# Patient Record
Sex: Male | Born: 1950
Health system: Southern US, Community
[De-identification: ages and names within clinical notes are randomized; demographics above are authoritative.]

## PROBLEM LIST (undated history)

## (undated) DIAGNOSIS — Z87828 Personal history of other (healed) physical injury and trauma: Secondary | ICD-10-CM

## (undated) DIAGNOSIS — I2119 ST elevation (STEMI) myocardial infarction involving other coronary artery of inferior wall: Secondary | ICD-10-CM

## (undated) DIAGNOSIS — R972 Elevated prostate specific antigen [PSA]: Secondary | ICD-10-CM

## (undated) DIAGNOSIS — M1612 Unilateral primary osteoarthritis, left hip: Secondary | ICD-10-CM

## (undated) DIAGNOSIS — I10 Essential (primary) hypertension: Secondary | ICD-10-CM

## (undated) DIAGNOSIS — E785 Hyperlipidemia, unspecified: Secondary | ICD-10-CM

## (undated) DIAGNOSIS — I2510 Atherosclerotic heart disease of native coronary artery without angina pectoris: Secondary | ICD-10-CM

## (undated) DIAGNOSIS — Z87442 Personal history of urinary calculi: Secondary | ICD-10-CM

## (undated) HISTORY — DX: Unilateral primary osteoarthritis, left hip: M16.12

## (undated) HISTORY — DX: Hyperlipidemia, unspecified: E78.5

## (undated) HISTORY — DX: Elevated prostate specific antigen (PSA): R97.20

## (undated) HISTORY — DX: Essential (primary) hypertension: I10

## (undated) HISTORY — DX: Personal history of urinary calculi: Z87.442

## (undated) HISTORY — DX: Personal history of other (healed) physical injury and trauma: Z87.828

## (undated) HISTORY — DX: Gilbert syndrome: E80.4

## (undated) HISTORY — PX: CARDIAC CATHETERIZATION: SHX172

---

## 2001-01-30 ENCOUNTER — Encounter: Payer: Self-pay | Admitting: Internal Medicine

## 2001-01-30 ENCOUNTER — Encounter: Admission: RE | Admit: 2001-01-30 | Discharge: 2001-01-30 | Payer: Self-pay | Admitting: Internal Medicine

## 2002-01-25 HISTORY — PX: TOTAL HIP ARTHROPLASTY: SHX124

## 2002-01-31 ENCOUNTER — Encounter: Payer: Self-pay | Admitting: Orthopedic Surgery

## 2002-02-08 ENCOUNTER — Inpatient Hospital Stay (HOSPITAL_COMMUNITY): Admission: RE | Admit: 2002-02-08 | Discharge: 2002-02-13 | Payer: Self-pay | Admitting: Orthopedic Surgery

## 2002-02-08 ENCOUNTER — Encounter: Payer: Self-pay | Admitting: Orthopedic Surgery

## 2002-02-09 ENCOUNTER — Encounter: Payer: Self-pay | Admitting: Orthopedic Surgery

## 2002-11-20 ENCOUNTER — Encounter: Payer: Self-pay | Admitting: Internal Medicine

## 2004-09-18 ENCOUNTER — Ambulatory Visit: Payer: Self-pay | Admitting: Internal Medicine

## 2004-09-25 ENCOUNTER — Ambulatory Visit: Payer: Self-pay | Admitting: Internal Medicine

## 2004-10-26 ENCOUNTER — Ambulatory Visit: Payer: Self-pay | Admitting: Internal Medicine

## 2004-10-30 ENCOUNTER — Ambulatory Visit: Payer: Self-pay | Admitting: Internal Medicine

## 2005-06-25 ENCOUNTER — Ambulatory Visit: Payer: Self-pay | Admitting: Internal Medicine

## 2006-01-25 HISTORY — PX: TOTAL HIP ARTHROPLASTY: SHX124

## 2006-07-26 ENCOUNTER — Encounter: Payer: Self-pay | Admitting: Internal Medicine

## 2006-08-02 ENCOUNTER — Encounter (INDEPENDENT_AMBULATORY_CARE_PROVIDER_SITE_OTHER): Payer: Self-pay | Admitting: *Deleted

## 2006-08-02 ENCOUNTER — Encounter: Payer: Self-pay | Admitting: Internal Medicine

## 2006-08-25 ENCOUNTER — Telehealth (INDEPENDENT_AMBULATORY_CARE_PROVIDER_SITE_OTHER): Payer: Self-pay | Admitting: *Deleted

## 2006-09-12 ENCOUNTER — Ambulatory Visit: Payer: Self-pay | Admitting: Internal Medicine

## 2006-09-12 LAB — CONVERTED CEMR LAB
BUN: 13 mg/dL (ref 6–23)
Cholesterol: 224 mg/dL (ref 0–200)
Eosinophils Relative: 1.9 % (ref 0.0–5.0)
HCT: 45.4 % (ref 39.0–52.0)
HDL: 33.2 mg/dL — ABNORMAL LOW (ref >39.0)
Hemoglobin: 15.7 g/dL (ref 13.0–17.0)
Lymphocytes Relative: 32.1 % (ref 12.0–46.0)
Neutro Abs: 3.4 10*3/uL (ref 1.4–7.7)
RBC: 4.84 M/uL (ref 4.22–5.81)
RDW: 12.6 % (ref 11.5–14.6)
TSH: 0.99 microintl units/mL (ref 0.35–5.50)
Total CHOL/HDL Ratio: 6.7
Triglycerides: 230 mg/dL (ref 0–149)
VLDL: 46 mg/dL — ABNORMAL HIGH (ref 0–40)
WBC: 5.8 10*3/uL (ref 4.5–10.5)

## 2006-09-16 ENCOUNTER — Encounter (INDEPENDENT_AMBULATORY_CARE_PROVIDER_SITE_OTHER): Payer: Self-pay | Admitting: *Deleted

## 2006-09-16 ENCOUNTER — Ambulatory Visit: Payer: Self-pay | Admitting: Internal Medicine

## 2006-09-16 DIAGNOSIS — R972 Elevated prostate specific antigen [PSA]: Secondary | ICD-10-CM

## 2006-09-16 DIAGNOSIS — M2550 Pain in unspecified joint: Secondary | ICD-10-CM | POA: Insufficient documentation

## 2006-09-16 LAB — CONVERTED CEMR LAB
Cholesterol, target level: 200 mg/dL
LDL Goal: 160 mg/dL

## 2006-10-13 ENCOUNTER — Encounter: Payer: Self-pay | Admitting: Internal Medicine

## 2006-10-21 ENCOUNTER — Telehealth (INDEPENDENT_AMBULATORY_CARE_PROVIDER_SITE_OTHER): Payer: Self-pay | Admitting: *Deleted

## 2006-12-12 ENCOUNTER — Telehealth: Payer: Self-pay | Admitting: Internal Medicine

## 2006-12-12 ENCOUNTER — Ambulatory Visit: Payer: Self-pay | Admitting: Internal Medicine

## 2006-12-12 ENCOUNTER — Encounter (INDEPENDENT_AMBULATORY_CARE_PROVIDER_SITE_OTHER): Payer: Self-pay | Admitting: *Deleted

## 2006-12-12 LAB — CONVERTED CEMR LAB
ALT: 33 units/L (ref 0–53)
AST: 24 units/L (ref 0–37)
Direct LDL: 101.5 mg/dL
HDL: 25.5 mg/dL — ABNORMAL LOW (ref >39.0)
Triglycerides: 272 mg/dL (ref 0–149)
VLDL: 54 mg/dL — ABNORMAL HIGH (ref 0–40)

## 2006-12-13 ENCOUNTER — Ambulatory Visit: Payer: Self-pay | Admitting: Internal Medicine

## 2006-12-13 DIAGNOSIS — E782 Mixed hyperlipidemia: Secondary | ICD-10-CM | POA: Insufficient documentation

## 2006-12-13 DIAGNOSIS — M169 Osteoarthritis of hip, unspecified: Secondary | ICD-10-CM | POA: Insufficient documentation

## 2006-12-13 DIAGNOSIS — M1610 Unilateral primary osteoarthritis, unspecified hip: Secondary | ICD-10-CM | POA: Insufficient documentation

## 2007-01-05 ENCOUNTER — Inpatient Hospital Stay (HOSPITAL_COMMUNITY): Admission: RE | Admit: 2007-01-05 | Discharge: 2007-01-08 | Payer: Self-pay | Admitting: Orthopedic Surgery

## 2007-01-24 ENCOUNTER — Ambulatory Visit: Payer: Self-pay | Admitting: Internal Medicine

## 2007-01-24 LAB — CONVERTED CEMR LAB
Ketones, ur: NEGATIVE mg/dL
Protein, ur: NEGATIVE mg/dL
RBC / HPF: NONE SEEN (ref <3)
Urobilinogen, UA: 0.2 (ref 0.0–1.0)

## 2007-01-25 ENCOUNTER — Encounter: Payer: Self-pay | Admitting: Internal Medicine

## 2007-01-26 DIAGNOSIS — Z87828 Personal history of other (healed) physical injury and trauma: Secondary | ICD-10-CM

## 2007-01-26 HISTORY — DX: Personal history of other (healed) physical injury and trauma: Z87.828

## 2007-01-26 HISTORY — PX: LITHOTRIPSY: SUR834

## 2007-02-01 ENCOUNTER — Telehealth (INDEPENDENT_AMBULATORY_CARE_PROVIDER_SITE_OTHER): Payer: Self-pay | Admitting: *Deleted

## 2007-07-31 ENCOUNTER — Telehealth (INDEPENDENT_AMBULATORY_CARE_PROVIDER_SITE_OTHER): Payer: Self-pay | Admitting: *Deleted

## 2007-10-09 ENCOUNTER — Telehealth (INDEPENDENT_AMBULATORY_CARE_PROVIDER_SITE_OTHER): Payer: Self-pay | Admitting: *Deleted

## 2007-10-17 ENCOUNTER — Ambulatory Visit: Payer: Self-pay | Admitting: Internal Medicine

## 2007-10-17 DIAGNOSIS — F29 Unspecified psychosis not due to a substance or known physiological condition: Secondary | ICD-10-CM | POA: Insufficient documentation

## 2007-10-17 DIAGNOSIS — T6701XA Heatstroke and sunstroke, initial encounter: Secondary | ICD-10-CM

## 2007-10-17 DIAGNOSIS — I10 Essential (primary) hypertension: Secondary | ICD-10-CM

## 2007-10-18 ENCOUNTER — Encounter (INDEPENDENT_AMBULATORY_CARE_PROVIDER_SITE_OTHER): Payer: Self-pay | Admitting: *Deleted

## 2007-10-18 ENCOUNTER — Ambulatory Visit: Payer: Self-pay | Admitting: Internal Medicine

## 2007-10-19 ENCOUNTER — Ambulatory Visit: Payer: Self-pay | Admitting: Cardiology

## 2007-10-22 ENCOUNTER — Encounter (INDEPENDENT_AMBULATORY_CARE_PROVIDER_SITE_OTHER): Payer: Self-pay | Admitting: *Deleted

## 2007-10-22 LAB — CONVERTED CEMR LAB
ALT: 25 units/L (ref 0–53)
Albumin: 4.3 g/dL (ref 3.5–5.2)
Basophils Relative: 0.5 % (ref 0.0–3.0)
Cholesterol: 172 mg/dL (ref 0–200)
Eosinophils Absolute: 0.1 10*3/uL (ref 0.0–0.7)
Eosinophils Relative: 1.8 % (ref 0.0–5.0)
GFR calc non Af Amer: 92 mL/min
Glucose, Bld: 104 mg/dL — ABNORMAL HIGH (ref 70–99)
HCT: 46.3 % (ref 39.0–52.0)
HDL: 29.9 mg/dL — ABNORMAL LOW (ref >39.0)
MCV: 97.5 fL (ref 78.0–100.0)
Monocytes Relative: 6.9 % (ref 3.0–12.0)
Potassium: 4.7 meq/L (ref 3.5–5.1)
RBC: 4.75 M/uL (ref 4.22–5.81)
VLDL: 35 mg/dL (ref 0–40)

## 2007-10-23 ENCOUNTER — Telehealth: Payer: Self-pay | Admitting: Internal Medicine

## 2007-10-30 ENCOUNTER — Ambulatory Visit: Payer: Self-pay | Admitting: Internal Medicine

## 2007-10-30 DIAGNOSIS — R17 Unspecified jaundice: Secondary | ICD-10-CM | POA: Insufficient documentation

## 2007-10-30 DIAGNOSIS — L299 Pruritus, unspecified: Secondary | ICD-10-CM | POA: Insufficient documentation

## 2007-10-30 LAB — CONVERTED CEMR LAB
Bilirubin Urine: NEGATIVE
Ketones, urine, test strip: NEGATIVE
Specific Gravity, Urine: 1.02
WBC Urine, dipstick: NEGATIVE
pH: 5

## 2007-10-31 ENCOUNTER — Encounter (INDEPENDENT_AMBULATORY_CARE_PROVIDER_SITE_OTHER): Payer: Self-pay | Admitting: *Deleted

## 2007-10-31 LAB — CONVERTED CEMR LAB
ALT: 199 units/L — ABNORMAL HIGH (ref 0–53)
Albumin: 4 g/dL (ref 3.5–5.2)
Alkaline Phosphatase: 154 units/L — ABNORMAL HIGH (ref 39–117)
Basophils Absolute: 0 10*3/uL (ref 0.0–0.1)
Eosinophils Relative: 2 % (ref 0.0–5.0)
Hemoglobin: 14.6 g/dL (ref 13.0–17.0)
Hep A IgM: NEGATIVE
Monocytes Absolute: 0.7 10*3/uL (ref 0.1–1.0)
Monocytes Relative: 11 % (ref 3.0–12.0)
Neutrophils Relative %: 56 % (ref 43.0–77.0)
Platelets: 325 10*3/uL (ref 150–400)
Total Bilirubin: 3.6 mg/dL — ABNORMAL HIGH (ref 0.3–1.2)
Total Protein: 6.9 g/dL (ref 6.0–8.3)

## 2007-11-01 ENCOUNTER — Telehealth (INDEPENDENT_AMBULATORY_CARE_PROVIDER_SITE_OTHER): Payer: Self-pay | Admitting: *Deleted

## 2007-11-02 ENCOUNTER — Telehealth (INDEPENDENT_AMBULATORY_CARE_PROVIDER_SITE_OTHER): Payer: Self-pay | Admitting: *Deleted

## 2007-11-03 ENCOUNTER — Encounter: Admission: RE | Admit: 2007-11-03 | Discharge: 2007-11-03 | Payer: Self-pay | Admitting: Internal Medicine

## 2007-11-06 ENCOUNTER — Ambulatory Visit: Payer: Self-pay | Admitting: Internal Medicine

## 2007-11-08 ENCOUNTER — Encounter (INDEPENDENT_AMBULATORY_CARE_PROVIDER_SITE_OTHER): Payer: Self-pay | Admitting: *Deleted

## 2007-11-08 DIAGNOSIS — K8020 Calculus of gallbladder without cholecystitis without obstruction: Secondary | ICD-10-CM | POA: Insufficient documentation

## 2007-11-08 LAB — CONVERTED CEMR LAB
ALT: 91 units/L — ABNORMAL HIGH (ref 0–53)
AST: 41 units/L — ABNORMAL HIGH (ref 0–37)
Alkaline Phosphatase: 95 units/L (ref 39–117)
Lipase: 24 units/L (ref 11.0–59.0)

## 2007-11-09 ENCOUNTER — Telehealth (INDEPENDENT_AMBULATORY_CARE_PROVIDER_SITE_OTHER): Payer: Self-pay | Admitting: *Deleted

## 2007-11-09 ENCOUNTER — Encounter (INDEPENDENT_AMBULATORY_CARE_PROVIDER_SITE_OTHER): Payer: Self-pay | Admitting: *Deleted

## 2007-11-13 ENCOUNTER — Encounter: Payer: Self-pay | Admitting: Internal Medicine

## 2007-11-27 ENCOUNTER — Encounter: Payer: Self-pay | Admitting: Internal Medicine

## 2008-01-26 HISTORY — PX: COLONOSCOPY: SHX174

## 2008-10-15 ENCOUNTER — Telehealth (INDEPENDENT_AMBULATORY_CARE_PROVIDER_SITE_OTHER): Payer: Self-pay | Admitting: *Deleted

## 2008-10-18 ENCOUNTER — Ambulatory Visit: Payer: Self-pay | Admitting: Internal Medicine

## 2008-10-21 LAB — CONVERTED CEMR LAB
AST: 27 units/L (ref 0–37)
Alkaline Phosphatase: 48 units/L (ref 39–117)
BUN: 17 mg/dL (ref 6–23)
Basophils Absolute: 0 10*3/uL (ref 0.0–0.1)
Bilirubin, Direct: 0.1 mg/dL (ref 0.0–0.3)
Calcium: 9.2 mg/dL (ref 8.4–10.5)
Cholesterol: 193 mg/dL (ref 0–200)
Creatinine, Ser: 1 mg/dL (ref 0.4–1.5)
Eosinophils Relative: 1.8 % (ref 0.0–5.0)
HCT: 48.3 % (ref 39.0–52.0)
MCV: 97.9 fL (ref 78.0–100.0)
Monocytes Relative: 8.5 % (ref 3.0–12.0)
Neutrophils Relative %: 55.5 % (ref 43.0–77.0)
RBC: 4.93 M/uL (ref 4.22–5.81)
RDW: 12.2 % (ref 11.5–14.6)
Total CHOL/HDL Ratio: 6
Total Protein: 7.6 g/dL (ref 6.0–8.3)

## 2008-10-22 ENCOUNTER — Ambulatory Visit: Payer: Self-pay | Admitting: Internal Medicine

## 2008-10-22 ENCOUNTER — Encounter (INDEPENDENT_AMBULATORY_CARE_PROVIDER_SITE_OTHER): Payer: Self-pay | Admitting: *Deleted

## 2008-10-22 DIAGNOSIS — Z87442 Personal history of urinary calculi: Secondary | ICD-10-CM | POA: Insufficient documentation

## 2008-10-22 LAB — CONVERTED CEMR LAB: Hgb A1c MFr Bld: 5.5 % (ref 4.6–6.5)

## 2009-01-01 ENCOUNTER — Ambulatory Visit: Payer: Self-pay | Admitting: Internal Medicine

## 2009-01-25 HISTORY — PX: COLONOSCOPY: SHX174

## 2009-02-24 ENCOUNTER — Ambulatory Visit: Payer: Self-pay | Admitting: Internal Medicine

## 2009-03-03 LAB — CONVERTED CEMR LAB
AST: 29 units/L (ref 0–37)
Albumin: 4.5 g/dL (ref 3.5–5.2)
Bilirubin, Direct: 0.1 mg/dL (ref 0.0–0.3)
Cholesterol: 166 mg/dL (ref 0–200)
HDL: 37.4 mg/dL — ABNORMAL LOW (ref >39.00)
Total Bilirubin: 1.2 mg/dL (ref 0.3–1.2)
Total Protein: 7 g/dL (ref 6.0–8.3)
Triglycerides: 224 mg/dL — ABNORMAL HIGH (ref 0.0–149.0)

## 2009-07-24 ENCOUNTER — Ambulatory Visit: Payer: Self-pay | Admitting: Internal Medicine

## 2009-07-29 ENCOUNTER — Encounter: Payer: Self-pay | Admitting: Internal Medicine

## 2009-08-06 ENCOUNTER — Telehealth: Payer: Self-pay | Admitting: Internal Medicine

## 2009-10-15 ENCOUNTER — Ambulatory Visit: Payer: Self-pay | Admitting: Internal Medicine

## 2009-12-02 ENCOUNTER — Telehealth (INDEPENDENT_AMBULATORY_CARE_PROVIDER_SITE_OTHER): Payer: Self-pay | Admitting: *Deleted

## 2009-12-11 ENCOUNTER — Encounter: Payer: Self-pay | Admitting: Gastroenterology

## 2009-12-23 ENCOUNTER — Encounter (INDEPENDENT_AMBULATORY_CARE_PROVIDER_SITE_OTHER): Payer: Self-pay | Admitting: *Deleted

## 2009-12-24 ENCOUNTER — Ambulatory Visit: Payer: Self-pay | Admitting: Internal Medicine

## 2010-01-09 ENCOUNTER — Ambulatory Visit: Payer: Self-pay | Admitting: Internal Medicine

## 2010-02-24 NOTE — Assessment & Plan Note (Signed)
Summary: NEEDS CRESTOR REFILL///SPH   Vital Signs:  Patient profile:   60 year old male Weight:      211.2 pounds Temp:     97.9 degrees F oral Pulse rate:   72 / minute Resp:     12 per minute BP sitting:   122 / 68  (left arm) Cuff size:   large  Vitals Entered By: Shonna Chock CMA (October 15, 2009 3:25 PM) CC: Address Crestor, Lipid Management   CC:  Address Crestor and Lipid Management.  History of Present Illness: Hyperlipidemia Follow-Up      This is a 60 year old man who presents for Hyperlipidemia follow-up.  The patient reports muscle aches related to work, but denies GI upset, abdominal pain, flushing, itching, diarrhea, and fatigue.  Other symptoms include hand  edema from repetitive hand movement.  The patient denies the following symptoms: chest pain/pressure, exercise intolerance, dypsnea, palpitations, and syncope.  Compliance with medications (by patient report) has been near 100%.  Dietary compliance has been excellent; he consumes < 45 grams of sugar / day.  The patient reports exercising daily.  Adjunctive measures currently used by the patient include ASA and fish oil supplements.  NMR reviewed & risks discussed.  Lipid Management History:      Positive NCEP/ATP III risk factors include male age 35 years old or older, HDL cholesterol less than 40, and hypertension.  Negative NCEP/ATP III risk factors include non-diabetic, no family history for ischemic heart disease, non-tobacco-user status, no ASHD (atherosclerotic heart disease), no prior stroke/TIA, no peripheral vascular disease, and no history of aortic aneurysm.     Current Medications (verified): 1)  Lisinopril 20 Mg  Tabs (Lisinopril) .Marland Kitchen.. 1 By Mouth Every Other Day 2)  Ibuprophen .Marland Kitchen.. 3 To 6 Tabs Daily 3)  Multivitamin 4)  Vit B Complex 5)  Fish Oil 6)  Lisinopril 10 Mg Tabs (Lisinopril) .Marland Kitchen.. 1 By Mouth Every Other Day 7)  Crestor 10 Mg Tabs (Rosuvastatin Calcium) .Marland Kitchen.. 1 Pill Every  Weds  Allergies: 1)  ! * Aleve  Past History:  Past Medical History: Hyperlipidemia:NMR Lipoprofile 06/2009: LDL 36(6440/347), HDL 40, TG 127. Framingham Study LDL goal = < 130. Hypertension Gilbert's Syndrome; Elevated PSA Nephrolithiasis, hx of  Review of Systems MS:  Complains of joint pain and low back pain; denies joint redness, joint swelling, mid back pain, and thoracic pain; Ice & NSAIDS effective. Neuro:  Benign intention tremor.  Physical Exam  General:  well-nourished; alert,appropriate and cooperative throughout examination Lungs:  Normal respiratory effort, chest expands symmetrically. Lungs are clear to auscultation, no crackles or wheezes. Heart:  Normal rate and regular rhythm. S1 and S2 normal without gallop, murmur, click, rub .S4 Pulses:  R and L carotid,radial,dorsalis pedis and posterior tibial pulses are full and equal bilaterally Extremities:  No clubbing, cyanosis, edema.OA hand changes Psych:  Focused & intelligent   Impression & Recommendations:  Problem # 1:  HYPERLIPIDEMIA (ICD-272.2)  His updated medication list for this problem includes:    Crestor 10 Mg Tabs (Rosuvastatin calcium) .Marland Kitchen... Daily as directed  Complete Medication List: 1)  Lisinopril 20 Mg Tabs (Lisinopril) .Marland Kitchen.. 1 by mouth every other day 2)  Ibuprophen  .Marland Kitchen.. 3 to 6 tabs daily 3)  Multivitamin  4)  Vit B Complex  5)  Fish Oil  6)  Lisinopril 10 Mg Tabs (Lisinopril) .Marland Kitchen.. 1 by mouth every other day 7)  Crestor 10 Mg Tabs (Rosuvastatin calcium) .... Daily as directed  Lipid Assessment/Plan:  Based on NCEP/ATP III, the patient's risk factor category is "2 or more risk factors and a calculated 10 year CAD risk of < 20%".  The patient's lipid goals are as follows: Total cholesterol goal is 200; LDL cholesterol goal is 90; HDL cholesterol goal is 40; Triglyceride goal is 150.  His LDL cholesterol goal has been met.  Secondary causes for hyperlipidemia have been ruled out.  He has  been counseled on adjunctive measures for lowering his cholesterol and has been provided with dietary instructions.    Patient Instructions: 1)  Monitor lipids annually. 2)  Schedule a colonoscopy  to help detect colon cancer as per [facility] as discussed. Prescriptions: CRESTOR 10 MG TABS (ROSUVASTATIN CALCIUM) daily as directed  #30 x 1   Entered and Authorized by:   Marga Melnick MD   Signed by:   Marga Melnick MD on 10/15/2009   Method used:   Print then Give to Patient   RxID:   [id number]

## 2010-02-24 NOTE — Progress Notes (Signed)
Summary: CT/Lab Results  Phone Note Call from Patient Call back at [phone]   Caller: Patient Summary of Call: Patient Called earlier today requesting results of labs and CT. Patient aware I will Return his call to address results. Initial call taken by: Shonna Chock,  October 23, 2007 4:08 PM  Follow-up for Phone Call        DR.Tereasa Yilmaz SPOKE WITH PATIENT Follow-up by: Shonna Chock,  October 23, 2007 5:19 PM  Additional Follow-up for Phone Call Additional follow up Details #1::        Neurology consult recommended; no MRI as he has pin in clavicle Additional Follow-up by: Marga Melnick MD,  October 24, 2007 7:26 AM

## 2010-02-24 NOTE — Progress Notes (Signed)
Summary: resutls  Phone Note Call from Patient Call back at Home Phone [phone] Call back at [phone]   Summary of Call: Patient calling for test results from 2 weeks ago. I guess he means those from [employer]. Please advise. Initial call taken by: Lucious Groves,  August 06, 2009 12:56 PM  Follow-up for Phone Call        excellent report; original will be mailed. No change; monitor annually. Follow-up by: Marga Melnick MD,  August 06, 2009 1:46 PM  Additional Follow-up for Phone Call Additional follow up Details #1::        left message on voicemail notifying patient. Additional Follow-up by: Lucious Groves CMA,  August 06, 2009 2:26 PM

## 2010-02-24 NOTE — Progress Notes (Signed)
Summary: referral  Phone Note Call from Patient Call back at [phone] OR [phone]   Caller: Patient Summary of Call: Pt is requesting a referral for a colonoscopy. Initial call taken by: Lavell Islam,  December 02, 2009 9:03 AM  Follow-up for Phone Call        Order placed Follow-up by: Shonna Chock CMA,  December 02, 2009 3:11 PM

## 2010-02-24 NOTE — Miscellaneous (Signed)
Summary: [facility]  Clinical Lists Changes  Medications: Added new medication of MOVIPREP 100 GM  SOLR (PEG-KCL-NACL-NASULF-NA ASC-C) As per prep instructions. - Signed Rx of MOVIPREP 100 GM  SOLR (PEG-KCL-NACL-NASULF-NA ASC-C) As per prep instructions.;  #1 x 0;  Signed;  Entered by: Ezra Sites RN;  Authorized by: Hart Carwin MD;  Method used: Electronically to [facility] [address] [place], [place], [place]  [address], Ph: [phone], Fax: [phone] Allergies: Changed allergy or adverse reaction from * ALEVE to * ALEVE    Prescriptions: MOVIPREP 100 GM  SOLR (PEG-KCL-NACL-NASULF-NA ASC-C) As per prep instructions.  #1 x 0   Entered by:   Ezra Sites RN   Authorized by:   Hart Carwin MD   Signed by:   Ezra Sites RN on 12/24/2009   Method used:   Electronically to        [facility] (retail)       [address]       [place], [place]  [address]       Ph: [phone]       Fax: [phone]   RxID:   [id number]

## 2010-02-24 NOTE — Letter (Signed)
Summary: [facility] Instructions  [facility]  [address] [place], [place] [address]   Phone: [phone]  Fax: [phone]       Mike Ray    Nov 21, 1950    MRN: [id number]        Procedure Day /Date:  Friday 01/09/2010     Arrival Time:  2:00 pm      Procedure Time:  3:00 pm     Location of Procedure:                    _x _  [facility] (4th Floor)    PREPARATION FOR COLONOSCOPY WITH MOVIPREP   Starting 5 days prior to your procedure Sunday 12/11 do not eat nuts, seeds, popcorn, corn, beans, peas,  salads, or any raw vegetables.  Do not take any fiber supplements (e.g. Metamucil, Citrucel, and Benefiber).  THE DAY BEFORE YOUR PROCEDURE         DATE: Thursday 12/16 1.  Drink clear liquids the entire day-NO SOLID FOOD  2.  Do not drink anything colored red or purple.  Avoid juices with pulp.  No orange juice.  3.  Drink at least 64 oz. (8 glasses) of fluid/clear liquids during the day to prevent dehydration and help the prep work efficiently.  CLEAR LIQUIDS INCLUDE: Water Jello Ice Popsicles Tea (sugar ok, no milk/cream) Powdered fruit flavored drinks Coffee (sugar ok, no milk/cream) Gatorade Juice: apple, white grape, white cranberry  Lemonade Clear bullion, consomm, broth Carbonated beverages (any kind) Strained chicken noodle soup Hard Candy                             4.  In the morning, mix first dose of MoviPrep solution:    Empty 1 Pouch A and 1 Pouch B into the disposable container    Add lukewarm drinking water to the top line of the container. Mix to dissolve    Refrigerate (mixed solution should be used within 24 hrs)  5.  Begin drinking the prep at 5:00 p.m. The MoviPrep container is divided by 4 marks.   Every 15 minutes drink the solution down to the next mark (approximately 8 oz) until the full liter is complete.   6.  Follow completed prep with 16 oz of clear liquid of your choice (Nothing red or purple).   Continue to drink clear liquids until bedtime.  7.  Before going to bed, mix second dose of MoviPrep solution:    Empty 1 Pouch A and 1 Pouch B into the disposable container    Add lukewarm drinking water to the top line of the container. Mix to dissolve    Refrigerate  THE DAY OF YOUR PROCEDURE      DATE: Friday 12/16  Beginning at 10:00 a.m. (5 hours before procedure):         1. Every 15 minutes, drink the solution down to the next mark (approx 8 oz) until the full liter is complete.  2. Follow completed prep with 16 oz. of clear liquid of your choice.    3. You may drink clear liquids until 1:00 pm (2 HOURS BEFORE PROCEDURE).   MEDICATION INSTRUCTIONS  Unless otherwise instructed, you should take regular prescription medications with a small sip of water   as early as possible the morning of your procedure.           OTHER INSTRUCTIONS  You will need a  responsible adult at least 60 years of age to accompany you and drive you home.   This person must remain in the waiting room during your procedure.  Wear loose fitting clothing that is easily removed.  Leave jewelry and other valuables at home.  However, you may wish to bring a book to read or  an iPod/MP3 player to listen to music as you wait for your procedure to start.  Remove all body piercing jewelry and leave at home.  Total time from sign-in until discharge is approximately 2-3 hours.  You should go home directly after your procedure and rest.  You can resume normal activities the  day after your procedure.  The day of your procedure you should not:   Drive   Make legal decisions   Operate machinery   Drink alcohol   Return to work  You will receive specific instructions about eating, activities and medications before you leave.    The above instructions have been reviewed and explained to me by   Ezra Sites RN  December 24, 2009 8:52 AM     I fully understand and can verbalize these  instructions _____________________________ Date _________

## 2010-02-24 NOTE — Letter (Signed)
Summary: New Patient letter  [facility]  [address] [place], [place] [address]   Phone: [phone]  Fax: [phone]       12/11/2009 MRN: [id number]  Mike Ray [address] [place], [place]  [address]  Dear Mr. Mike Ray,  Welcome to the Gastroenterology Division at [facility].    You are scheduled to see Dr.  Christella Hartigan on 12-24-09 at 10am on the 3rd floor at [facility], [address]  We ask that you try to arrive at our office 15 minutes prior to your appointment time to allow for check-in.  We would like you to complete the enclosed self-administered evaluation form prior to your visit and bring it with you on the day of your appointment.  We will review it with you.  Also, please bring a complete list of all your medications or, if you prefer, bring the medication bottles and we will list them.  Please bring your insurance card so that we may make a copy of it.  If your insurance requires a referral to see a specialist, please bring your referral form from your primary care physician.  Co-payments are due at the time of your visit and may be paid by cash, check or credit card.     Your office visit will consist of a consult with your physician (includes a physical exam), any laboratory testing he/she may order, scheduling of any necessary diagnostic testing (e.g. x-ray, ultrasound, CT-scan), and scheduling of a procedure (e.g. Endoscopy, Colonoscopy) if required.  Please allow enough time on your schedule to allow for any/all of these possibilities.    If you cannot keep your appointment, please call [phone] to cancel or reschedule prior to your appointment date.  This allows [place] the opportunity to schedule an appointment for another patient in need of care.  If you do not cancel or reschedule by 5 p.m. the business day prior to your appointment date, you will be charged a $50.00 late cancellation/no-show fee.    Thank you for choosing [facility] for your medical needs.  We appreciate the opportunity to care for you.  Please visit [place] at our website  to learn more about our practice.                     Sincerely,                                                             The Gastroenterology Division

## 2010-02-26 NOTE — Procedures (Signed)
Summary: Colonoscopy  Patient: [name] Note: All result statuses are Final unless otherwise noted.  Tests: (1) Colonoscopy (COL)   COL Colonoscopy           DONE     [facility]     [address]     [place], [place]  [address]           COLONOSCOPY PROCEDURE REPORT           PATIENT:  Mike Ray, Mike Ray  MR#:  [id number]     BIRTHDATE:  01/22/1951, 59 yrs. old  GENDER:  male     ENDOSCOPIST:  Hedwig Morton. Juanda Chance, MD     REF. BY:  Marga Melnick, M.D.     PROCEDURE DATE:  01/09/2010     PROCEDURE:  Colonoscopy 19147     ASA CLASS:  Class I     INDICATIONS:  Routine Risk Screening     MEDICATIONS:   Versed 10 mg, Fentanyl 100 mcg           DESCRIPTION OF PROCEDURE:   After the risks benefits and     alternatives of the procedure were thoroughly explained, informed     consent was obtained.  Digital rectal exam was performed and     revealed no rectal masses.   The LB 180AL [id number] endoscope was     introduced through the anus and advanced to the cecum, which was     identified by both the appendix and ileocecal valve, without     limitations.  The quality of the prep was good, using MoviPrep.     The instrument was then slowly withdrawn as the colon was fully     examined.     <<PROCEDUREIMAGES>>           FINDINGS:  Moderate diverticulosis was found in the sigmoid colon     (see image1, image7, and image8).   Retroflexed views in the     rectum revealed no abnormalities.    The scope was then withdrawn     from the patient and the procedure completed.           COMPLICATIONS:  None     ENDOSCOPIC IMPRESSION:     1) Moderate diverticulosis in the sigmoid colon     RECOMMENDATIONS:     1) high fiber diet     REPEAT EXAM:  In 10 year(s) for.           ______________________________     Hedwig Morton. Juanda Chance, MD           CC:           n.     eSIGNED:   Hedwig Morton. Brodie at 01/09/2010 03:39 PM           Janace Hoard, [phone]  Note: An exclamation mark (!)  indicates a result that was not dispersed into the flowsheet. Document Creation Date: 01/09/2010 3:39 PM _______________________________________________________________________  (1) Order result status: Final Collection or observation date-time: 01/09/2010 15:33 Requested date-time:  Receipt date-time:  Reported date-time:  Referring Physician:   Ordering Physician: Lina Sar [phone]) Specimen Source:  Source: Launa Grill Order Number: [id number] Lab site:   Appended Document: Colonoscopy    Clinical Lists Changes  Observations: Added new observation of COLONNXTDUE: 01/10/2020 (01/09/2010 15:52)

## 2010-06-09 NOTE — Op Note (Signed)
Mike Ray, Mike Ray             ACCOUNT NO.:  [id number]   MEDICAL RECORD NO.:  [id number]          PATIENT TYPE:  INP   LOCATION:  NA                           FACILITY:  [facility]   PHYSICIAN:  Georges Lynch. Gioffre, M.D.DATE OF BIRTH:  12-21-1950   DATE OF PROCEDURE:  01/05/2007  DATE OF DISCHARGE:                               OPERATIVE REPORT   SURGEON:  Georges Lynch. Gioffre, M.D.   ASSISTANTS:  1. Madlyn Frankel Charlann Boxer, M.D.  2. Jamelle Rushing,  P.A.   PREOPERATIVE DIAGNOSIS:  Severe degenerative arthritis, with shortening  of the left lower extremity.  His arthritis involved the left hip.   POSTOPERATIVE DIAGNOSIS:  Severe degenerative arthritis, with shortening  of the left lower extremity.  His arthritis involved the left hip.   OPERATION:  Left total hip arthroplasty utilizing the DePuy system.  We  utilized the porous coated system.  The sizes used:  The acetabular cup  was an ASR size 54.  The femoral head was a metal femoral head, size 47.  The femoral stem was a size 6.  The tapered sleeve was a size 2, and we  utilized a high-offset femoral stem.   PROCEDURE:  Under general anesthesia, routine orthopedic prep and  draping of  the left lower extremity was carried out.  He was placed on  the right side, left side up.  He had 2 g of IV Ancef preop.  A  posterolateral approach to the hip was carried out on the left.  Bleeders were identified and cauterized.  Following that, self-retaining  tractors were inserted.  I then incised the iliotibial band.  I extended  the incision up proximally through the gluteus and dissected the gluteus  by blunt dissection.  Bleeders were identified and cauterized.  Following that, I then identified the external rotators, and I partially  detached those.  I then went down and protected the sciatic nerve in the  usual fashion.  I then went down and excised the capsule.  I did a  complete capsulectomy posteriorly.  I then dislocated the femoral  head.  I measured appropriate neck length with the measuring device.  I noted  he was about 1/2 inch short preop.  Following that, I then made my  initial saw cut, at the same time protect the underlying soft tissues.  We then inserted our canal finder and then rasped the shaft up to a size  6 stem.  We kept as much anteversion in the rasps as we could at the  time of the rasping.  After the femoral shaft was prepared, we then  completed the capsulectomy, removed the soft tissue from the socket.  We  then went down reamed the acetabulum up to a size 53 for a 54 mm cup.  Following that, we inserted our ASR cup, size 54 mm diameter.  The cup  was solid.  Following that, we then completed our femoral side by  inserting our size 6 high-offset femoral stem, porous coated.  We then  went through trials and finally selected a +2 tapered sleeve, and the  ball  size was a size 47 mm metal ball.  We then inserted our permanent  components, reduced the hip after we made sure the acetabular cup was  clear.  We had excellent range of  motion and excellent function.  Leg length measurements were excellent.  We thoroughly irrigated out the area and closed the wound layers in the  usual fashion.  Skin was closed with metal staples.  A sterile Neosporin  dressing was applied.  The patient left the operating room in  satisfactory condition.           ______________________________  Georges Lynch Darrelyn Hillock, M.D.     RAG/MEDQ  D:  01/05/2007  T:  01/06/2007  Job:  [id number]

## 2010-06-12 NOTE — Op Note (Signed)
NAME:  Mike Ray, Mike Ray                       ACCOUNT NO.:  [id number]   MEDICAL RECORD NO.:  [id number]                   PATIENT TYPE:  INP   LOCATION:  0008                                 FACILITY:  [facility]   PHYSICIAN:  Georges Lynch. Darrelyn Hillock, M.D.             DATE OF BIRTH:  03/03/1950   DATE OF PROCEDURE:  02/08/2002  DATE OF DISCHARGE:                                 OPERATIVE REPORT   SURGEON:  Georges Lynch. Darrelyn Hillock, M.D.   ASSISTANT:  Ronnell Guadalajara, M.D. and Ebbie Ridge. Paitsel, P.A.   PREOPERATIVE DIAGNOSES:  Severe degenerative arthritis with shortening of  his right lower extremity. The arthritis involved the right hip. He was  about a 1/2 inch shorter on the right starting out.   POSTOPERATIVE DIAGNOSES:  Severe degenerative arthritis with shortening of  his right lower extremity. The arthritis involved the right hip. He was  about a 1/2 inch shorter on the right starting out.   OPERATION:  A right total hip arthroplasty utilizing the [employer]onics system.   COMPONENTS USED:  I utilized a size 56 mm Trident cup. I utilized a 10  degree polyethylene insert. Two screws were used to fix the metal cup. I  utilized a size 10 SecureFit plus stem and a plus zero ceramic head. The  diameter of the head was 36 mm.   DESCRIPTION OF PROCEDURE:  Under general anesthesia routine orthopedic prep  and draping of the right hip was carried out. Posterolateral approach to the  right hip was carried out, bleeders identified and cauterized. At this time,  the incision was carried down through the iliotibial band. Following this,  self retaining retractors were advanced. I then detached the external  rotators and the Zickle band in the usual fashion. Once again great care was  taken to protect the sciatic nerve. Once the rotators were detached, I then  did a capsulectomy, dislocated the head and then amputated the femoral head  at the appropriate neck length. Following this, I then went on and  carried  out my reaming and rasping of the femoral shaft up to a size 10 SecureFit.  The distal tip was reamed to the appropriate size as well. After the femur  was prepared, we made our appropriate neck cut. We then went on and reamed  the acetabulum up to a size 56 mm acetabulum. He had a large calcific spur  that we removed anteriorly and then went down and reamed out the acetabulum  to a size 56 mm diameter. We then went through the trials in the usual  fashion for cup position and leg length. Once this was done, we inserted our  permanent 56 mm Trident cup with two screws for fixation purposes. I then  went on and inserted my trials again and went through trials again for leg  length. After this, I then removed the trial insert and inserted my  permanent insert at  the appropriate angle. We utilized a 10 degree  polyethylene insert. When this was done, we then inserted our SecureFit stem  for a size 10 stem. Once this was done we went through trials. I then  elected the plus zero ceramic head, so we utilized a polyethylene lining and  a plus zero ceramic head. The hip was reduced, we had excellent function and  excellent stability. In fact it took a large bone hook in order to remove  the head from the cup whenever we were going through the trial. We then  reapproximated the soft tissue structures in the usual fashion. The  remaining part of the wound was closed in the usual fashion, skin was closed  with metal staples. A sterile Neosporin dressing was applied. He had 1 gm of  IV Ancef preop.                                                Ronald A. Darrelyn Hillock, M.D.    RAG/MEDQ  D:  02/08/2002  T:  02/08/2002  Job:  [id number]

## 2010-06-12 NOTE — H&P (Signed)
Mike Ray, Mike Ray             ACCOUNT NO.:  [id number]   MEDICAL RECORD NO.:  [id number]        PATIENT TYPE:  LINP   LOCATION:                               FACILITY:  [facility]   PHYSICIAN:  Georges Lynch. Gioffre, M.D.DATE OF BIRTH:  February 06, 1950   DATE OF ADMISSION:  01/05/2007  DATE OF DISCHARGE:                              HISTORY & PHYSICAL   CHIEF COMPLAINT:  Painful lost range of motion, left hip.   HISTORY OF PRESENT ILLNESS:  The patient is a 60 year old gentleman who  had a right total hip arthroplasty by Dr. Darrelyn Hillock 5 years previous.  Just had been having progressively worsening pain with range of motion  of his left hip.  The patient has severe arthritic changes with collapse  and deformity of his left femoral head.  The patient has elected to  proceed with a total hip arthroplasty on the left.   ALLERGIES:  ALEVE causing severe stomach cramps.   CURRENT MEDICATIONS:  1. Ibuprofen 200 mg 3 to 9 times a day.  2. Aspirin 81 mg a day.  3. Amlodipine 5 mg a day.  4. Lisinopril 20 mg a day.  5. Crestor 10 one-half tablets once a week on Wednesdays.  6. Fish oil.  7. Vitamin B complex.  8. Multivitamins.   PAST MEDICAL HISTORY:  1,  Hypertension, well-controlled.  1. History of kidney stones; one episode earlier this summer.  2. Hypercholesterolemia.  3. History of right total hip arthroplasty.  4. History of postoperative urinary retention.   REVIEW OF SYSTEMS:  Negative for any neurologic complaints, pulmonary  complaints.  He did smoke up until about 7 years previous.   Negative any cardiac issues. EKG was last done in 2006.   Denies any problems with GI, GU other than the periodic kidney stones  from dehydration.   He denies any endocrine or hematologic issues.   PAST SURGICAL HISTORY:  1. A right total hip arthroplasty in 2004.  2. A clavicle pin in 1969.   Only complication with the above-mentioned surgical procedures is  significant urinary retention  after his total hip arthroplasty.   FAMILY MEDICAL HISTORY:  Mother is deceased at age 39 from age.  Father  is deceased at the age with 76 from an MI.  Brothers have prostate  cancer.   SOCIAL HISTORY:  The patient is married, lives with his wife, works in  Aeronautical engineer.  Presently does not smoke; stopped 7 years previously.  Has  about one drink per week.  Has 3 grown children.  Lives in a 1-story  house.   PHYSICAL EXAM:  VITALS:  Height 6 feet, weight is 225 pounds, blood  pressure is 130/82, pulse of 68, respirations 12, patient is afebrile.  GENERAL:  This is a healthy-appearing, a well-developed gentleman.  Conscious, alert and appropriate.  Appears to be a good historian.  He  ambulates with a slight left-sided limp.  Easily gets on and off the  exam table.  HEENT:  Head was normocephalic.  Pupils equal, round and reactive.  Gross hearing is intact.  NECK:  Supple.  No palpable lymphadenopathy.  Excellent range of motion  without any discomfort.  CHEST:  Lung sounds were clear and equal bilaterally.  HEART:  Regular rate and rhythm.  ABDOMEN:  Positive bowel sounds, soft, nontender.  EXTREMITIES:  Upper extremities had excellent range of motion with  shoulders, elbows and wrists.  Motor strength was 5/5.  Lower extremities:  Right hip had about 15 degrees of internal-external  rotation, full extension, flexion up to 120 degrees.  Left hip had full  extension, flexion up to 120 degrees, but he had 0 degrees of internal-  external rotation.  Both knees had full extension and good flexion  without any discomfort, and calves were soft, nontender.  Ankles had  good range of motion.  PERIPHERAL VASCULAR:  Carotid pulses were 2+, no bruits.  Radial pulses  were 2+.  He had 1+ posterior tibial pulses without any edema.  NEURO:  The patient was conscious, alert and appropriate, good  historian.  No gross neurologic defects noted.  BREASTS, RECTAL AND GU EXAMS:  Deferred at this  time.   IMPRESSION AND PLAN:  1. End-stage osteoarthritis left hip.  2. History of right total hip arthroplasty.  3. Hypertension.  4. History of kidney stones.  5. History of postoperative urinary retention.   PLAN:  The patient will be admitted to [facility] on January 05, 2007 for a planned left total hip arthroplasty by Dr. Darrelyn Hillock.  The  patient will undergo all routine labs and tests prior to this surgical  procedure.  The  patient had significant problems with urinary retention on his last  postop, so we may automatically put him on Flomax to prevent this at  this time.  The patient will proceed with a total hip arthroplasty.  He  had no other questions.      Jamelle Rushing, P.A.    ______________________________  Georges Lynch Darrelyn Hillock, M.D.    RWK/MEDQ  D:  12/19/2006  T:  12/19/2006  Job:  [id number]   cc:   Windy Fast A. Darrelyn Hillock, M.D.  Fax: [phone]

## 2010-06-12 NOTE — Discharge Summary (Signed)
NAMEREHAAN, VILORIA             ACCOUNT NO.:  [id number]   MEDICAL RECORD NO.:  [id number]          PATIENT TYPE:  INP   LOCATION:  1612                         FACILITY:  [facility]   PHYSICIAN:  Georges Lynch. Gioffre, M.D.DATE OF BIRTH:  [birth year]   DATE OF ADMISSION:  01/05/2007  DATE OF DISCHARGE:  01/08/2007                               DISCHARGE SUMMARY   ADMISSION DIAGNOSES:  1. End-stage osteoarthritis left hip.  2. History of right total hip arthroplasty.  3. History of hypertension.  4. History of kidney stones.  5. History of previous postoperative urinary retention.   DISCHARGE DIAGNOSES:  1. Left total hip arthroplasty.  2. Hypertension.  3. History of kidney stones.  4. History of perioperative urinary retention.   HISTORY OF PRESENT ILLNESS:  The patient is a 60 year old gentleman who  has a history of a right total hip arthroplasty by Dr. Darrelyn Hillock 5 years  previous.  The patient has progressively worsening left hip pain and he  has difficulty with range of motion and ambulation.  X-rays reveal  severe arthritic changes with deformity of the femoral head.  The  patient has elected to proceed with a total hip arthroplasty on the  left.   ALLERGIES:  ALEVE causing severe stomach cramps.   CURRENT MEDICATIONS:  1. Ibuprofen.  2. Aspirin.  3. Amlodipine.  4. Lisinopril.  5. Crestor.  6. Fish oil.  7. Vitamin B complex.  8. Multivitamins.   SURGICAL PROCEDURE:  On January 05, 2007, the patient was taken to the  OR by Dr. Worthy Rancher, assisted by Dr. Lajoyce Corners and Oneida Alar, PA-C.  Under general anesthesia the patient underwent a left total hip  arthroplasty.  Left femoral head was sent to pathology for evaluation.  There were no complications.  There was minimal blood loss.  The patient  had the following components implanted.  A size 6 high offset femoral  TriLock stem, a size +2 tapered sleeve adapter, size 54 acetabular cup  and size 47 Uni Femoral implant.   The patient tolerated the procedure  well.  The patient was transferred to the recovery room and then the  orthopedic floor on routine total hip protocol.   CONSULTS:  The following routine consults were requested of physical  therapy, case management, pharmacy for Coumadin dosing.   HOSPITAL COURSE:  On January 05, 2007, the patient was admitted to  [facility] under the care of Dr. Ranee Gosselin.  The patient  was taken to the OR where a left total hip arthroplasty was performed  without any complications.  The patient was transferred to the recovery  room and then to the orthopedic floor in good condition with IV pain  medicines, antibiotics and DVT prophylaxis to follow a total hip  protocol.  The patient then spent 3 days postoperative care on the  orthopedic floor in which the patient was able to wean off his IV  medications well.  The patient's wound remained benign for any signs of  infection.  His leg remained neuromotor vascularly intact.  The patient  progressed very nicely with physical therapy.  There were no  complications so the patient was discharged to home in good condition  with followup instructions.   LABS:  CBC on admission found WBCs 9.3, hemoglobin 15.1, hematocrit  43.1, platelets 274, H and H on date of discharge was 10.1 and 28.3.  INR was 2.0.  Routine chemistries on admission within normal limits.  Glucose was 105.  Estimated GFR greater than 60.  The patient's  urinalysis on preadmission was cloudy, moderate hemoglobin, 100 protein,  moderate leukocyte esterase, many bacteria and oxalate crystals.  He was  placed back on an antibiotic and he had been followed and monitored by  his urologist preoperatively and postoperatively for a large bladder  stone.   DISCHARGE INSTRUCTIONS:  Diet - no restrictions.  Activity - the patient  may shower and ambulate with the use of crutches.  The patient is to  maintain a 50% weightbearing status.  Wound care  - the patient is to  change his dressing daily.   FOLLOWUP:  The patient is to follow up with Dr. Darrelyn Hillock 2 weeks from  discharge.  The patient is to call [phone].  Verbal instructions with the patient, and he was supposed to follow up  with his urologist in a couple of weeks from surgery.   MEDICATIONS:  1. Percocet 10/650 1 tablet every 6 hours for pain if needed.  2. Robaxin 500 mg once every 6 hours for pain if needed.  3. Coumadin 5 mg once a day unless changed by [place] pharmacist.  4. Norvasc 5 mg a day.  5. Lisinopril 20 mg a day.  6. Crestor 10 mg 1/2 tablet every Wednesday.  7. Tylenol Arthritis p.r.n.  8. Aspirin to be stopped until done with Coumadin.  9. Multivitamins.  May restart 1 tablet a day.  10.Vitamin B complex 1 tablet a day.  11.Flax seed oil as previously noted.   DISCHARGE CONDITION:  The patient's condition upon discharge to home is  listed improved and good.      Jamelle Rushing, P.A.    ______________________________  Georges Lynch Darrelyn Hillock, M.D.    RWK/MEDQ  D:  02/01/2007  T:  02/01/2007  Job:  [id number]   cc:   Windy Fast A. Darrelyn Hillock, M.D.  Fax: [phone]

## 2010-06-12 NOTE — H&P (Signed)
NAME:  Mike Ray, Mike Ray                       ACCOUNT NO.:  [id number]   MEDICAL RECORD NO.:  [id number]                   PATIENT TYPE:  INP   LOCATION:  NA                                   FACILITY:  [facility]   PHYSICIAN:  Georges Lynch. Darrelyn Hillock, M.D.             DATE OF BIRTH:  17-Dec-1950   DATE OF ADMISSION:  DATE OF DISCHARGE:                                HISTORY & PHYSICAL   CHIEF COMPLAINT:  Right hip pain x3-4 years.   HISTORY OF PRESENT ILLNESS:  The patient has had right hip pain over the  past three to four years, worse over the past two to three months.  The  patient did not get any relief from his symptoms with nonsteroidal anti-  inflammatory medications.  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  The  patient has elected to proceed with a right total hip arthroplasty.   CURRENT MEDICATIONS:  Altace 10 mg daily.   ALLERGIES:  ALEVE causes GI upset.   PAST MEDICAL HISTORY:  1. Hypertension.  2. Hemorrhoids.  3. Kidney stones.  4. Osteoarthritis.  The patient had a fractured collar bone in 1970.   Primary care Lidya Mccalister is Titus Dubin. Alwyn Ren, M.D. [facility].   SOCIAL HISTORY:  Divorced.  Has three kids.  Occasional ETOH.  Quit smoking  November 2002.  The patient lives in a two-story dwelling with three steps  at the entrance.  Children will be primary care giver after surgery.   FAMILY HISTORY:  Mother had a stroke.  Father died of a heart attack at age  50.  Brother has been diagnosed with prostate cancer.   REVIEW OF SYSTEMS:  GENERAL:  Denies weight gain, fever, chills, fatigue.  HEENT:  Denies headache, visual changes, tinnitus, hearing loss, sore  throat.  CARDIOVASCULAR:  Denies chest pain, shortness of breath,  hypertension.  PULMONARY:  Denies dyspnea, wheezing, cough, sputum  production, hemoptysis.  GASTROINTESTINAL:  Denies dysphagia, nausea,  vomiting, hematemesis, abdominal pain.   GENITOURINARY:  Denies dysuria,  frequency, incontinence, hematuria.  ENDOCRINE:  Denies polyuria,  polydipsia, appetite change, heat or cold intolerance.  MUSCULOSKELETAL:  Extreme pain in right hip, worse with ambulation.  NEUROLOGIC:  Denies  dizziness, vertigo, syncope, seizure, or weakness.  SKIN:  Denies itching,  rashes, masses, or moles.   PHYSICAL EXAMINATION:  VITAL SIGNS:  Temperature 97, pulse 80, respirations  18, blood pressure 140/90 right arm sitting large cuff.  GENERAL:  Well-developed, well-nourished white male 60 years old in no acute  distress.  HEENT:  PERRL.  EOMs intact.  TMs intact.  Pharynx clear.  NECK:  Supple without masses.  No carotid bruits noted.  CHEST:  Clear to auscultation bilaterally.  No wheezing, rales, or rhonchi  noted.  HEART:  Regular rate and rhythm without murmur, rub, or gallop.  ABDOMEN:  Positive bowel  sounds, soft, flat, nontender.  No organomegaly or  abnormal masses.  EXTREMITIES:  Decreased range of motion of his right hip.  He does have an  antalgic gait.  Right leg is approximately one-half shorter than the left.  SKIN:  Warm and dry.   LABORATORIES:  X-ray of his right hip reveals severe degenerative arthritis  with multiple cysts.   IMPRESSION:  Severe degenerative arthritis right hip.   PLAN:  The patient will be admitted to [facility] February 08, 2002  for a right total hip arthroplasty.     Ebbie Ridge. Paitsel, P.A.                     Ronald A. Darrelyn Hillock, M.D.    UVO/ZDGU  D:  01/30/2002  T:  01/30/2002  Job:  [id number]

## 2010-06-12 NOTE — Discharge Summary (Signed)
NAME:  Mike Ray, Mike Ray                       ACCOUNT NO.:  [id number]   MEDICAL RECORD NO.:  [id number]                   PATIENT TYPE:  INP   LOCATION:  0478                                 FACILITY:  [facility]   PHYSICIAN:  Georges Lynch. Gioffre, M.D.             DATE OF BIRTH:  [birth year]   DATE OF ADMISSION:  02/08/2002  DATE OF DISCHARGE:  02/13/2002                                 DISCHARGE SUMMARY   ADMISSION DIAGNOSES:  1. Osteoarthritis, right hip.  2. Hypertension.  3. Hemorrhoids.  4. Kidney stones.   DISCHARGE DIAGNOSES:  1. Osteoarthritis, right hip, status post right total hip replacement.  2. Hypertension.  3. Hemorrhoids.  4. Kidney stones.   PROCEDURES:  The patient was taken to the operating room 02/08/02 to undergo  a right total hip arthroplasty.  Dr. Worthy Rancher was the surgeon.  Assistants Dr. Ronnell Guadalajara, M.D. and Ebbie Ridge. Paitsel, P.A.  The procedure  was done under general anesthesia.   CONSULTANTS:  None.   BRIEF HISTORY:  The patient is a 60 year old male who has had right hip pain  over the past 3-4 years.  The pain has increased over the past 2-3 months.  The patient was unable to get any relief from his symptoms with nonsteroid  anti-inflammatory medications.  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  The x-ray of his right hip revealed severe degenerative arthritis with  multiple bone cysts.  The patient elected to proceed with the right total  hip arthroplasty.   LABORATORY DATA:  Preadmission CBC had WBC of 5.6, hemoglobin 16.1,  hematocrit 46.3, platelets 260.  Preadmission chemistry within normal  limits, except for elevated liver enzyme.  ALT 46, slightly high.  Preoperative urinalysis was normal.  The patient's blood type was O+.  Negative antibody screen.  Preoperative chest x-ray revealed an old right  clavicular fracture, but no active disease.  Preoperative  right hip x-ray  revealed severe bilateral osteoarthritis of the hip with deformity of the  femoral head consistent with avascular necrosis.  Postoperative right hip x-  ray revealed satisfactory total right hip replacement and good alignment.  I  do not see a preadmission EKG on the chart.  The patient's hemoglobin and  hematocrit was followed throughout the hospitalization.  Slight decrease in  hemoglobin to 12.8 before stabilizing at 13.2.   HOSPITAL COURSE:  The patient was admitted to [facility] to  undergo a right total hip arthroplasty.  The patient underwent the above-  stated procedure without complication.  The patient tolerated the procedure  well and was brought to the recovery room and then to the orthopedic floor  to continue postoperative care.  The patient was placed on PC analgesia for  pain control.  The patient was weaned to oral analgesics, and the PCA was  discontinued on postoperative  day #3.  The patient had a slight drop in  hemoglobin, but did not require blood transfusions.  The hemoglobin  stabilized at 12.8, and then on the discharge date was up to 13.2.  The  patient progressed well, and physical therapy was consulted for gait  training.  He was ambulating greater than 200 feet by postoperative day #5.  The patient was discharged home on postoperative day #5.   DISPOSITION:  Discharged 01/12/03.   DISCHARGE MEDICATIONS:  1. Coumadin 6 mg one tablet daily.  2. Percocet 10/650 one tablet q.4-6h. p.r.n. pain.  3. Robaxin 500 mg one q.4-6h. p.r.n. muscle spasm.  4. Altace 10 mg daily.   DIET:  As tolerated.   ACTIVITY:  Hip precautions.  [facility] for home care.  Touchdown weightbearing  to right lower extremity.   FOLLOW UP:  The patient will follow up with Dr. Darrelyn Hillock in the office two  weeks from the date of surgery.   CONDITION ON DISCHARGE:  Improved.     Ebbie Ridge. Paitsel, P.A.                     Ronald A. Darrelyn Hillock, M.D.    Tilden Dome  D:   03/07/2002  T:  03/07/2002  Job:  [id number]

## 2010-11-02 LAB — URINALYSIS, ROUTINE W REFLEX MICROSCOPIC
Ketones, ur: NEGATIVE
Nitrite: NEGATIVE
Urobilinogen, UA: 1

## 2010-11-02 LAB — COMPREHENSIVE METABOLIC PANEL
ALT: 29
Albumin: 4
BUN: 16
CO2: 25
Calcium: 9.3
Potassium: 4.1
Sodium: 138
Total Bilirubin: 0.8

## 2010-11-02 LAB — DIFFERENTIAL
Basophils Absolute: 0
Eosinophils Relative: 1
Lymphocytes Relative: 16
Lymphs Abs: 1.4
Monocytes Absolute: 0.6

## 2010-11-02 LAB — HEMOGLOBIN AND HEMATOCRIT, BLOOD
HCT: 28.3 — ABNORMAL LOW
HCT: 39.7
Hemoglobin: 14

## 2010-11-02 LAB — TYPE AND SCREEN: Antibody Screen: NEGATIVE

## 2010-11-02 LAB — PROTIME-INR
INR: 1.7 — ABNORMAL HIGH
Prothrombin Time: 13.2
Prothrombin Time: 20 — ABNORMAL HIGH
Prothrombin Time: 22.9 — ABNORMAL HIGH

## 2010-11-02 LAB — CBC
HCT: 43.1
MCHC: 35.1
MCV: 92.3
Platelets: 274
RBC: 4.67
RDW: 12.7
WBC: 9.3

## 2010-11-02 LAB — URINE MICROSCOPIC-ADD ON

## 2011-01-04 ENCOUNTER — Other Ambulatory Visit: Payer: Self-pay | Admitting: Internal Medicine

## 2011-01-04 NOTE — Telephone Encounter (Signed)
Lipid/Hep 272.4/995.20  

## 2011-01-15 ENCOUNTER — Other Ambulatory Visit: Payer: Self-pay | Admitting: Internal Medicine

## 2011-03-30 ENCOUNTER — Other Ambulatory Visit: Payer: Self-pay | Admitting: Internal Medicine

## 2011-03-31 NOTE — Telephone Encounter (Signed)
Prescription sent to pharmacy, no refills.  Past due for appointment and labs.

## 2011-05-04 ENCOUNTER — Other Ambulatory Visit: Payer: Self-pay | Admitting: Internal Medicine

## 2011-05-07 ENCOUNTER — Ambulatory Visit (INDEPENDENT_AMBULATORY_CARE_PROVIDER_SITE_OTHER): Payer: 59 | Admitting: Internal Medicine

## 2011-05-07 ENCOUNTER — Encounter: Payer: Self-pay | Admitting: Internal Medicine

## 2011-05-07 VITALS — BP 110/72 | HR 84 | Wt 214.0 lb

## 2011-05-07 DIAGNOSIS — Z0000 Encounter for general adult medical examination without abnormal findings: Secondary | ICD-10-CM

## 2011-05-07 DIAGNOSIS — R17 Unspecified jaundice: Secondary | ICD-10-CM

## 2011-05-07 DIAGNOSIS — E782 Mixed hyperlipidemia: Secondary | ICD-10-CM

## 2011-05-07 DIAGNOSIS — R972 Elevated prostate specific antigen [PSA]: Secondary | ICD-10-CM

## 2011-05-07 DIAGNOSIS — I10 Essential (primary) hypertension: Secondary | ICD-10-CM

## 2011-05-07 MED ORDER — LISINOPRIL 20 MG PO TABS: 20.0000 mg | ORAL_TABLET | Freq: Every day | ORAL | Status: DC

## 2011-05-07 NOTE — Patient Instructions (Addendum)
Preventive Health Care: Eat a low-fat diet with lots of fruits and vegetables, up to 7-9 servings per day. Consume less than 40 grams of sugar per day from foods & drinks with High Fructose Corn Sugar as # 1,2,3 or # 4 on label. Eye Doctor - have an eye exam @ least annually.                                                         Alcohol If you drink, do it moderately,less than 9 drinks per week, preferably less than 6 @ most. Please  schedule fasting Labs : BMET,Lipids, hepatic panel, CBC & dif, A1c, TSH, PSA. PLEASE BRING THESE INSTRUCTIONS TO FOLLOW UP  LAB APPOINTMENT.This will guarantee correct labs are drawn, eliminating need for repeat blood sampling ( needle sticks ! ). Diagnoses /Codes: V70.0  Please try to go on My Chart within  24 hours of having labs drawn  to allow me to release the results directly to you.

## 2011-05-07 NOTE — Progress Notes (Signed)
Subjective:    Patient ID: Mike Ray, male    DOB: 11/30/1950, 61 y.o.   MRN: [id number]  HPI  Mike Ray is here for a physical;he denies acute issues.     Review of Systems HYPERTENSION: Disease Monitoring: Blood pressure range-95/65- 128/70  Chest pain, palpitations- no       Dyspnea- no Medications: Compliance- in the summer he'll drop his lisinopril to half a pill every other day because of blood pressure drop when he works in the heat. Past medical history of heat stroke in 2009 Lightheadedness,Syncope- occasionally with waist flexion in heat    Edema- no  FASTING HYPERGLYCEMIA: Polyuria/phagia/dipsia- no       Visual problems- no  HYPERLIPIDEMIA: Disease Monitoring: See symptoms for Hypertension Medications: Compliance- Crestor 10 mg once weekly  Abd pain, bowel changes- no   Muscle aches- job related(landscaping)    He has not followed up with his urologist despite a history of elevated PSA. His brother had prostate cancer. He denies hesitancy, incontinence,dysuria, hematuria, or pyuria     Objective:   Physical Exam Gen.: Healthy and well-nourished in appearance. Alert, appropriate and cooperative throughout exam. Head: Normocephalic without obvious abnormalities; pattern alopecia  Eyes: No corneal or conjunctival inflammation noted. Pupils equal round reactive to light and accommodation. Fundal exam is benign without hemorrhages, exudate, papilledema. Extraocular motion intact. Vision grossly normal. Ears: External  ear exam reveals no significant lesions or deformities. Canals clear .TMs normal. Hearing is grossly normal bilaterally. Nose: External nasal exam reveals no deformity or inflammation. Nasal mucosa are pink and moist. No lesions or exudates noted. Septum slightly dislocated to L  Mouth: Oral mucosa and oropharynx reveal no lesions or exudates. Teeth in good repair. Neck: No deformities, masses, or tenderness noted. Range of motion & Thyroid  normal. Lungs: Normal respiratory effort; chest expands symmetrically. Lungs are clear to auscultation without rales, wheezes, or increased work of breathing. Heart: Normal rate and rhythm. Normal S1 and S2. No gallop, click, or rub. No murmur. Abdomen: Bowel sounds normal; abdomen soft and nontender. No masses, organomegaly or hernias noted. Genitalia/DRE: Genital  exam was unremarkable.Prostate is normal without enlargement, asymmetry, nodularity, or induration.  Musculoskeletal/extremities: No deformity or scoliosis noted of  the thoracic or lumbar spine. No clubbing, cyanosis, edema noted. Range of motion  normal .Tone & strength  normal.Joints :OA hand changes. Nail health  good. Vascular: Carotid, radial artery, dorsalis pedis and  posterior tibial pulses are full and equal. No bruits present. Neurologic: Alert and oriented x3. Deep tendon reflexes symmetrical and normal.          Skin: Intact without suspicious lesions or rashes. Lymph: No cervical, axillary, or inguinal lymphadenopathy present. Psych: Mood and affect are normal. Normally interactive                                                                                          Assessment & Plan:  #1 comprehensive physical exam; no acute findings #2 see Problem List with Assessments & Recommendations #3 past medical history of elevated PSA and family history prostate cancer. His prostate is normal. Guidelines concerning PSA measurements discussed  Plan: see Orders

## 2011-05-11 ENCOUNTER — Other Ambulatory Visit (INDEPENDENT_AMBULATORY_CARE_PROVIDER_SITE_OTHER): Payer: 59

## 2011-05-11 DIAGNOSIS — Z0000 Encounter for general adult medical examination without abnormal findings: Secondary | ICD-10-CM

## 2011-05-11 LAB — CBC WITH DIFFERENTIAL/PLATELET
Basophils Relative: 0.7 % (ref 0.0–3.0)
Eosinophils Absolute: 0.1 10*3/uL (ref 0.0–0.7)
Eosinophils Relative: 1.4 % (ref 0.0–5.0)
Hemoglobin: 14.7 g/dL (ref 13.0–17.0)
Lymphocytes Relative: 40.6 % (ref 12.0–46.0)
MCHC: 34.2 g/dL (ref 30.0–36.0)
MCV: 95.7 fl (ref 78.0–100.0)
Neutro Abs: 2 10*3/uL (ref 1.4–7.7)
RBC: 4.51 Mil/uL (ref 4.22–5.81)
WBC: 4 10*3/uL — ABNORMAL LOW (ref 4.5–10.5)

## 2011-05-11 LAB — BASIC METABOLIC PANEL
BUN: 19 mg/dL (ref 6–23)
Creatinine, Ser: 0.7 mg/dL (ref 0.4–1.5)
GFR: 128.18 mL/min (ref >60.00)

## 2011-05-11 LAB — LIPID PANEL
Cholesterol: 153 mg/dL (ref 0–200)
LDL Cholesterol: 87 mg/dL (ref 0–99)

## 2011-05-11 LAB — PSA: PSA: 2.87 ng/mL (ref 0.10–4.00)

## 2011-09-08 ENCOUNTER — Telehealth: Payer: Self-pay | Admitting: *Deleted

## 2011-09-08 DIAGNOSIS — T887XXA Unspecified adverse effect of drug or medicament, initial encounter: Secondary | ICD-10-CM

## 2011-09-08 DIAGNOSIS — E785 Hyperlipidemia, unspecified: Secondary | ICD-10-CM

## 2011-09-08 MED ORDER — PRAVASTATIN SODIUM 20 MG PO TABS: 20.0000 mg | ORAL_TABLET | Freq: Every day | ORAL | Status: DC

## 2011-09-08 NOTE — Telephone Encounter (Signed)
Pt return call,Left message to call office.  

## 2011-09-08 NOTE — Telephone Encounter (Signed)
Pt states that pravastatin is free with his insurance and would like to know if it would be ok to change from crestor.Please advise

## 2011-09-08 NOTE — Telephone Encounter (Signed)
Left message to call office

## 2011-09-08 NOTE — Telephone Encounter (Signed)
Spoke with patient, schedule appointment to recheck labs in 10 weeks. RX for pravastatin 20 mg to be sent to [facility] in [place] [address])

## 2011-09-08 NOTE — Telephone Encounter (Signed)
Crestor 10 mg with be equivalent to 40 mg of pravastatin. We can try the lower dose pravastatin for 10 weeks and reassess labs. Please  schedule fasting Labs after 10 weeks of Pravastatin 20 mg @ qhs  ( #90) : Lipids, hepatic panel, CK. Codes : 272.4.995.20.

## 2011-11-03 ENCOUNTER — Telehealth: Payer: Self-pay

## 2011-11-03 NOTE — Telephone Encounter (Signed)
Pt called left message on triage line stating he has labs scheduled for this month but doesn't know when.Pt advised call cell phone. I called pt back left message 11/17/11 8:00am lab appt.        MW

## 2011-11-17 ENCOUNTER — Other Ambulatory Visit (INDEPENDENT_AMBULATORY_CARE_PROVIDER_SITE_OTHER): Payer: 59

## 2011-11-17 DIAGNOSIS — E785 Hyperlipidemia, unspecified: Secondary | ICD-10-CM

## 2011-11-17 DIAGNOSIS — T887XXA Unspecified adverse effect of drug or medicament, initial encounter: Secondary | ICD-10-CM

## 2011-11-17 LAB — HEPATIC FUNCTION PANEL
ALT: 32 U/L (ref 0–53)
AST: 25 U/L (ref 0–37)
Albumin: 4.1 g/dL (ref 3.5–5.2)
Total Bilirubin: 1.2 mg/dL (ref 0.3–1.2)
Total Protein: 7.3 g/dL (ref 6.0–8.3)

## 2011-11-17 LAB — LIPID PANEL
HDL: 32 mg/dL — ABNORMAL LOW (ref >39.00)
Triglycerides: 198 mg/dL — ABNORMAL HIGH (ref 0.0–149.0)
VLDL: 39.6 mg/dL (ref 0.0–40.0)

## 2011-11-17 LAB — CK: Total CK: 128 U/L (ref 7–232)

## 2011-12-14 ENCOUNTER — Telehealth: Payer: Self-pay

## 2011-12-14 DIAGNOSIS — T887XXA Unspecified adverse effect of drug or medicament, initial encounter: Secondary | ICD-10-CM

## 2011-12-14 DIAGNOSIS — E785 Hyperlipidemia, unspecified: Secondary | ICD-10-CM

## 2011-12-14 DIAGNOSIS — I10 Essential (primary) hypertension: Secondary | ICD-10-CM

## 2011-12-14 NOTE — Telephone Encounter (Signed)
Spoke with patient, patient scheduled appointment for labs and follow-up with Dr.Hopper in April 2014

## 2011-12-14 NOTE — Telephone Encounter (Signed)
Message copied by Maurice Small on Tue Dec 14, 2011 11:03 AM ------      Message from: Pecola Lawless      Created: Mon Dec 13, 2011  5:58 PM       See letter 12/07/11 ; muscle pain on pravastatin 20 mg at bedtime            Please  schedule fasting Labs : BMET,Lipids, hepatic panel, CK, TSH after going back to Crestor 10 mEq once weekly for 4 months . He can pick up 3 sample packs of Crestor 10 which would last 21 weeks . Office visit 2 to 3 days after labs.

## 2012-01-10 ENCOUNTER — Encounter: Payer: Self-pay | Admitting: Internal Medicine

## 2012-01-10 ENCOUNTER — Ambulatory Visit (INDEPENDENT_AMBULATORY_CARE_PROVIDER_SITE_OTHER): Payer: 59 | Admitting: Internal Medicine

## 2012-01-10 VITALS — BP 120/78 | HR 100 | Wt 225.8 lb

## 2012-01-10 DIAGNOSIS — L0390 Cellulitis, unspecified: Secondary | ICD-10-CM

## 2012-01-10 DIAGNOSIS — L0291 Cutaneous abscess, unspecified: Secondary | ICD-10-CM

## 2012-01-10 DIAGNOSIS — H01009 Unspecified blepharitis unspecified eye, unspecified eyelid: Secondary | ICD-10-CM

## 2012-01-10 DIAGNOSIS — H01003 Unspecified blepharitis right eye, unspecified eyelid: Secondary | ICD-10-CM

## 2012-01-10 MED ORDER — AMOXICILLIN-POT CLAVULANATE 500-125 MG PO TABS: ORAL_TABLET | ORAL | Status: DC

## 2012-01-10 MED ORDER — ERYTHROMYCIN 5 MG/GM OP OINT: TOPICAL_OINTMENT | Freq: Four times a day (QID) | OPHTHALMIC | Status: DC

## 2012-01-10 NOTE — Progress Notes (Signed)
  Subjective:    Patient ID: Mike Ray, male    DOB: Mar 09, 1950, 61 y.o.   MRN: [id number]  HPI  10 days ago he was blowing out the bed of his truck when he felt something strike his eye. Since that time he's had persistent redness and an intermittent "bump" of the right upper lid. He denies any associated pain. He's had minimal discharge from the eye. He has had no blurred vision, double vision, or loss of vision.  He has used Visine and has been swimming with his eyes open but there's been no change in the symptoms.   Review of Systems He's had no associated fever, chills, or sweats     Objective:   Physical Exam  He appears healthy and well-nourished in no distress  There is a small raised area at the eyelash line lateral to the midline of the upper lid on the right. There is linear erythema which is nontender to palpation @ the base of the eyelashes.  No conjunctivitis is present  Fundal exam is unremarkable.  Extraocular motion is intact.  Field of vision is normal  Vision is intact to wall chart  He has no lymphadenopathy about the neck or axilla       Assessment & Plan:  #1 blepharitis and mild eyelid cellulitis without associated conjunctivitis  Plan: See orders and recommendations

## 2012-01-10 NOTE — Patient Instructions (Addendum)
Use the ophthalmologic ointment as directed. Strict hand washing before and after its application. 

## 2012-01-28 ENCOUNTER — Ambulatory Visit (INDEPENDENT_AMBULATORY_CARE_PROVIDER_SITE_OTHER): Payer: 59 | Admitting: Family Medicine

## 2012-01-28 ENCOUNTER — Encounter: Payer: Self-pay | Admitting: Family Medicine

## 2012-01-28 VITALS — BP 120/70 | HR 77 | Temp 97.6°F | Ht 70.5 in | Wt 231.2 lb

## 2012-01-28 DIAGNOSIS — H00019 Hordeolum externum unspecified eye, unspecified eyelid: Secondary | ICD-10-CM

## 2012-01-28 NOTE — Progress Notes (Signed)
  Subjective:    Patient ID: Mike Ray, male    DOB: 10-12-1950, 62 y.o.   MRN: [id number]  HPI R eye problem- pt was seen on 12/16 for pain and infxn after getting hit in eye w/ debris from leaf blower.  Used erythromycin ointment and augmentin and pain and swelling improved.  Now has small residual lump on upper lid- not enlarging, not painful.  No drainage from eye, no visual changes.  Does not have an eye doctor.   Review of Systems For ROS see HPI     Objective:   Physical Exam  Vitals reviewed. Constitutional: He appears well-developed and well-nourished. No distress.  HENT:  Head: Normocephalic and atraumatic.  Eyes: Conjunctivae normal and EOM are normal. Pupils are equal, round, and reactive to light. Right eye exhibits no discharge. Left eye exhibits no discharge.       R upper lid w/ 0.5 cm induration/visible mass at lash line.  No TTP, no overlying erythema- consistent w/ stye          Assessment & Plan:

## 2012-01-28 NOTE — Patient Instructions (Addendum)
This appears to be a stye Apply hot compresses 2-3x/day We'll call you with your eye appt Call with any questions or concerns Hang in there! Happy New Year!

## 2012-01-30 NOTE — Assessment & Plan Note (Signed)
New.  No evidence of infxn.  Pt does not have eye doctor so referral was made for complete evaluation as pt reports stye has been present for multiple months.  Encouraged hot compresses.  Reviewed supportive care and red flags that should prompt return.  Pt expressed understanding and is in agreement w/ plan.

## 2012-02-18 ENCOUNTER — Encounter: Payer: Self-pay | Admitting: Internal Medicine

## 2012-02-18 ENCOUNTER — Ambulatory Visit (INDEPENDENT_AMBULATORY_CARE_PROVIDER_SITE_OTHER): Payer: 59 | Admitting: Internal Medicine

## 2012-02-18 VITALS — BP 120/82 | HR 105 | Wt 231.0 lb

## 2012-02-18 DIAGNOSIS — H01009 Unspecified blepharitis unspecified eye, unspecified eyelid: Secondary | ICD-10-CM

## 2012-02-18 DIAGNOSIS — L0390 Cellulitis, unspecified: Secondary | ICD-10-CM

## 2012-02-18 DIAGNOSIS — L0291 Cutaneous abscess, unspecified: Secondary | ICD-10-CM

## 2012-02-18 DIAGNOSIS — H01003 Unspecified blepharitis right eye, unspecified eyelid: Secondary | ICD-10-CM

## 2012-02-18 MED ORDER — ERYTHROMYCIN 5 MG/GM OP OINT: TOPICAL_OINTMENT | Freq: Four times a day (QID) | OPHTHALMIC | Status: DC

## 2012-02-18 MED ORDER — CEPHALEXIN 500 MG PO CAPS: 500.0000 mg | ORAL_CAPSULE | Freq: Two times a day (BID) | ORAL | Status: DC

## 2012-02-18 NOTE — Progress Notes (Signed)
  Subjective:    Patient ID: Mike Ray, male    DOB: [birth year], 62 y.o.   MRN: [id number]  HPI The lesion of the upper lid on the right has waxed and waned in size and tenderness. Warm compresses do result in significant decrease in size. He's been using the topical erythromycin ointment irregularly.  Ophthalmology appointment had been made; but by the time he was called the lesion had improved dramatically prompting him to cancel the appointment.      Review of Systems  The lesion has never drained despite intermittent decrease in size with warm compresses. He also denies fever, chills, sweats, purulent discharge, blurred vision, diplopia, or loss of vision.   He  specifically denies frontal headache, facial pain, dental pain, sore throat, or nasal purulence.    Objective:   Physical Exam General appearance:good health ;well nourished; no acute distress or increased work of breathing is present.  No  lymphadenopathy about the head, neck, or axilla noted.   Eyes: A 3 x 4 mm sty is present slightly  lateral to the midline of the right upper lid. This is erythematous without a pustule. Extraocular motion is intact. Vision is intact.   Otoscopic examination reveals clear canals, tympanic membranes are intact bilaterally without bulging, retraction, inflammation or discharge.  Nose:  External nasal examination shows no deformity or inflammation. Nasal mucosa are pink and moist without lesions or exudates. No septal dislocation or deviation.No obstruction to airflow.   Oral exam: Dental hygiene is good; lips and gums are healthy appearing.There is no oropharyngeal erythema or exudate noted.   Neck:  No deformities,  masses, or tenderness noted.     Skin: Warm & dry w/o other lesions or rash          Assessment & Plan:   #1 recurrent sty right upper lid. This may represent an ingrown hair or foreign body. He does he hasn't multiple environmental exposures in his line of  work.  Plan: He should continue the warm compresses and the topical antibiotic at least 3 times a day on a regular basis. Oral antibodies will be initiated. Ophthalmology referral will be made. Even if  the lesion is not present at the time he is seen; the purposes is to get him into the system so he can be seen emergently if needed.

## 2012-02-18 NOTE — Patient Instructions (Addendum)
Use warm moist compresses to 3-4 times a day to the affected area.  Review and correct the record as indicated. Please share record with all medical staff seen.

## 2012-02-21 ENCOUNTER — Other Ambulatory Visit: Payer: Self-pay | Admitting: *Deleted

## 2012-02-21 DIAGNOSIS — L0390 Cellulitis, unspecified: Secondary | ICD-10-CM

## 2012-02-21 DIAGNOSIS — H01003 Unspecified blepharitis right eye, unspecified eyelid: Secondary | ICD-10-CM

## 2012-02-21 MED ORDER — ERYTHROMYCIN 5 MG/GM OP OINT: TOPICAL_OINTMENT | Freq: Four times a day (QID) | OPHTHALMIC | Status: DC

## 2012-02-21 NOTE — Telephone Encounter (Signed)
Refill for erythromycin ointment sent to [facility].

## 2012-02-22 ENCOUNTER — Telehealth: Payer: Self-pay | Admitting: Internal Medicine

## 2012-02-22 NOTE — Telephone Encounter (Signed)
Spoke with patient, patient states [facility] to [facility] yesterday and rx for eye ointment was not there. Patient would like rx sent in today, I checked the system and rx was sent yesterday. Patient informed I will contact the pharmacy to verify rx received.  I called the pharmacist and rx was received via e-scribe.   I called patient back with a status update

## 2012-02-22 NOTE — Telephone Encounter (Signed)
Pt left msg on triage line stating [facility] does not have the eye ointment and he would like to know how he can get this filled at another pharmacy. CB# [phone]

## 2012-04-25 ENCOUNTER — Encounter: Payer: Self-pay | Admitting: Lab

## 2012-04-26 ENCOUNTER — Other Ambulatory Visit (INDEPENDENT_AMBULATORY_CARE_PROVIDER_SITE_OTHER): Payer: 59

## 2012-04-26 DIAGNOSIS — T887XXA Unspecified adverse effect of drug or medicament, initial encounter: Secondary | ICD-10-CM

## 2012-04-26 DIAGNOSIS — E785 Hyperlipidemia, unspecified: Secondary | ICD-10-CM

## 2012-04-26 DIAGNOSIS — I10 Essential (primary) hypertension: Secondary | ICD-10-CM

## 2012-04-26 LAB — BASIC METABOLIC PANEL
CO2: 27 mEq/L (ref 19–32)
Chloride: 105 mEq/L (ref 96–112)
Glucose, Bld: 99 mg/dL (ref 70–99)
Potassium: 3.8 mEq/L (ref 3.5–5.1)
Sodium: 140 mEq/L (ref 135–145)

## 2012-04-26 LAB — LIPID PANEL
HDL: 32.3 mg/dL — ABNORMAL LOW (ref >39.00)
Total CHOL/HDL Ratio: 5
VLDL: 27.8 mg/dL (ref 0.0–40.0)

## 2012-04-26 LAB — CK: Total CK: 153 U/L (ref 7–232)

## 2012-04-26 LAB — HEPATIC FUNCTION PANEL
Albumin: 4.5 g/dL (ref 3.5–5.2)
Total Bilirubin: 1.2 mg/dL (ref 0.3–1.2)

## 2012-04-26 LAB — TSH: TSH: 0.8 u[IU]/mL (ref 0.35–5.50)

## 2012-05-01 ENCOUNTER — Encounter: Payer: Self-pay | Admitting: Internal Medicine

## 2012-05-01 ENCOUNTER — Ambulatory Visit (INDEPENDENT_AMBULATORY_CARE_PROVIDER_SITE_OTHER): Payer: 59 | Admitting: Internal Medicine

## 2012-05-01 VITALS — BP 118/82 | HR 80 | Wt 225.0 lb

## 2012-05-01 DIAGNOSIS — Z2911 Encounter for prophylactic immunotherapy for respiratory syncytial virus (RSV): Secondary | ICD-10-CM

## 2012-05-01 DIAGNOSIS — I10 Essential (primary) hypertension: Secondary | ICD-10-CM

## 2012-05-01 DIAGNOSIS — Z87442 Personal history of urinary calculi: Secondary | ICD-10-CM

## 2012-05-01 DIAGNOSIS — R972 Elevated prostate specific antigen [PSA]: Secondary | ICD-10-CM

## 2012-05-01 DIAGNOSIS — E782 Mixed hyperlipidemia: Secondary | ICD-10-CM

## 2012-05-01 DIAGNOSIS — Z23 Encounter for immunization: Secondary | ICD-10-CM

## 2012-05-01 MED ORDER — ROSUVASTATIN CALCIUM 10 MG PO TABS: ORAL_TABLET | ORAL | Status: DC

## 2012-05-01 MED ORDER — LISINOPRIL 20 MG PO TABS: 20.0000 mg | ORAL_TABLET | Freq: Every day | ORAL | Status: DC

## 2012-05-01 NOTE — Assessment & Plan Note (Signed)
Prostate is normal but PSA monitor recommended in view of history of prostate cancer in the family and prior elevated PSA

## 2012-05-01 NOTE — Progress Notes (Signed)
  Subjective:    Patient ID: Mike Ray, male    DOB: [birth year], 62 y.o.   MRN: [id number]  HPI  He is here to followup on his dyslipidemia and hypertension. Labs were reviewed. All were excellent except for a mildly reduced HDL of 32.3. There's been a dramatic improvement in his triglycerides. In the past these have been as high as 272; at present they're goal of less than 150 the value of 139.  HYPERTENSION: Disease Monitoring: Blood pressure range 130s/mid-high 80s  Compliant with present antihypertensive regimen   HYPERLIPIDEMIA: No specific but restricting HFCS.Job very physical Medication Compliance:  Statin taken ONCE weekly Positive FH premature CAD   Past medical history/family history/social history were all reviewed and updated.     Review of Systems  No significant headaches No chest pain,  claudication, PNDyspnea . Some stress related racing   No exertional dyspnea No lightheadedness or syncope   No edema No polyuria/phagia/dipsia/ hematuria/pyuria/dysuria No limb numbness & tingling or weakness No non healing skin lesions      No blurred , double or loss of vision No abd pain, bowel changes No significant myalgias            Objective:   Physical Exam Gen.: Healthy and well-nourished in appearance. Alert, appropriate and cooperative throughout exam. Head: Normocephalic without obvious abnormalities;  Beard & moustache  Eyes: No corneal or conjunctival inflammation noted.  Neck: No deformities, masses, or tenderness noted.  Thyroid normal. Lungs: Normal respiratory effort; chest expands symmetrically. Lungs are clear to auscultation without rales, wheezes, or increased work of breathing. Heart: Normal rate and rhythm. Normal S1 and S2. No gallop, click, or rub. No murmur. Abdomen: Bowel sounds normal; abdomen soft and nontender. No masses, organomegaly or hernias noted.                                  Musculoskeletal/extremities:  No clubbing,  cyanosis,or  edema noted. Tone & strength  normal.Joints  reveal  DJD DIP changes. Nail health good. Able to lie down & sit up w/o help. Vascular: Carotid, radial artery, dorsalis pedis and  posterior tibial pulses are full and equal. No bruits present. GU exam normal.Prostate is normal without enlargement, asymmetry, nodularity, or induration. Neurologic: Alert and oriented x3.       Skin: Intact without suspicious lesions or rashes. Lymph: No cervical, axillary, or inguinal lymphadenopathy present. Psych: Mood and affect are normal. Normally interactive                                                                                           Assessment & Plan:

## 2012-05-01 NOTE — Assessment & Plan Note (Signed)
Blood pressure is excellent; no change in medication recommended. In the summer he does get dehydrated as he works outside. He had been taking the lisinopril every other day. I recommend he decrease the dose to half a pill daily. There is a past  history heat stroke. Supplementing with Gatorade light is recommended during those periods of increased heat exposure

## 2012-05-01 NOTE — Assessment & Plan Note (Signed)
Fish oil is not recommended as a source of omega-3 fatty acids because of possible impact on prostate. Increasing the Crestor to 10 mg twice a week is recommended because of his strong family history coronary disease. LDL goal is less than 70 if possible

## 2012-05-01 NOTE — Patient Instructions (Addendum)
Minimal Blood Pressure Goal= AVERAGE < 140/90;  Ideal is an AVERAGE < 135/85. This AVERAGE should be calculated from @ least 5-7 BP readings taken @ different times of day on different days of week. You should not respond to isolated BP readings , but rather the AVERAGE for that week .Please bring your  blood pressure cuff to office visits to verify that it is reliable.It  can also be checked against the blood pressure device at the pharmacy. Finger or wrist cuffs are not dependable; an arm cuff is.  If you activate the  My Chart system; lab & Xray results will be released directly  to you as soon as I review & address these through the computer. If you choose not to sign up for My Chart within 36 hours of labs being drawn; results will be reviewed & interpretation added before being copied & mailed, causing a delay in getting the results to you.If you do not receive that report within 7-10 days ,please call. Additionally you can use this system to gain direct  access to your records  if  out of town or @ an office of a  physician who is not in  the My Chart network.  This improves continuity of care & places you in control of your medical record.  

## 2012-05-29 ENCOUNTER — Ambulatory Visit (INDEPENDENT_AMBULATORY_CARE_PROVIDER_SITE_OTHER): Payer: 59 | Admitting: *Deleted

## 2012-05-29 DIAGNOSIS — Z23 Encounter for immunization: Secondary | ICD-10-CM

## 2012-11-30 ENCOUNTER — Other Ambulatory Visit: Payer: Self-pay

## 2013-05-09 ENCOUNTER — Telehealth: Payer: Self-pay | Admitting: Internal Medicine

## 2013-05-09 ENCOUNTER — Other Ambulatory Visit: Payer: Self-pay | Admitting: Internal Medicine

## 2013-05-09 DIAGNOSIS — I10 Essential (primary) hypertension: Secondary | ICD-10-CM

## 2013-05-09 DIAGNOSIS — E782 Mixed hyperlipidemia: Secondary | ICD-10-CM

## 2013-05-09 MED ORDER — ROSUVASTATIN CALCIUM 10 MG PO TABS: ORAL_TABLET | ORAL | Status: DC

## 2013-05-09 NOTE — Telephone Encounter (Signed)
lmom to call

## 2013-05-09 NOTE — Telephone Encounter (Signed)
Pt is aware pf rx [facility] to [facility]. Pt stated that he will call back to schedule an appt later.

## 2013-05-09 NOTE — Telephone Encounter (Signed)
Patient states that he was given Crestor 10 mg samples.  He now needs a RX.  Does he need to come in for an appointment or can he simply get a refill on the Crestor?  [phone]

## 2013-05-09 NOTE — Telephone Encounter (Signed)
Please schedule followup appointment in 4-6 weeks.Labs entered to be done 2-3 days pre appt.Rx sent to [facility]. Please bring all actual pill bottles and supplements.

## 2013-07-17 ENCOUNTER — Ambulatory Visit (INDEPENDENT_AMBULATORY_CARE_PROVIDER_SITE_OTHER): Payer: 59 | Admitting: Internal Medicine

## 2013-07-17 ENCOUNTER — Encounter: Payer: Self-pay | Admitting: Internal Medicine

## 2013-07-17 VITALS — BP 120/86 | HR 74 | Temp 98.1°F | Wt 213.0 lb

## 2013-07-17 DIAGNOSIS — I10 Essential (primary) hypertension: Secondary | ICD-10-CM

## 2013-07-17 DIAGNOSIS — E785 Hyperlipidemia, unspecified: Secondary | ICD-10-CM

## 2013-07-17 DIAGNOSIS — L259 Unspecified contact dermatitis, unspecified cause: Secondary | ICD-10-CM

## 2013-07-17 MED ORDER — LISINOPRIL 20 MG PO TABS: ORAL_TABLET | ORAL | Status: DC

## 2013-07-17 NOTE — Progress Notes (Signed)
Pre visit review using our clinic review tool, if applicable. No additional management support is needed unless otherwise documented below in the visit note. 

## 2013-07-17 NOTE — Patient Instructions (Signed)

## 2013-07-17 NOTE — Progress Notes (Signed)
   Subjective:    Patient ID: Mike Ray, male    DOB: 07/20/1950, 63 y.o.   MRN: [id number]  HPI A heart healthy diet is followed; exercise encompasses landscaping and swimming 5 and 1 time(s) per week for variable times without symptoms.  Family history is positive for premature coronary disease. (Father and Brother's see below). Advanced cholesterol testing reveals 100 LDL on 04/26/12 goal is less than 130 ; ideally < 100 . There is medication compliance with the statin.  Low dose ASA taken  Blood pressure range / average : 120/80's - 140's/90's; avg 132/86 Compliant with anti hypertemsive medication: Good No lightheadedness or other adverse medication effect described.  A heart healthy /low salt diet is followed most days. Family history is Father- 55 MI dec. Bro- MI 59 Bro-Stents for HTN / No CVA  Bug bites on lower right lateral leg. There is a 1 mm pustule on erythematous base (one proximally and one distally). It is described as pruritic. He has tried triple antibiotic ointment and benadryl spray with relief. He has some excoriation at the site.    Review of Systems Muscle cramps in legs/hips from heat and working outside. Seen by orthopedics.  Significant abdominal symptoms, memory deficit, or myalgias not present.   Significant headaches, epistaxis, chest pain, palpitations, exertional dyspnea, claudication, paroxysmal nocturnal dyspnea, or edema absent.  Specifically denied are chest pain, palpitations, dyspnea, or claudication.     Objective:   Physical Exam      Assessment & Plan:

## 2013-07-17 NOTE — Progress Notes (Signed)
   Subjective:    Patient ID: Mike Ray, male    DOB: [birth year], 63 y.o.   MRN: [id number]  HPI  He is here for followup of his hypertension and dyslipidemia. He has not had labs since April 2014.  He is on a heart healthy/low salt diet and exercises 5-6 times a week. His job is physical as a Development worker, international aid. He also swims once a week. He has no cardio pulmonary symptoms with the high level of exercise  He does have a family history of myocardial infarction in his father @ 1 and his brother at 45. His brothers had stents. There is no stroke in the family.  He is compliant with his blood pressure medicines. BP ranges from 120 / 80s-140/90s. The average is 132/86.  He has no lightheadedness or other adverse effects with the medication. He is on ACE inhibitor 20 mg alternating with 10 mg daily. This change was made because of profoundly low blood pressures during the summer with heat exhaustion-type symptoms.  He is also compliant with a low dose statin. Specifically he takes 10 mg Crestor once a week. He thought he was bitten by a bug on the right lower extremity. There was no definite vector identified. A vesicular rash was noted over an erythematous base. He is also exposed to poison ivy. He has used up a topical antibiotic  ointment and Benadryl spray with relief.   Review of Systems  He is having muscle cramps in his lower extremities in the hip area from the heat. He has been evaluated by an Orthopediist  He denies any significant abdominal symptoms of constipation, diarrhea, melena, rectal bleeding  No memory deficits ; no definite myalgias.  He also denies significant headaches, epistaxis, chest pain, palpitations, exertional dyspnea, claudication, paroxysmal nocturnal dyspnea, or edema.          Objective:   Physical Exam Appears healthy and well-nourished & in no acute distress. Pattern alopecia.  No carotid bruits are present.No neck pain distention present at 10 -  15 degrees. Thyroid normal to palpation  Heart rhythm and rate are normal with no gallop or murmur  Chest is clear with no increased work of breathing  There is no evidence of aortic aneurysm or renal artery bruits  Abdomen soft with no organomegaly or masses. No HJR  No clubbing, cyanosis or edema present.  Pedal pulses are intact   No ischemic skin changes are present . There is an area of what appears to be contact dermatitis of the right lateral ankle area. There's a linear excoriation with an erythematous base. There is no evidence of cellulitis or abscess. Fingernails healthy   Alert and oriented. Strength, tone, DTRs reflexes normal          Assessment & Plan:  #1 hypertension, adequate control. He'll be asked to monitor his blood pressure on lisinopril 20 mg one half pill daily to see if this will give adequate control without adverse effect  #2 dyslipidemia; labs need to be updated. He was given samples of Crestor pending those being completed  #3 contact dermatitis suggested over the right lower extremity. He should discontinue the topical antibiotic.

## 2013-07-18 ENCOUNTER — Telehealth: Payer: Self-pay | Admitting: Internal Medicine

## 2013-07-18 NOTE — Telephone Encounter (Signed)
Relevant patient education mailed to patient.  

## 2013-08-02 ENCOUNTER — Other Ambulatory Visit (INDEPENDENT_AMBULATORY_CARE_PROVIDER_SITE_OTHER): Payer: 59

## 2013-08-02 ENCOUNTER — Other Ambulatory Visit: Payer: Self-pay | Admitting: Internal Medicine

## 2013-08-02 DIAGNOSIS — Z1389 Encounter for screening for other disorder: Secondary | ICD-10-CM

## 2013-08-02 DIAGNOSIS — I10 Essential (primary) hypertension: Secondary | ICD-10-CM

## 2013-08-02 DIAGNOSIS — Z139 Encounter for screening, unspecified: Secondary | ICD-10-CM

## 2013-08-02 DIAGNOSIS — E782 Mixed hyperlipidemia: Secondary | ICD-10-CM

## 2013-08-02 LAB — LIPID PANEL
CHOL/HDL RATIO: 5
CHOLESTEROL: 177 mg/dL (ref 0–200)
HDL: 36.2 mg/dL — ABNORMAL LOW (ref >39.00)
LDL Cholesterol: 102 mg/dL — ABNORMAL HIGH (ref 0–99)
NonHDL: 140.8
TRIGLYCERIDES: 192 mg/dL — AB (ref 0.0–149.0)
VLDL: 38.4 mg/dL (ref 0.0–40.0)

## 2013-08-02 LAB — BASIC METABOLIC PANEL
BUN: 33 mg/dL — ABNORMAL HIGH (ref 6–23)
CALCIUM: 9.9 mg/dL (ref 8.4–10.5)
CHLORIDE: 103 meq/L (ref 96–112)
CO2: 28 meq/L (ref 19–32)
CREATININE: 1 mg/dL (ref 0.4–1.5)
GFR: 76.61 mL/min (ref >60.00)
GLUCOSE: 115 mg/dL — AB (ref 70–99)
Potassium: 5 mEq/L (ref 3.5–5.1)
Sodium: 139 mEq/L (ref 135–145)

## 2013-08-02 LAB — HEPATIC FUNCTION PANEL
ALT: 31 U/L (ref 0–53)
AST: 25 U/L (ref 0–37)
Albumin: 4.8 g/dL (ref 3.5–5.2)
Alkaline Phosphatase: 54 U/L (ref 39–117)
BILIRUBIN DIRECT: 0.2 mg/dL (ref 0.0–0.3)
TOTAL PROTEIN: 7.4 g/dL (ref 6.0–8.3)
Total Bilirubin: 1.5 mg/dL — ABNORMAL HIGH (ref 0.2–1.2)

## 2013-08-02 LAB — URINALYSIS
Bilirubin Urine: NEGATIVE
Hgb urine dipstick: NEGATIVE
Ketones, ur: NEGATIVE
Leukocytes, UA: NEGATIVE
Nitrite: NEGATIVE
SPECIFIC GRAVITY, URINE: 1.02 (ref 1.000–1.030)
UROBILINOGEN UA: 0.2 (ref 0.0–1.0)
Urine Glucose: NEGATIVE
pH: 5.5 (ref 5.0–8.0)

## 2013-08-02 LAB — TSH: TSH: 1.33 u[IU]/mL (ref 0.35–4.50)

## 2013-08-03 ENCOUNTER — Telehealth: Payer: Self-pay | Admitting: Internal Medicine

## 2013-08-03 NOTE — Telephone Encounter (Signed)
Relevant patient education assigned to patient using Emmi. ° °

## 2013-08-04 LAB — URINE CULTURE
COLONY COUNT: NO GROWTH
ORGANISM ID, BACTERIA: NO GROWTH

## 2013-08-29 ENCOUNTER — Telehealth: Payer: Self-pay | Admitting: Internal Medicine

## 2013-08-29 NOTE — Telephone Encounter (Signed)
Updated chart.

## 2013-08-29 NOTE — Telephone Encounter (Signed)
Pt send a massage through mychart request for our office to please update "My Chart" as I am NOT due for a Colonoscopy. That procedure was completed Dec 16th, 2011.

## 2014-06-28 ENCOUNTER — Other Ambulatory Visit: Payer: Self-pay | Admitting: Internal Medicine

## 2014-07-02 ENCOUNTER — Encounter: Payer: Self-pay | Admitting: Internal Medicine

## 2014-07-02 ENCOUNTER — Ambulatory Visit (INDEPENDENT_AMBULATORY_CARE_PROVIDER_SITE_OTHER): Payer: 59 | Admitting: Internal Medicine

## 2014-07-02 VITALS — BP 124/78 | HR 82 | Temp 97.9°F | Resp 18 | Ht 71.0 in | Wt 222.0 lb

## 2014-07-02 DIAGNOSIS — Z0000 Encounter for general adult medical examination without abnormal findings: Secondary | ICD-10-CM | POA: Diagnosis not present

## 2014-07-02 NOTE — Progress Notes (Signed)
Pre visit review using our clinic review tool, if applicable. No additional management support is needed unless otherwise documented below in the visit note. 

## 2014-07-02 NOTE — Progress Notes (Signed)
Subjective:    Patient ID: Mike Ray, male    DOB: [birth year], 64 y.o.   MRN: [id number]  HPI He is here for a physical;acute issues include recent right hip pain following trauma. He fell on the right hip ;he has had bilateral total hip replacement. He said there was no fracture ;his Orthopedist prescribed diclofenac 75 mg twice a day.  Rarely, 2-3 times in the past year, he has had instantaneous left chest pain which is not necessarily exertional. It resolves in seconds without treatment.This actually dates back to high school.  He is restricting red meat some;he eats fried foods. He does eat salt. He is swimming 0.25 mile per week and is physically active on his  job without symptoms.  He has 15 beers a week.  He smoked 1969-2002 up to 1 pack per day.  Family history reveals myocardial infarction his father @ 73; 5 stents @ 56 in one brother; and heart attack at 78 in another brother. One brother had prostate cancer.  Review of Systems  He does have nocturia once a night but drinks half a gallon water in the evenings. Palpitations, tachycardia, exertional dyspnea, paroxysmal nocturnal dyspnea, claudication or edema are absent. No unexplained weight loss, abdominal pain, significant dyspepsia, dysphagia, melena, rectal bleeding, or persistently small caliber stools. Dysuria, pyuria, hematuria, frequency, or polyuria are denied. Change in hair, skin, nails denied. No bowel changes of constipation or diarrhea. No intolerance to heat or cold.      Objective:   Physical Exam Gen.: Adequately nourished in appearance. Alert, appropriate and cooperative throughout exam. BMI:30.98 Appears younger than stated age  Head: Normocephalic without obvious abnormalities;  pattern alopecia . Goatee Eyes: No corneal or conjunctival inflammation noted. Pupils equal round reactive to light and accommodation. Extraocular motion intact.  Ears: External  ear exam reveals no significant lesions or  deformities. Canals clear .TMs normal. Hearing is slightly decreased on L. Nose: External nasal exam reveals no deformity or inflammation. Nasal mucosa erythematous on R. No lesions or exudates noted.   Mouth: Oral mucosa and oropharynx reveal no lesions or exudates. Teeth in good repair. Neck: No deformities, masses, or tenderness noted. Range of motion & Thyroid normal Lungs: Normal respiratory effort; chest expands symmetrically. Lungs are clear to auscultation without rales, wheezes, or increased work of breathing. Heart: Normal rate and rhythm. Normal S1 and S2. No gallop, click, or rub. No murmur. Abdomen: Bowel sounds normal; abdomen soft and nontender. No masses, organomegaly or hernias noted. Genitalia:Genitalia normal except for left varices. Prostate is normal without enlargement, asymmetry, nodularity, or induration                                 Musculoskeletal/extremities: No deformity or scoliosis noted of  the thoracic or lumbar spine. No clubbing, cyanosis, edema, or significant extremity  deformity noted.  Range of motion normal . Tone & strength normal. Hand joints reveal mild  DJD DIP changes.  Fingernail  health good. Minor crepitus of knees  Able to lie down & sit up w/o help.  Negative SLR bilaterally Vascular: Carotid, radial artery, dorsalis pedis and  posterior tibial pulses are full and equal. No bruits present. Neurologic: Alert and oriented x3. Deep tendon reflexes symmetrical and normal.  Gait normal      Skin: Intact without suspicious lesions or rashes. Lymph: No cervical, axillary, or inguinal lymphadenopathy present. Psych: Mood and affect are normal. Normally  interactive                                                                                        Assessment & Plan:  #1 comprehensive physical exam; no acute findings  Plan: see Orders  & Recommendations

## 2014-07-02 NOTE — Patient Instructions (Signed)
  Your next office appointment will be determined based upon review of your pending labs. Those written interpretation of the lab results and instructions will be transmitted to you by My Chart  Critical results will be called.   Followup as needed for any active or acute issue. Please report any significant change in your symptoms. 

## 2014-07-10 ENCOUNTER — Other Ambulatory Visit (INDEPENDENT_AMBULATORY_CARE_PROVIDER_SITE_OTHER): Payer: 59

## 2014-07-10 DIAGNOSIS — R7989 Other specified abnormal findings of blood chemistry: Secondary | ICD-10-CM | POA: Diagnosis not present

## 2014-07-10 DIAGNOSIS — Z0000 Encounter for general adult medical examination without abnormal findings: Secondary | ICD-10-CM | POA: Diagnosis not present

## 2014-07-10 LAB — BASIC METABOLIC PANEL
BUN: 28 mg/dL — AB (ref 6–23)
CHLORIDE: 104 meq/L (ref 96–112)
CO2: 28 mEq/L (ref 19–32)
Calcium: 9.5 mg/dL (ref 8.4–10.5)
Creatinine, Ser: 0.98 mg/dL (ref 0.40–1.50)
GFR: 81.8 mL/min (ref >60.00)
Glucose, Bld: 104 mg/dL — ABNORMAL HIGH (ref 70–99)
POTASSIUM: 4.4 meq/L (ref 3.5–5.1)
Sodium: 139 mEq/L (ref 135–145)

## 2014-07-10 LAB — LIPID PANEL
CHOLESTEROL: 183 mg/dL (ref 0–200)
HDL: 34.4 mg/dL — ABNORMAL LOW (ref >39.00)
NONHDL: 148.6
Total CHOL/HDL Ratio: 5
Triglycerides: 201 mg/dL — ABNORMAL HIGH (ref 0.0–149.0)
VLDL: 40.2 mg/dL — AB (ref 0.0–40.0)

## 2014-07-10 LAB — CBC WITH DIFFERENTIAL/PLATELET
BASOS ABS: 0 10*3/uL (ref 0.0–0.1)
Basophils Relative: 0.5 % (ref 0.0–3.0)
Eosinophils Absolute: 0.2 10*3/uL (ref 0.0–0.7)
Eosinophils Relative: 2.3 % (ref 0.0–5.0)
HEMATOCRIT: 47.8 % (ref 39.0–52.0)
Hemoglobin: 16.3 g/dL (ref 13.0–17.0)
LYMPHS ABS: 2.1 10*3/uL (ref 0.7–4.0)
Lymphocytes Relative: 31 % (ref 12.0–46.0)
MCHC: 34.1 g/dL (ref 30.0–36.0)
MCV: 95.6 fl (ref 78.0–100.0)
MONO ABS: 0.5 10*3/uL (ref 0.1–1.0)
MONOS PCT: 7.7 % (ref 3.0–12.0)
NEUTROS ABS: 4 10*3/uL (ref 1.4–7.7)
Neutrophils Relative %: 58.5 % (ref 43.0–77.0)
Platelets: 246 10*3/uL (ref 150.0–400.0)
RBC: 5 Mil/uL (ref 4.22–5.81)
RDW: 13.4 % (ref 11.5–15.5)
WBC: 6.8 10*3/uL (ref 4.0–10.5)

## 2014-07-10 LAB — HEPATIC FUNCTION PANEL
ALBUMIN: 4.9 g/dL (ref 3.5–5.2)
ALK PHOS: 55 U/L (ref 39–117)
ALT: 31 U/L (ref 0–53)
AST: 25 U/L (ref 0–37)
Bilirubin, Direct: 0.2 mg/dL (ref 0.0–0.3)
TOTAL PROTEIN: 7.1 g/dL (ref 6.0–8.3)
Total Bilirubin: 1.3 mg/dL — ABNORMAL HIGH (ref 0.2–1.2)

## 2014-07-10 LAB — TSH: TSH: 1.04 u[IU]/mL (ref 0.35–4.50)

## 2014-07-10 LAB — PSA: PSA: 2.36 ng/mL (ref 0.10–4.00)

## 2014-07-10 LAB — LDL CHOLESTEROL, DIRECT: Direct LDL: 117 mg/dL

## 2014-07-10 LAB — HEMOGLOBIN A1C: Hgb A1c MFr Bld: 5.5 % (ref 4.6–6.5)

## 2014-07-19 ENCOUNTER — Encounter: Payer: Self-pay | Admitting: Internal Medicine

## 2014-10-05 ENCOUNTER — Other Ambulatory Visit: Payer: Self-pay | Admitting: Internal Medicine

## 2014-10-07 ENCOUNTER — Other Ambulatory Visit: Payer: Self-pay | Admitting: Emergency Medicine

## 2014-10-07 MED ORDER — LISINOPRIL 20 MG PO TABS: 20.0000 mg | ORAL_TABLET | Freq: Every day | ORAL | Status: DC

## 2015-07-07 ENCOUNTER — Telehealth: Payer: Self-pay

## 2015-07-07 ENCOUNTER — Other Ambulatory Visit: Payer: Self-pay | Admitting: Internal Medicine

## 2015-07-07 MED ORDER — LISINOPRIL 20 MG PO TABS: 20.0000 mg | ORAL_TABLET | Freq: Every day | ORAL | Status: DC

## 2015-07-07 NOTE — Telephone Encounter (Signed)
Patient is new to medicare---has made appt to get established with dr burns on 8/21 at 10:30---he would also like annual wellness (free) visit that medicare offers for new medicare folks----can he do same day as his appt with dr burns?---have both appts back to back if possible---please call him back and schedule---thanks

## 2015-07-07 NOTE — Telephone Encounter (Signed)
Error

## 2015-07-10 ENCOUNTER — Telehealth: Payer: Self-pay

## 2015-07-10 NOTE — Telephone Encounter (Signed)
fup with AWV per note From Jonelle Sidle regarding his request for AWV; If this is his first AWV, which it appears to be his first year on  Medicare, then he will have his "Welcome to Medicare" visit with the doctor. Left vm for call back to explain and confirm if this is his first year.  Sh

## 2015-07-12 DIAGNOSIS — J069 Acute upper respiratory infection, unspecified: Secondary | ICD-10-CM | POA: Diagnosis not present

## 2015-07-15 NOTE — Telephone Encounter (Signed)
Call from Mr. Limbach. Stated his Medicare started in April. Explained that his first year on Medicare will be completed by the MD as the "Welcome to medicare visit". Has apt in August to see Dr. Quay Burow. Stated that Dr. Quay Burow may complete if time or may have him back but the doctor does do the 1st visit.

## 2015-07-30 ENCOUNTER — Encounter (HOSPITAL_COMMUNITY): Payer: Self-pay | Admitting: Cardiovascular Disease

## 2015-07-30 ENCOUNTER — Encounter (HOSPITAL_COMMUNITY)
Admission: EM | Disposition: A | Discharge status: Home / Self Care | Payer: Self-pay | Source: Home / Self Care | Attending: Cardiovascular Disease

## 2015-07-30 ENCOUNTER — Inpatient Hospital Stay (HOSPITAL_COMMUNITY)
Admission: EM | Admit: 2015-07-30 | Discharge: 2015-08-01 | DRG: 247 | Disposition: A | Discharge status: Home / Self Care | Payer: Medicare Other | Attending: Cardiovascular Disease | Admitting: Cardiovascular Disease

## 2015-07-30 ENCOUNTER — Inpatient Hospital Stay (HOSPITAL_COMMUNITY): Payer: Medicare Other

## 2015-07-30 ENCOUNTER — Inpatient Hospital Stay (HOSPITAL_COMMUNITY)
Admission: AD | Admit: 2015-07-30 | Discharge: 2015-07-30 | DRG: 247 | Disposition: A | Discharge status: Home / Self Care | Payer: Medicare Other | Attending: Cardiovascular Disease | Admitting: Cardiovascular Disease

## 2015-07-30 DIAGNOSIS — Z888 Allergy status to other drugs, medicaments and biological substances status: Secondary | ICD-10-CM

## 2015-07-30 DIAGNOSIS — I2510 Atherosclerotic heart disease of native coronary artery without angina pectoris: Secondary | ICD-10-CM | POA: Diagnosis present

## 2015-07-30 DIAGNOSIS — I25110 Atherosclerotic heart disease of native coronary artery with unstable angina pectoris: Secondary | ICD-10-CM | POA: Diagnosis not present

## 2015-07-30 DIAGNOSIS — I2119 ST elevation (STEMI) myocardial infarction involving other coronary artery of inferior wall: Principal | ICD-10-CM | POA: Diagnosis present

## 2015-07-30 DIAGNOSIS — Z79899 Other long term (current) drug therapy: Secondary | ICD-10-CM

## 2015-07-30 DIAGNOSIS — Z8249 Family history of ischemic heart disease and other diseases of the circulatory system: Secondary | ICD-10-CM

## 2015-07-30 DIAGNOSIS — Z96643 Presence of artificial hip joint, bilateral: Secondary | ICD-10-CM | POA: Diagnosis not present

## 2015-07-30 DIAGNOSIS — Z7982 Long term (current) use of aspirin: Secondary | ICD-10-CM | POA: Diagnosis not present

## 2015-07-30 DIAGNOSIS — Z87891 Personal history of nicotine dependence: Secondary | ICD-10-CM

## 2015-07-30 DIAGNOSIS — Z955 Presence of coronary angioplasty implant and graft: Secondary | ICD-10-CM

## 2015-07-30 DIAGNOSIS — R079 Chest pain, unspecified: Secondary | ICD-10-CM

## 2015-07-30 DIAGNOSIS — R0789 Other chest pain: Secondary | ICD-10-CM | POA: Diagnosis not present

## 2015-07-30 DIAGNOSIS — I10 Essential (primary) hypertension: Secondary | ICD-10-CM | POA: Diagnosis present

## 2015-07-30 DIAGNOSIS — I2111 ST elevation (STEMI) myocardial infarction involving right coronary artery: Secondary | ICD-10-CM | POA: Diagnosis not present

## 2015-07-30 DIAGNOSIS — E785 Hyperlipidemia, unspecified: Secondary | ICD-10-CM | POA: Diagnosis present

## 2015-07-30 DIAGNOSIS — I213 ST elevation (STEMI) myocardial infarction of unspecified site: Secondary | ICD-10-CM | POA: Diagnosis not present

## 2015-07-30 HISTORY — DX: Atherosclerotic heart disease of native coronary artery without angina pectoris: I25.10

## 2015-07-30 HISTORY — DX: ST elevation (STEMI) myocardial infarction involving other coronary artery of inferior wall: I21.19

## 2015-07-30 HISTORY — PX: CARDIAC CATHETERIZATION: SHX172

## 2015-07-30 LAB — LIPID PANEL
Cholesterol: 160 mg/dL (ref 0–200)
HDL: 33 mg/dL — AB (ref >40)
LDL CALC: 81 mg/dL (ref 0–99)
TRIGLYCERIDES: 232 mg/dL — AB (ref <150)
Total CHOL/HDL Ratio: 4.8 RATIO
VLDL: 46 mg/dL — ABNORMAL HIGH (ref 0–40)

## 2015-07-30 LAB — CBC
HCT: 44.3 % (ref 39.0–52.0)
HCT: 43.2 % (ref 39.0–52.0)
Hemoglobin: 15.1 g/dL (ref 13.0–17.0)
Hemoglobin: 14.7 g/dL (ref 13.0–17.0)
MCH: 32.4 pg (ref 26.0–34.0)
MCH: 32.2 pg (ref 26.0–34.0)
MCHC: 34.1 g/dL (ref 30.0–36.0)
MCHC: 34 g/dL (ref 30.0–36.0)
MCV: 95.1 fL (ref 78.0–100.0)
MCV: 94.7 fL (ref 78.0–100.0)
PLATELETS: 272 10*3/uL (ref 150–400)
PLATELETS: 248 10*3/uL (ref 150–400)
RBC: 4.66 MIL/uL (ref 4.22–5.81)
RBC: 4.56 MIL/uL (ref 4.22–5.81)
RDW: 12.8 % (ref 11.5–15.5)
RDW: 13 % (ref 11.5–15.5)
WBC: 10.6 10*3/uL — ABNORMAL HIGH (ref 4.0–10.5)
WBC: 9.4 10*3/uL (ref 4.0–10.5)

## 2015-07-30 LAB — DIFFERENTIAL
BASOS PCT: 0 %
Basophils Absolute: 0 10*3/uL (ref 0.0–0.1)
EOS ABS: 0 10*3/uL (ref 0.0–0.7)
EOS PCT: 0 %
LYMPHS PCT: 13 %
Lymphs Abs: 1.3 10*3/uL (ref 0.7–4.0)
MONO ABS: 0.3 10*3/uL (ref 0.1–1.0)
Monocytes Relative: 3 %
NEUTROS PCT: 84 %
Neutro Abs: 8.9 10*3/uL — ABNORMAL HIGH (ref 1.7–7.7)

## 2015-07-30 LAB — TROPONIN I
TROPONIN I: 0.1 ng/mL — AB (ref <0.03)
Troponin I: 48.64 ng/mL (ref <0.03)

## 2015-07-30 LAB — COMPREHENSIVE METABOLIC PANEL
ALK PHOS: 50 U/L (ref 38–126)
ALT: 27 U/L (ref 17–63)
ANION GAP: 11 (ref 5–15)
AST: 29 U/L (ref 15–41)
Albumin: 4.2 g/dL (ref 3.5–5.0)
BILIRUBIN TOTAL: 0.9 mg/dL (ref 0.3–1.2)
BUN: 14 mg/dL (ref 6–20)
CALCIUM: 8.8 mg/dL — AB (ref 8.9–10.3)
CO2: 21 mmol/L — ABNORMAL LOW (ref 22–32)
Chloride: 109 mmol/L (ref 101–111)
Creatinine, Ser: 1.02 mg/dL (ref 0.61–1.24)
GLUCOSE: 132 mg/dL — AB (ref 65–99)
POTASSIUM: 3.8 mmol/L (ref 3.5–5.1)
Sodium: 141 mmol/L (ref 135–145)
TOTAL PROTEIN: 7 g/dL (ref 6.5–8.1)

## 2015-07-30 LAB — CREATININE, SERUM
CREATININE: 0.97 mg/dL (ref 0.61–1.24)
GFR calc Af Amer: >60 mL/min (ref >60)

## 2015-07-30 LAB — APTT: aPTT: 177 seconds (ref 24–37)

## 2015-07-30 LAB — PROTIME-INR
INR: 1.2 (ref 0.00–1.49)
PROTHROMBIN TIME: 15.4 s — AB (ref 11.6–15.2)

## 2015-07-30 LAB — TSH: TSH: 0.594 u[IU]/mL (ref 0.350–4.500)

## 2015-07-30 LAB — BRAIN NATRIURETIC PEPTIDE: B Natriuretic Peptide: 38.9 pg/mL (ref 0.0–100.0)

## 2015-07-30 LAB — MRSA PCR SCREENING: MRSA BY PCR: NEGATIVE

## 2015-07-30 LAB — PLATELET COUNT: Platelets: 235 10*3/uL (ref 150–400)

## 2015-07-30 SURGERY — LEFT HEART CATH AND CORONARY ANGIOGRAPHY
Anesthesia: LOCAL

## 2015-07-30 MED ORDER — SODIUM CHLORIDE 0.9% FLUSH
3.0000 mL | Freq: Two times a day (BID) | INTRAVENOUS | Status: DC
  Administered 2015-07-31 – 2015-08-01 (×3): 3 mL via INTRAVENOUS

## 2015-07-30 MED ORDER — LISINOPRIL 10 MG PO TABS
10.0000 mg | ORAL_TABLET | Freq: Every day | ORAL | Status: DC
  Administered 2015-07-31 – 2015-08-01 (×2): 10 mg via ORAL
  Filled 2015-07-30 (×2): qty 1

## 2015-07-30 MED ORDER — VERAPAMIL HCL 2.5 MG/ML IV SOLN
INTRAVENOUS | Status: DC | PRN
  Administered 2015-07-30: 10 mL via INTRA_ARTERIAL

## 2015-07-30 MED ORDER — SODIUM CHLORIDE 0.9 % IV SOLN: 250.0000 mL | INTRAVENOUS | Status: DC | PRN

## 2015-07-30 MED ORDER — TIROFIBAN HCL IN NACL 5-0.9 MG/100ML-% IV SOLN
INTRAVENOUS | Status: AC
  Filled 2015-07-30: qty 100

## 2015-07-30 MED ORDER — ASPIRIN 81 MG PO CHEW
81.0000 mg | CHEWABLE_TABLET | Freq: Every day | ORAL | Status: DC
  Administered 2015-07-31 – 2015-08-01 (×2): 81 mg via ORAL
  Filled 2015-07-30 (×2): qty 1

## 2015-07-30 MED ORDER — MIDAZOLAM HCL 2 MG/2ML IJ SOLN
INTRAMUSCULAR | Status: AC
  Filled 2015-07-30: qty 2

## 2015-07-30 MED ORDER — NITROGLYCERIN 1 MG/10 ML FOR IR/CATH LAB
INTRA_ARTERIAL | Status: AC
  Filled 2015-07-30: qty 10

## 2015-07-30 MED ORDER — DIAZEPAM 5 MG PO TABS: 5.0000 mg | ORAL_TABLET | Freq: Three times a day (TID) | ORAL | Status: DC | PRN

## 2015-07-30 MED ORDER — IOPAMIDOL (ISOVUE-370) INJECTION 76%
INTRAVENOUS | Status: DC | PRN
  Administered 2015-07-30: 125 mL via INTRA_ARTERIAL

## 2015-07-30 MED ORDER — MIDAZOLAM HCL 2 MG/2ML IJ SOLN
INTRAMUSCULAR | Status: DC | PRN
  Administered 2015-07-30: 2 mg via INTRAVENOUS

## 2015-07-30 MED ORDER — TICAGRELOR 90 MG PO TABS
90.0000 mg | ORAL_TABLET | Freq: Two times a day (BID) | ORAL | Status: DC
  Administered 2015-07-30 – 2015-08-01 (×4): 90 mg via ORAL
  Filled 2015-07-30 (×4): qty 1

## 2015-07-30 MED ORDER — ATROPINE SULFATE 1 MG/10ML IJ SOSY
PREFILLED_SYRINGE | INTRAMUSCULAR | Status: AC
  Filled 2015-07-30: qty 10

## 2015-07-30 MED ORDER — ACETAMINOPHEN 325 MG PO TABS: 650.0000 mg | ORAL_TABLET | ORAL | Status: DC | PRN

## 2015-07-30 MED ORDER — ONDANSETRON HCL 4 MG/2ML IJ SOLN: 4.0000 mg | Freq: Four times a day (QID) | INTRAMUSCULAR | Status: DC | PRN

## 2015-07-30 MED ORDER — VERAPAMIL HCL 2.5 MG/ML IV SOLN
INTRAVENOUS | Status: AC
  Filled 2015-07-30: qty 2

## 2015-07-30 MED ORDER — TIROFIBAN HCL IN NACL 5-0.9 MG/100ML-% IV SOLN
INTRAVENOUS | Status: DC | PRN
  Administered 2015-07-30: 0.15 ug/kg/min via INTRAVENOUS

## 2015-07-30 MED ORDER — TIROFIBAN (AGGRASTAT) BOLUS VIA INFUSION
INTRAVENOUS | Status: DC | PRN
  Administered 2015-07-30: 2517.5 ug via INTRAVENOUS

## 2015-07-30 MED ORDER — TICAGRELOR 90 MG PO TABS
ORAL_TABLET | ORAL | Status: DC | PRN
  Administered 2015-07-30: 180 mg via ORAL

## 2015-07-30 MED ORDER — ATORVASTATIN CALCIUM 80 MG PO TABS
80.0000 mg | ORAL_TABLET | Freq: Every day | ORAL | Status: DC
  Administered 2015-07-30 – 2015-07-31 (×2): 80 mg via ORAL
  Filled 2015-07-30 (×2): qty 1

## 2015-07-30 MED ORDER — SODIUM CHLORIDE 0.9 % WEIGHT BASED INFUSION
3.0000 mL/kg/h | INTRAVENOUS | Status: DC
  Administered 2015-07-30: 3 mL/kg/h via INTRAVENOUS

## 2015-07-30 MED ORDER — TICAGRELOR 90 MG PO TABS
ORAL_TABLET | ORAL | Status: AC
  Filled 2015-07-30: qty 2

## 2015-07-30 MED ORDER — ATROPINE SULFATE 1 MG/10ML IJ SOSY
PREFILLED_SYRINGE | INTRAMUSCULAR | Status: DC | PRN
  Administered 2015-07-30: 0.5 mg via INTRAVENOUS

## 2015-07-30 MED ORDER — METOPROLOL TARTRATE 12.5 MG HALF TABLET
12.5000 mg | ORAL_TABLET | Freq: Two times a day (BID) | ORAL | Status: DC
  Administered 2015-07-30 – 2015-08-01 (×4): 12.5 mg via ORAL
  Filled 2015-07-30 (×4): qty 1

## 2015-07-30 MED ORDER — HEPARIN SODIUM (PORCINE) 1000 UNIT/ML IJ SOLN
INTRAMUSCULAR | Status: AC
  Filled 2015-07-30: qty 1

## 2015-07-30 MED ORDER — FENTANYL CITRATE (PF) 100 MCG/2ML IJ SOLN
INTRAMUSCULAR | Status: DC | PRN
  Administered 2015-07-30: 50 ug via INTRAVENOUS

## 2015-07-30 MED ORDER — SODIUM CHLORIDE 0.9 % IV SOLN
INTRAVENOUS | Status: DC | PRN
  Administered 2015-07-30: 500 mL via INTRAVENOUS

## 2015-07-30 MED ORDER — HEPARIN SODIUM (PORCINE) 5000 UNIT/ML IJ SOLN
5000.0000 [IU] | Freq: Three times a day (TID) | INTRAMUSCULAR | Status: DC
  Administered 2015-07-31: 5000 [IU] via SUBCUTANEOUS
  Filled 2015-07-30: qty 1

## 2015-07-30 MED ORDER — SODIUM CHLORIDE 0.9% FLUSH: 3.0000 mL | INTRAVENOUS | Status: DC | PRN

## 2015-07-30 MED ORDER — HEPARIN (PORCINE) IN NACL 2-0.9 UNIT/ML-% IJ SOLN
INTRAMUSCULAR | Status: AC
  Filled 2015-07-30: qty 500

## 2015-07-30 MED ORDER — NITROGLYCERIN 0.4 MG SL SUBL: 0.4000 mg | SUBLINGUAL_TABLET | SUBLINGUAL | Status: DC | PRN

## 2015-07-30 MED ORDER — FENTANYL CITRATE (PF) 100 MCG/2ML IJ SOLN
INTRAMUSCULAR | Status: AC
  Filled 2015-07-30: qty 2

## 2015-07-30 MED ORDER — LIDOCAINE HCL (PF) 1 % IJ SOLN
INTRAMUSCULAR | Status: AC
  Filled 2015-07-30: qty 30

## 2015-07-30 MED ORDER — TIROFIBAN HCL IN NACL 5-0.9 MG/100ML-% IV SOLN
0.1500 ug/kg/min | INTRAVENOUS | Status: DC
  Administered 2015-07-30 – 2015-07-31 (×2): 0.15 ug/kg/min via INTRAVENOUS
  Filled 2015-07-30 (×2): qty 100

## 2015-07-30 MED ORDER — HEPARIN SODIUM (PORCINE) 1000 UNIT/ML IJ SOLN
INTRAMUSCULAR | Status: DC | PRN
  Administered 2015-07-30: 2000 [IU] via INTRAVENOUS
  Administered 2015-07-30 (×2): 4000 [IU] via INTRAVENOUS

## 2015-07-30 MED ORDER — HEPARIN (PORCINE) IN NACL 2-0.9 UNIT/ML-% IJ SOLN
INTRAMUSCULAR | Status: DC | PRN
  Administered 2015-07-30: 1000 mL

## 2015-07-30 MED ORDER — OXYCODONE-ACETAMINOPHEN 5-325 MG PO TABS: 1.0000 | ORAL_TABLET | ORAL | Status: DC | PRN

## 2015-07-30 MED ORDER — IOPAMIDOL (ISOVUE-370) INJECTION 76%
INTRAVENOUS | Status: AC
  Filled 2015-07-30: qty 125

## 2015-07-30 SURGICAL SUPPLY — 19 items
BALLN EMERGE MR 2.5X12 (BALLOONS) ×2
BALLN ~~LOC~~ EUPHORA RX 3.5X12 (BALLOONS) ×2
BALLOON EMERGE MR 2.5X12 (BALLOONS) IMPLANT
BALLOON ~~LOC~~ EUPHORA RX 3.5X12 (BALLOONS) IMPLANT
CATH INFINITI 5 FR JL3.5 (CATHETERS) ×1 IMPLANT
CATH INFINITI 5FR ANG PIGTAIL (CATHETERS) ×1 IMPLANT
CATH VISTA GUIDE 6FR JR4 (CATHETERS) ×1 IMPLANT
DEVICE RAD COMP TR BAND LRG (VASCULAR PRODUCTS) ×1 IMPLANT
ELECT DEFIB PAD ADLT CADENCE (PAD) ×1 IMPLANT
GLIDESHEATH SLEND SS 6F .021 (SHEATH) ×1 IMPLANT
KIT ENCORE 26 ADVANTAGE (KITS) ×1 IMPLANT
KIT HEART LEFT (KITS) ×2 IMPLANT
PACK CARDIAC CATHETERIZATION (CUSTOM PROCEDURE TRAY) ×2 IMPLANT
STENT PROMUS PREM MR 3.5X16 (Permanent Stent) ×1 IMPLANT
SYR MEDRAD MARK V 150ML (SYRINGE) ×2 IMPLANT
TRANSDUCER W/STOPCOCK (MISCELLANEOUS) ×2 IMPLANT
TUBING CIL FLEX 10 FLL-RA (TUBING) ×2 IMPLANT
WIRE COUGAR XT STRL 190CM (WIRE) ×1 IMPLANT
WIRE SAFE-T 1.5MM-J .035X260CM (WIRE) ×1 IMPLANT

## 2015-07-30 NOTE — Progress Notes (Signed)
CRITICAL VALUE ALERT  Critical value received:  Troponin 48.6   Date of notification:  07/30/15  Time of notification:  2000  Critical value read back:yes  Nurse who received alert:  Idelia Salm RN   MD notified: Cardiology Fellow notified at 2000, this is an excepted find pt is s/p PCI to RCA.

## 2015-07-30 NOTE — ED Notes (Signed)
Pt straight to Cath lab.  Dr. Burt Knack met EMS on dock.

## 2015-07-30 NOTE — H&P (Signed)
History and Physical  Patient ID: Mike Ray MRN: [id number], SOB: [birth year] 65 y.o. Date of Encounter: 07/30/2015, 4:42 PM  Primary Physician: Unice Cobble, MD Primary Cardiologist: new Burt Knack)  Chief Complaint: Chest pain  HPI: 65 y.o. male w/ PMHx significant for HTN who presented to [facility] on 07/30/2015 with complaints of chest pain.  The patient has no past cardiac history. He has had intermittent chest discomfort over the last 48 hours. The patient works as a Development worker, international aid, and he did hard physical work for 3 hours this morning. Near the end of his work, he developed a pressure-like sensation in his jaw, teeth, and chest. He became diaphoretic. He took a cold shower and symptoms persisted. He spoke with his wife and they decided to call EMS. Upon EMS arrival, there was inferolateral ST elevation and a code STEMI was called. The patient is brought directly to the cardiac catheterization lab.  The patient is interviewed on arrival to the hospital. At worst he describes his pain at 4/10 and substernal, nonradiating. At present his pain is 2/10. There was associated nausea and diaphoresis but no vomiting, shortness of breath, or presyncope. He received morphine 6 mg, aspirin 324 mg, and sublingual nitroglycerin in route. He has never had similar symptoms.  Cardiovascular risk factors include former tobacco use (quit 2002), hypertension, hyperlipidemia, and strong family history of CAD.   Past Medical History  Diagnosis Date  . Hyperlipidemia     NMR 06/2009: LDL [id number]), HDL 40, TG 127. Framingham Study LDL goal =< 130   . Gilbert's syndrome   . Elevated PSA     Dr Barnie Del  . History of nephrolithiasis   . History of heat stroke 2009  . Osteoarthritis of left hip   . Hypertension   . Acute MI, inferolateral wall, initial episode of care [facility]) 07/30/2015     Surgical History:  Past Surgical History  Procedure Laterality Date  . Lithotripsy  2009   Dr.Duckett, [place]   . Total hip arthroplasty  2008    left  . Total hip arthroplasty  2004    right  . Colonoscopy  2010    negative     Home Meds: Prior to Admission medications   Medication Sig Start Date End Date Taking? Authorizing Provider  aspirin 325 MG tablet Take 325 mg by mouth daily.    Historical Provider, MD  B Complex-C (B-COMPLEX WITH VITAMIN C) tablet Take 1 tablet by mouth daily.    Historical Provider, MD  diclofenac (VOLTAREN) 75 MG EC tablet Take 75 mg by mouth 2 (two) times daily.    Historical Provider, MD  fish oil-omega-3 fatty acids 1000 MG capsule Take 2 g by mouth every other day.     Historical Provider, MD  lisinopril (PRINIVIL,ZESTRIL) 20 MG tablet Take 1 tablet (20 mg total) by mouth daily. 07/07/15   Binnie Rail, MD  Multiple Vitamin (MULTIVITAMIN) tablet Take 1 tablet by mouth daily.    Historical Provider, MD  rosuvastatin (CRESTOR) 10 MG tablet 1 every 7 days 05/09/13   Hendricks Limes, MD    Allergies:  Allergies  Allergen Reactions  . Naproxen Sodium     REACTION: abdominal cramping    Social History   Social History  . Marital Status: Divorced    Spouse Name: N/A  . Number of Children: N/A  . Years of Education: N/A   Occupational History  . Not on file.   Social History  Main Topics  . Smoking status: Former Smoker    Quit date: 01/26/2000  . Smokeless tobacco: Not on file     Comment: Age 12  . Alcohol Use: 9.6 oz/week    16 Cans of beer per week  . Drug Use: No  . Sexual Activity: Not on file   Other Topics Concern  . Not on file   Social History Narrative     Family History  Problem Relation Age of Onset  . Heart attack Father 30    smoker  . Diabetes Mother   . Hypertension Mother   . Prostate cancer Brother 72    CAD; stents @ 45  . Hypertension Brother   . Heart attack Brother 19  . Coronary artery disease Brother     stents late 80s  . Stroke Neg Hx     Review of Systems: General: negative for  chills, fever, night sweats or weight changes.  ENT: negative for rhinorrhea or epistaxis Cardiovascular: See history of present illness Dermatological: negative for rash Respiratory: negative for cough or wheezing GI: negative vomiting, diarrhea, bright red blood per rectum, melena, or hematemesis. Positive for nausea GU: no hematuria, urgency, or frequency Neurologic: negative for visual changes, syncope, headache, or dizziness Heme: no easy bruising or bleeding Endo: negative for excessive thirst, thyroid disorder, or flushing Musculoskeletal: negative for joint pain or swelling, negative for myalgias All other systems reviewed and are otherwise negative except as noted above.  Physical Exam: Blood pressure 121/85, pulse 97, resp. rate 13, SpO2 94 %. General: Well developed, well nourished, alert and oriented, in no acute distress. HEENT: Normocephalic, atraumatic, sclera anicteric Neck: Supple. Carotids 2+ without bruits. JVP normal Lungs: Clear bilaterally to auscultation without wheezes, rales, or rhonchi. Breathing is unlabored. Heart: RRR with normal S1 and S2. No murmurs, rubs, or gallops appreciated. Abdomen: Soft, non-tender, non-distended with normoactive bowel sounds. No hepatomegaly. No rebound/guarding. No obvious abdominal masses. Back: No CVA tenderness Msk:  Strength and tone appear normal for age. Extremities: No clubbing, cyanosis, or edema.  Distal pedal pulses are 2+ and equal bilaterally. Neuro: CNII-XII intact, moves all extremities spontaneously. Psych:  Responds to questions appropriately with a normal affect. Skin: warm and dry without rash   Labs:   Lab Results  Component Value Date   WBC 10.6* 07/30/2015   HGB 15.1 07/30/2015   HCT 44.3 07/30/2015   MCV 95.1 07/30/2015   PLT 272 07/30/2015    Recent Labs Lab 07/30/15 1546  NA 141  K 3.8  CL 109  CO2 21*  BUN 14  CREATININE 1.02  CALCIUM 8.8*  PROT 7.0  BILITOT 0.9  ALKPHOS 50  ALT 27    AST 29  GLUCOSE 132*    Recent Labs  07/30/15 1546  TROPONINI 0.10*   Lab Results  Component Value Date   CHOL 160 07/30/2015   HDL 33* 07/30/2015   LDLCALC 81 07/30/2015   TRIG 232* 07/30/2015   No results found for: DDIMER  Radiology/Studies:  No results found.   EKG: Normal sinus rhythm with acute inferoposterior/lateral STEMI pattern  CARDIAC STUDIES: Pending  ASSESSMENT AND PLAN:  1. Acute inferolateral/posterior STEMI: Patient with ongoing ischemic symptoms on arrival. Emergency cardiac catheterization and PCI will be performed. Risks and indications are reviewed with the patient. Emergency implied consent is obtained. He will be administered heparin, beta blocker, high intensity statin drug, and antiplatelet therapy pending findings of cardiac catheterization.  2. Essential hypertension: Continue ACE inhibitor. Likely  add low-dose beta blocker pending hemodynamic stability.  3. Hyperlipidemia: We'll check lipids and LFTs. Plan to start a high intensity statin drug.  Deatra James MD 07/30/2015, 4:42 PM

## 2015-07-31 ENCOUNTER — Inpatient Hospital Stay (HOSPITAL_COMMUNITY): Payer: Medicare Other

## 2015-07-31 ENCOUNTER — Encounter (HOSPITAL_COMMUNITY): Payer: Self-pay | Admitting: Cardiovascular Disease

## 2015-07-31 DIAGNOSIS — I10 Essential (primary) hypertension: Secondary | ICD-10-CM

## 2015-07-31 DIAGNOSIS — I213 ST elevation (STEMI) myocardial infarction of unspecified site: Secondary | ICD-10-CM

## 2015-07-31 LAB — LIPID PANEL
CHOL/HDL RATIO: 4.2 ratio
CHOLESTEROL: 118 mg/dL (ref 0–200)
HDL: 28 mg/dL — AB (ref >40)
LDL Cholesterol: 44 mg/dL (ref 0–99)
TRIGLYCERIDES: 230 mg/dL — AB (ref <150)
VLDL: 46 mg/dL — AB (ref 0–40)

## 2015-07-31 LAB — ECHOCARDIOGRAM COMPLETE
E decel time: 275 msec
EERAT: 7.78
FS: 24 % — AB (ref 28–44)
HEIGHTINCHES: 71 in
IVS/LV PW RATIO, ED: 1.16
LA ID, A-P, ES: 42 mm
LA vol index: 31.4 mL/m2
LADIAMINDEX: 1.86 cm/m2
LAVOL: 71.1 mL
LAVOLA4C: 69.3 mL
LDCA: 4.15 cm2
LEFT ATRIUM END SYS DIAM: 42 mm
LV E/e' medial: 7.78
LV E/e'average: 7.78
LV PW d: 8.39 mm — AB (ref 0.6–1.1)
LV TDI E'MEDIAL: 7.51
LVELAT: 8.92 cm/s
LVOTD: 23 mm
MV Dec: 275
MV pk A vel: 66 m/s
MV pk E vel: 69.4 m/s
RV LATERAL S' VELOCITY: 12.7 cm/s
RV TAPSE: 20.9 mm
TDI e' lateral: 8.92
WEIGHTICAEL: 3527.36 [oz_av]

## 2015-07-31 LAB — CBC
HCT: 42.6 % (ref 39.0–52.0)
Hemoglobin: 14.2 g/dL (ref 13.0–17.0)
MCH: 32.1 pg (ref 26.0–34.0)
MCHC: 33.3 g/dL (ref 30.0–36.0)
MCV: 96.2 fL (ref 78.0–100.0)
PLATELETS: 222 10*3/uL (ref 150–400)
RBC: 4.43 MIL/uL (ref 4.22–5.81)
RDW: 13.2 % (ref 11.5–15.5)
WBC: 9.4 10*3/uL (ref 4.0–10.5)

## 2015-07-31 LAB — BASIC METABOLIC PANEL
Anion gap: 7 (ref 5–15)
BUN: 13 mg/dL (ref 6–20)
CALCIUM: 8.7 mg/dL — AB (ref 8.9–10.3)
CHLORIDE: 109 mmol/L (ref 101–111)
CO2: 21 mmol/L — AB (ref 22–32)
CREATININE: 0.78 mg/dL (ref 0.61–1.24)
GFR calc Af Amer: >60 mL/min (ref >60)
GFR calc non Af Amer: >60 mL/min (ref >60)
GLUCOSE: 124 mg/dL — AB (ref 65–99)
Potassium: 4.1 mmol/L (ref 3.5–5.1)
Sodium: 137 mmol/L (ref 135–145)

## 2015-07-31 LAB — TROPONIN I: Troponin I: >65 ng/mL (ref <0.03)

## 2015-07-31 LAB — HEMOGLOBIN A1C
Hgb A1c MFr Bld: 5.7 % — ABNORMAL HIGH (ref 4.8–5.6)
Hgb A1c MFr Bld: 5.4 % (ref 4.8–5.6)
MEAN PLASMA GLUCOSE: 117 mg/dL
Mean Plasma Glucose: 108 mg/dL

## 2015-07-31 MED ORDER — ALUM & MAG HYDROXIDE-SIMETH 200-200-20 MG/5ML PO SUSP
30.0000 mL | ORAL | Status: DC | PRN
  Administered 2015-07-31: 30 mL via ORAL
  Filled 2015-07-31 (×2): qty 30

## 2015-07-31 NOTE — Progress Notes (Addendum)
CARDIAC REHAB PHASE I   PRE:  Rate/Rhythm: 66 SR    BP: in BR    SaO2:   MODE:  Ambulation: 350 ft   POST:  Rate/Rhythm: 86 SR    BP: sitting 140/90     SaO2:   Pt up in room. To bathroom then ambulated without c/o. BP elevated. Ed completed with good reception. Pt runs landscaping business and has questions regarding returning to estimates, etc. He is thinking about CRPII and I will send referral to [facility]. He likes to swim or walk in the pool for his arthritis. Understands the importance of Brilinta/asa. Also discussed other meds, diet and ex. Will f/u tomorrow for questions. Pt now lying down and having PVCs, short runs NSVT.  [place], ACSM 07/31/2015 2:46 PM

## 2015-07-31 NOTE — Progress Notes (Signed)
  Echocardiogram 2D Echocardiogram has been performed.  Mike Ray 07/31/2015, 10:37 AM

## 2015-07-31 NOTE — Progress Notes (Signed)
Cardiology fellow (Dr. Susy Manor) made aware of new onset chest tightness. Mike Ray denies actual chest pain, shortness of breath, nausea. STAT EKG obtained. He states that this chest discomfort is not as bad as what brought him to the hospital, but that it is similar. Dr. Susy Manor to bedside to see patient. Will continue to closely monitor. Richarda Blade RN

## 2015-07-31 NOTE — Progress Notes (Signed)
Left pt 30day free and copay card for brilinta 90mg  bid.

## 2015-07-31 NOTE — Progress Notes (Signed)
SUBJECTIVE: Chest pressure several hours ago with nausea but feeling much better. No chest pain currently. No dyspnea.   Tele: sinus  BP 120/98 mmHg  Pulse 63  Temp(Src) 97.9 F (36.6 C) (Oral)  Resp 18  Ht '5\' 11"'$  (1.803 m)  Wt 220 lb 7.4 oz (100 kg)  BMI 30.76 kg/m2  SpO2 98%  Intake/Output Summary (Last 24 hours) at 07/31/15 0732 Last data filed at 07/31/15 0700  Gross per 24 hour  Intake 1734.1 ml  Output    375 ml  Net 1359.1 ml    PHYSICAL EXAM General: Well developed, well nourished, in no acute distress. Alert and oriented x 3.  Psych:  Good affect, responds appropriately Neck: No JVD. No masses noted.  Lungs: Clear bilaterally with no wheezes or rhonci noted.  Heart: RRR with no murmurs noted. Abdomen: Bowel sounds are present. Soft, non-tender.  Extremities: No lower extremity edema. Right wrist cath site ok without hematoma.   LABS: Basic Metabolic Panel:  Recent Labs  07/30/15 1546 07/30/15 1830 07/31/15 0535  NA 141  --  137  K 3.8  --  4.1  CL 109  --  109  CO2 21*  --  21*  GLUCOSE 132*  --  124*  BUN 14  --  13  CREATININE 1.02 0.97 0.78  CALCIUM 8.8*  --  8.7*   CBC:  Recent Labs  07/30/15 1546 07/30/15 1830 07/30/15 2215 07/31/15 0535  WBC 10.6* 9.4  --  9.4  NEUTROABS 8.9*  --   --   --   HGB 15.1 14.7  --  14.2  HCT 44.3 43.2  --  42.6  MCV 95.1 94.7  --  96.2  PLT 272 248 235 222   Cardiac Enzymes:  Recent Labs  07/30/15 1830 07/31/15 0002 07/31/15 0535  TROPONINI 48.64* 80.78* >65.00*   Fasting Lipid Panel:  Recent Labs  07/31/15 0002  CHOL 118  HDL 28*  LDLCALC 44  TRIG 230*  CHOLHDL 4.2    Current Meds: . aspirin  81 mg Oral Daily  . atorvastatin  80 mg Oral q1800  . heparin  5,000 Units Subcutaneous Q8H  . lisinopril  10 mg Oral Daily  . metoprolol tartrate  12.5 mg Oral BID  . sodium chloride flush  3 mL Intravenous Q12H  . ticagrelor  90 mg Oral BID   Cardiac cath 07/30/15: Left Main  The  left mainstem is calcified with no obstructive disease. The left main divides into the LAD and left circumflex.      Left Anterior Descending   . Mid LAD lesion, 30% stenosed. The LAD reaches the left ventricular apex. The vessel supplies a large first diagonal branch. The mid LAD has 30% stenosis. The vessel has no high-grade obstruction.   . First Diagonal Branch   . 1st Diag lesion, 35% stenosed. Diffuse.     Ramus Intermedius  The ramus intermedius branch is small in caliber with no high-grade obstruction.     Left Circumflex   . Prox Cx to Mid Cx lesion, 25% stenosed. The left circumflex is moderately calcified. The vessel has scattered nonobstructive plaques in the proximal and mid circumflex with no more than 25% stenosis present.     Right Coronary Artery   . Dist RCA lesion, 100% stenosed. With heavy thrombus. The distal RCA is occluded. The occlusion appears acute based on contrast staining pattern and tapered beak at the occlusion. After reperfusion, the distal PDA  is occluded and this is unchanged following stenting. The distal occlusion of the PDA is in such a distal segment that PCI was not attempted.   Marland Kitchen PCI: There is no pre-interventional antegrade distal flow (TIMI 0). Pre-stent angioplasty was performed. A drug-eluting stent was placed. Post-stent angioplasty was performed. Maximum pressure: 14 atm. The post-interventional distal flow is normal (TIMI 3). The intervention was successful. No complications occurred at this lesion. A JR4 guide catheter is used. Heparin and Aggrastat are used for anticoagulation. A therapeutic ACT is achieved (230). A cougar guidewire is advanced across the total occlusion of the distal RCA without difficulty. The lesion is predilated with a 2.5 x 12 mm balloon. The patient developed bradycardia and hypotension after reperfusion. 0.5 mg of atropine is administered. 0.9% saline boluses administered. The lesion is stented with a 3.5 x 16 mm Promus DES,  carefully positioned just proximal to the distal RCA trifurcation with complete lesion coverage. The stent is deployed at 12 atm. The stent is postdilated with a 3.5 x 12 mm noncompliant balloon to 14 atm.  . There is no residual stenosis post intervention.         ASSESSMENT AND PLAN:  1. CAD/Acute inferolateral STEMI: Pt admitted 07/30/15 with acute inferolateral STEMI secondary to occluded distal RCA. Troponin over 65. DES placed x 1 in the distal RCA. Minimal disease in the LAD and Circumflex. He is stable this am. Will continue DAPT with ASA and Brilinta for at least one year Will continue high intensity statin, beta blocker and Ace-inh. Echo today to assess LVEF.    2. HTN: BP controlled on current therapy. No changes today.   Dispo: Transfer to telemetry unit after lunch if stable.       Lauree Chandler  7/6/20177:32 AM

## 2015-08-01 ENCOUNTER — Telehealth: Payer: Self-pay | Admitting: Nurse Practitioner

## 2015-08-01 ENCOUNTER — Telehealth: Payer: Self-pay

## 2015-08-01 DIAGNOSIS — I25110 Atherosclerotic heart disease of native coronary artery with unstable angina pectoris: Secondary | ICD-10-CM

## 2015-08-01 LAB — BASIC METABOLIC PANEL
Anion gap: 5 (ref 5–15)
BUN: 10 mg/dL (ref 6–20)
CO2: 24 mmol/L (ref 22–32)
Calcium: 8.6 mg/dL — ABNORMAL LOW (ref 8.9–10.3)
Chloride: 109 mmol/L (ref 101–111)
Creatinine, Ser: 0.82 mg/dL (ref 0.61–1.24)
GFR calc Af Amer: >60 mL/min (ref >60)
GFR calc non Af Amer: >60 mL/min (ref >60)
Glucose, Bld: 102 mg/dL — ABNORMAL HIGH (ref 65–99)
Potassium: 4 mmol/L (ref 3.5–5.1)
Sodium: 138 mmol/L (ref 135–145)

## 2015-08-01 LAB — CBC
HCT: 40.4 % (ref 39.0–52.0)
HEMOGLOBIN: 13.5 g/dL (ref 13.0–17.0)
MCH: 32 pg (ref 26.0–34.0)
MCHC: 33.4 g/dL (ref 30.0–36.0)
MCV: 95.7 fL (ref 78.0–100.0)
Platelets: 198 10*3/uL (ref 150–400)
RBC: 4.22 MIL/uL (ref 4.22–5.81)
RDW: 13.2 % (ref 11.5–15.5)
WBC: 7.4 10*3/uL (ref 4.0–10.5)

## 2015-08-01 LAB — POCT I-STAT, CHEM 8
BUN: 17 mg/dL (ref 6–20)
CALCIUM ION: 1.12 mmol/L (ref 1.12–1.23)
CHLORIDE: 108 mmol/L (ref 101–111)
Creatinine, Ser: 0.9 mg/dL (ref 0.61–1.24)
GLUCOSE: 125 mg/dL — AB (ref 65–99)
HCT: 45 % (ref 39.0–52.0)
Hemoglobin: 15.3 g/dL (ref 13.0–17.0)
POTASSIUM: 3.7 mmol/L (ref 3.5–5.1)
Sodium: 144 mmol/L (ref 135–145)
TCO2: 22 mmol/L (ref 0–100)

## 2015-08-01 LAB — POCT ACTIVATED CLOTTING TIME: Activated Clotting Time: 230 seconds

## 2015-08-01 MED ORDER — TICAGRELOR 90 MG PO TABS: 90.0000 mg | ORAL_TABLET | Freq: Two times a day (BID) | ORAL | Status: DC

## 2015-08-01 MED ORDER — METOPROLOL TARTRATE 25 MG PO TABS: 12.5000 mg | ORAL_TABLET | Freq: Two times a day (BID) | ORAL | Status: DC

## 2015-08-01 MED ORDER — NITROGLYCERIN 0.4 MG SL SUBL: 0.4000 mg | SUBLINGUAL_TABLET | SUBLINGUAL | Status: DC | PRN

## 2015-08-01 MED ORDER — BENAZEPRIL HCL 10 MG PO TABS: 10.0000 mg | ORAL_TABLET | Freq: Every day | ORAL | Status: DC

## 2015-08-01 MED ORDER — ATORVASTATIN CALCIUM 80 MG PO TABS: 80.0000 mg | ORAL_TABLET | Freq: Every day | ORAL | Status: DC

## 2015-08-01 NOTE — Telephone Encounter (Signed)
Pt is on TCM List. DC instructions - follow up with cardiology for cardiac rehab.

## 2015-08-01 NOTE — Telephone Encounter (Signed)
New message     TCM appt on 08-11-15 with Kaleen Mask.

## 2015-08-01 NOTE — Progress Notes (Signed)
Discharge paperwork gone over with patient and wife in detail. All questions answered to patient satisfaction. Telemetry discontinued. IV removed intact. Patient discharged to home with wife by way of wheelchair.

## 2015-08-01 NOTE — Progress Notes (Signed)
[id number] Followed up with pt and wife. No questions re education received yesterday. Excited to be going home. Graylon Good RN BSN 08/01/2015 10:04 AM

## 2015-08-01 NOTE — Discharge Summary (Signed)
Discharge Summary    Patient ID: Mike Ray,  MRN: [id number], DOB/AGE: 65-Dec-1952 65 y.o.  Admit date: 07/30/2015 Discharge date: 08/01/2015  Primary Care Provider: Unice Cobble Primary Cardiologist: Dr. Burt Knack  Discharge Diagnoses    Active Problems:   Acute MI, inferolateral wall, initial episode of care [facility])   Allergies Allergies  Allergen Reactions  . Naproxen Sodium     REACTION: abdominal cramping    Diagnostic Studies/Procedures  Coronary Stent Intervention Left Heart Cath and Coronary Angiography 07/30/15   Prox Cx to Mid Cx lesion, 25% stenosed.  Mid LAD lesion, 30% stenosed.  1st Diag lesion, 35% stenosed.  There is mild left ventricular systolic dysfunction.  Dist RCA lesion, 100% stenosed. Post intervention, there is a 0% residual stenosis.  1. Acute inferolateral STEMI secondary to total occlusion of the distal RCA, treated successfully with primary PCI using a drug-eluting stent  2. Mild nonobstructive stenosis of the LAD and left circumflex  3. Mild segmental contraction abnormality left ventricle with well-preserved overall LVEF  Recommendations: Post MI medical therapy. Aspirin and brilinta for at least 12 months. If hospital course is uncomplicated, patient could be transferred out of the ICU tomorrow and discharged home in 48 hours.   Transthoracic Echocardiography 07/31/15 Study Conclusions  - Left ventricle: The cavity size was normal. Systolic function was  normal. The estimated ejection fraction was in the range of 55%  to 60%. Wall motion was normal; there were no regional wall  motion abnormalities. Left ventricular diastolic function  parameters were normal. - Aortic valve: There was trivial regurgitation. - Left atrium: The atrium was mildly dilated. - Atrial septum: No defect or patent foramen ovale was identified _____________   History of Present Illness   Mr. Mike Ray is a 65 year old male with a history of  HTN. No prior history of CAD, he does have a strong family history of CAD. He presented to the ED on 07/30/15 with chest pain for the last 48 hours. 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 He had associated diaphoresis. He took a cold shower but his symptoms persisted, and called EMS. Upon arrival of EMS there was inferolateral ST elevation and a code STEMI was called.  Hospital Course  He was taken urgently to the cath lab. He received drug-eluting stent to his is to RCA. There is some residual mild nonobstructive disease in his LAD. Cath report above.   His troponin was elevated at 48.64 and peaked at greater than 65. He did have some chest pain following PCI, his pain was not as bad as brought to the hospital. This has resolved.  Echocardiogram shows preserved EF of 55-60%. Left ventricular diastolic function was normal.   He will be on dual antiplatelet therapy with aspirin and present for at least one year. He was started on metoprolol. He takes an ACE inhibitor already at home, this was continued at discharge. Note that at home he was on lisinopril at home, however at discharge his insurance would no longer cover lisinopril so he was changed to benazepril. We will continue benazepril at 10 mg. His Voltaren was discontinued at discharge as he was started on dual antiplatelet therapy. He was also started on high intensity statin.  He has been educated by cardiac rehabilitation. He had multiple questions at the time of discharge about work restrictions  and specifically about how much alcohol he could consume weekly. I advised him to limit alcohol to 2-3 drinks per week. Regarding work restrictions he states that he owns the company so can take it easy for "a while" I advised him to not work for the next 2 weeks and can revisit work activity at follow-up appointment.  He was  seen today by Dr.McAlhany and deemed suitable for discharge. He will follow-up with our office in 10 days. _____________  Discharge Vitals Blood pressure 109/74, pulse 76, temperature 98 F (36.7 C), temperature source Oral, resp. rate 18, height 5\' 11"  (1.803 m), weight 219 lb 4.8 oz (99.474 kg), SpO2 98 %.  Filed Weights   07/30/15 1700 08/01/15 0513  Weight: 220 lb 7.4 oz (100 kg) 219 lb 4.8 oz (99.474 kg)    Labs & Radiologic Studies     CBC  Recent Labs  07/30/15 1546  07/31/15 0535 08/01/15 0405  WBC 10.6*  < > 9.4 7.4  NEUTROABS 8.9*  --   --   --   HGB 15.1  < > 14.2 13.5  HCT 44.3  < > 42.6 40.4  MCV 95.1  < > 96.2 95.7  PLT 272  < > 222 198  < > = values in this interval not displayed. Basic Metabolic Panel  Recent Labs  07/31/15 0535 08/01/15 0405  NA 137 138  K 4.1 4.0  CL 109 109  CO2 21* 24  GLUCOSE 124* 102*  BUN 13 10  CREATININE 0.78 0.82  CALCIUM 8.7* 8.6*   Liver Function Tests  Recent Labs  07/30/15 1546  AST 29  ALT 27  ALKPHOS 50  BILITOT 0.9  PROT 7.0  ALBUMIN 4.2   Cardiac Enzymes  Recent Labs  07/30/15 1830 07/31/15 0002 07/31/15 0535  TROPONINI 48.64* >65.00* >65.00*   Hemoglobin A1C  Recent Labs  07/30/15 1830  HGBA1C 5.4   Fasting Lipid Panel  Recent Labs  07/31/15 0002  CHOL 118  HDL 28*  LDLCALC 44  TRIG 230*  CHOLHDL 4.2   Thyroid Function Tests  Recent Labs  07/30/15 1830  TSH 0.594    Portable Chest X-ray 1 View  07/30/2015  CLINICAL DATA:  Chest pain at rest, history coronary artery disease post stent placement EXAM: PORTABLE CHEST 1 VIEW COMPARISON:  Portable exam 1828 hours compared to 01/02/2007 FINDINGS: Normal heart size, mediastinal contours, and pulmonary vascularity. Lungs clear. No pleural effusion or pneumothorax. Calcified granuloma LEFT upper lobe versus bone island at anterior LEFT first rib. No acute osseous findings. Prior K-wire fixation of RIGHT clavicle. RIGHT glenohumeral  degenerative changes and chronic rotator cuff tear. IMPRESSION: No acute abnormalities. Electronically Signed   By: Lavonia Dana M.D.   On: 07/30/2015 18:50    Disposition   Pt is being discharged home today in good condition.  Follow-up Plans & Appointments    Follow-up Information    Follow up with [name], NP On 08/11/2015.   Specialties:  Nurse Practitioner, Interventional Cardiology, Cardiology, Radiology   Why:  at 8:30 am for follow up   Contact information:   [place] [place] [address] [phone]      Discharge Instructions    Amb Referral to Cardiac Rehabilitation    Complete by:  As directed   To [place] [facility]  Diagnosis:   Coronary Stents STEMI PTCA       Diet - low sodium heart healthy    Complete by:  As directed      Discharge instructions    Complete by:  As directed   NO HEAVY LIFTING OR SEXUAL ACTIVITY for 7 DAYS. NO DRIVING for 3-5 DAYS. NO SOAKING BATHS, HOT TUBS, POOLS, ETC., for 7 DAYS   Radial Site Care Refer to this sheet in the next few weeks. These instructions provide you with information on caring for yourself after your procedure. Your caregiver may also give you more specific instructions. Your treatment has been planned according to current medical practices, but problems sometimes occur. Call your caregiver if you have any problems or questions after your procedure. HOME CARE INSTRUCTIONS You may shower the day after the procedure.Remove the bandage (dressing) and gently wash the site with plain soap and water.Gently pat the site dry.  Do not apply powder or lotion to the site.  Do not submerge the affected site in water for 3 to 5 days.  Inspect the site at least twice daily.  Do not flex or bend the affected arm for 24 hours.  No lifting over 5 pounds (2.3 kg) for 5 days after your procedure.  Do not drive home if you are discharged the same day of the procedure. Have someone else drive you.  You may  drive 24 hours after the procedure unless otherwise instructed by your caregiver.  What to expect: Any bruising will usually fade within 1 to 2 weeks.  Blood that collects in the tissue (hematoma) may be painful to the touch. It should usually decrease in size and tenderness within 1 to 2 weeks.  SEEK IMMEDIATE MEDICAL CARE IF: You have unusual pain at the radial site.  You have redness, warmth, swelling, or pain at the radial site.  You have drainage (other than a small amount of blood on the dressing).  You have chills.  You have a fever or persistent symptoms for more than 72 hours.  You have a fever and your symptoms suddenly get worse.  Your arm becomes pale, cool, tingly, or numb.  You have heavy bleeding from the site. Hold pressure on the site.     Ok to go on beach trip, stay hydrated!     Increase activity slowly    Complete by:  As directed            Discharge Medications   Current Discharge Medication List    START taking these medications   Details  atorvastatin (LIPITOR) 80 MG tablet Take 1 tablet (80 mg total) by mouth daily at 6 PM. Qty: 30 tablet, Refills: 12    benazepril (LOTENSIN) 10 MG tablet Take 1 tablet (10 mg total) by mouth daily. Qty: 30 tablet, Refills: 12    metoprolol tartrate (LOPRESSOR) 25 MG tablet Take 0.5 tablets (12.5 mg total) by mouth 2 (two) times daily. Qty: 30 tablet, Refills: 12    nitroGLYCERIN (NITROSTAT) 0.4 MG SL tablet Place 1 tablet (0.4 mg total) under the tongue every 5 (five) minutes x 3 doses as needed for chest pain. Qty: 25 tablet, Refills: 1    !! ticagrelor (BRILINTA) 90 MG TABS tablet Take 1 tablet (90 mg total) by mouth 2 (two) times daily. Qty: 60 tablet, Refills: 12    !! ticagrelor (BRILINTA) 90 MG TABS tablet Take 1 tablet (90 mg total) by mouth 2 (two) times daily. Qty: 60 tablet, Refills: 0     !! - Potential duplicate medications found. Please discuss with provider.    CONTINUE these medications which  have NOT CHANGED  Details  B Complex-C (B-COMPLEX WITH VITAMIN C) tablet Take 1 tablet by mouth daily.    fish oil-omega-3 fatty acids 1000 MG capsule Take 2 g by mouth every other day.     fluticasone (FLONASE) 50 MCG/ACT nasal spray Place 1-2 sprays into both nostrils daily as needed. Allergies Refills: 0    ibuprofen (ADVIL,MOTRIN) 200 MG tablet Take 600 mg by mouth every 6 (six) hours as needed for mild pain.    Multiple Vitamin (MULTIVITAMIN) tablet Take 1 tablet by mouth daily.      STOP taking these medications     aspirin 325 MG tablet      diclofenac (VOLTAREN) 75 MG EC tablet      lisinopril (PRINIVIL,ZESTRIL) 20 MG tablet      rosuvastatin (CRESTOR) 10 MG tablet          Aspirin prescribed at discharge? Yes High Intensity Statin Prescribed? Yes Beta Blocker Prescribed?Yes "} For EF 45% or less, Was ACEI/ARB Prescribed? EF is 55-60% ADP Receptor Inhibitor Prescribed?Yes For EF <40%, Aldosterone Inhibitor Prescribed? Normal EF Was EF assessed during THIS hospitalization? Yes Was Cardiac Rehab II ordered? (Included Medically managed Patients): Yes   Outstanding Labs/Studies  BMP  Duration of Discharge Encounter   Greater than 30 minutes including physician time.  Signed, Arbutus Leas NP 08/01/2015, 10:48 AM

## 2015-08-01 NOTE — Care Management Note (Signed)
Case Management Note  Patient Details  Name: Mike Ray MRN: [id number] Date of Birth: 1950-01-30  Subjective/Objective:     Acute MI, DES x 1 to distal RCA. started on Brilinta               Action/Plan: Discharge Planning:   PCP - new PCP, does not recall name  NCM spoke to pt and wife at bedside. Pt states he does have health insurance Medicare A/B. [employer] RX plan. Provided cards. NCM faxed to admission office. [facility] has Brilinta in stock. Pt has 30 day free trial card. Pt thinks he does have supplemental insurance with [employer] and wife will fax info to admission office on Monday.    Expected Discharge Date:  08/01/15               Expected Discharge Plan:  Home/Self Care  In-House Referral:  NA  Discharge planning Services  CM Consult, Medication Assistance  Post Acute Care Choice:  NA Choice offered to:  NA  DME Arranged:  N/A DME Agency:  NA  HH Arranged:  NA HH Agency:  NA  Status of Service:  Completed, signed off  If discussed at [place] of Stay Meetings, dates discussed:    Additional Comments:  Erenest Rasher, RN 08/01/2015, 10:09 AM

## 2015-08-01 NOTE — Progress Notes (Signed)
Patient Name: Mike Ray Date of Encounter: 08/01/2015  Active Problems:   Acute MI, inferolateral wall, initial episode of care [facility])   Primary Cardiologist: New - Dr. Burt Knack Patient Profile: Mike Ray is a 65 year old male with a past medical history of HTN. He presented to the ED on 07/30/15 with complaints of chest pain for 48 hours. He had inferolateral ST elevation and code STEMI was called. He was brought urgently to the cath lab, DES x 1 to distal RCA.   SUBJECTIVE: Feels great, denies chest pain and SOB.    OBJECTIVE Filed Vitals:   07/31/15 1355 07/31/15 1630 07/31/15 1914 08/01/15 0513  BP: 140/90 122/88 121/80 112/69  Pulse:  62 77 79  Temp:  97.6 F (36.4 C) 97.9 F (36.6 C) 98 F (36.7 C)  TempSrc:  Oral Oral Oral  Resp: 16 18 18 18   Height:      Weight:    219 lb 4.8 oz (99.474 kg)  SpO2:  100% 98% 97%    Intake/Output Summary (Last 24 hours) at 08/01/15 0813 Last data filed at 08/01/15 0514  Gross per 24 hour  Intake    963 ml  Output    300 ml  Net    663 ml   Filed Weights   07/30/15 1700 08/01/15 0513  Weight: 220 lb 7.4 oz (100 kg) 219 lb 4.8 oz (99.474 kg)    PHYSICAL EXAM General: Well developed, well nourished, male in no acute distress. Head: Normocephalic, atraumatic.  Neck: Supple without bruits, No JVD. Lungs:  Resp regular and unlabored, CTA. Heart: RRR, S1, S2, no S3, S4, or murmur; no rub. Abdomen: Soft, non-tender, non-distended, BS + x 4.  Extremities: No clubbing, cyanosis,No  edema.  Neuro: Alert and oriented X 3. Moves all extremities spontaneously. Psych: Normal affect.  LABS: CBC: Recent Labs  07/30/15 1546  07/31/15 0535 08/01/15 0405  WBC 10.6*  < > 9.4 7.4  NEUTROABS 8.9*  --   --   --   HGB 15.1  < > 14.2 13.5  HCT 44.3  < > 42.6 40.4  MCV 95.1  < > 96.2 95.7  PLT 272  < > 222 198  < > = values in this interval not displayed. INR: Recent Labs  07/30/15 1546  INR 0000000   Basic Metabolic  Panel: Recent Labs  07/31/15 0535 08/01/15 0405  NA 137 138  K 4.1 4.0  CL 109 109  CO2 21* 24  GLUCOSE 124* 102*  BUN 13 10  CREATININE 0.78 0.82  CALCIUM 8.7* 8.6*   Liver Function Tests: Recent Labs  07/30/15 1546  AST 29  ALT 27  ALKPHOS 50  BILITOT 0.9  PROT 7.0  ALBUMIN 4.2   Cardiac Enzymes: Recent Labs  07/30/15 1830 07/31/15 0002 07/31/15 0535  TROPONINI 48.64* >65.00* >65.00*   BNP:  B NATRIURETIC PEPTIDE  Date/Time Value Ref Range Status  07/30/2015 06:30 PM 38.9 0.0 - 100.0 pg/mL Final   Hemoglobin A1C: Recent Labs  07/30/15 1830  HGBA1C 5.4   Fasting Lipid Panel: Recent Labs  07/31/15 0002  CHOL 118  HDL 28*  LDLCALC 44  TRIG 230*  CHOLHDL 4.2   Thyroid Function Tests: Recent Labs  07/30/15 1830  TSH 0.594     Current facility-administered medications:  .  0.9 %  sodium chloride infusion, 250 mL, Intravenous, PRN, Sherren Mocha, MD, Stopped at 07/31/15 0800 .  acetaminophen (TYLENOL) tablet 650 mg, 650  mg, Oral, Q4H PRN, Sherren Mocha, MD .  alum & mag hydroxide-simeth (MAALOX/MYLANTA) 200-200-20 MG/5ML suspension 30 mL, 30 mL, Oral, Q4H PRN, Sherren Mocha, MD, 30 mL at 07/31/15 0429 .  aspirin chewable tablet 81 mg, 81 mg, Oral, Daily, Sherren Mocha, MD, 81 mg at 07/31/15 0905 .  atorvastatin (LIPITOR) tablet 80 mg, 80 mg, Oral, q1800, Sherren Mocha, MD, 80 mg at 07/31/15 1717 .  diazepam (VALIUM) tablet 5 mg, 5 mg, Oral, Q8H PRN, Sherren Mocha, MD .  lisinopril (PRINIVIL,ZESTRIL) tablet 10 mg, 10 mg, Oral, Daily, Sherren Mocha, MD, 10 mg at 07/31/15 0905 .  metoprolol tartrate (LOPRESSOR) tablet 12.5 mg, 12.5 mg, Oral, BID, Sherren Mocha, MD, 12.5 mg at 07/31/15 2148 .  nitroGLYCERIN (NITROSTAT) SL tablet 0.4 mg, 0.4 mg, Sublingual, Q5 Min x 3 PRN, Sherren Mocha, MD .  ondansetron [facility]) injection 4 mg, 4 mg, Intravenous, Q6H PRN, Sherren Mocha, MD .  oxyCODONE-acetaminophen (PERCOCET/ROXICET) 5-325 MG per tablet 1-2  tablet, 1-2 tablet, Oral, Q4H PRN, Sherren Mocha, MD .  sodium chloride flush (NS) 0.9 % injection 3 mL, 3 mL, Intravenous, Q12H, Sherren Mocha, MD, 3 mL at 07/31/15 2200 .  sodium chloride flush (NS) 0.9 % injection 3 mL, 3 mL, Intravenous, PRN, Sherren Mocha, MD .  ticagrelor [facility]) tablet 90 mg, 90 mg, Oral, BID, Sherren Mocha, MD, 90 mg at 07/31/15 2148  Facility-Administered Medications Ordered in Other Encounters:  .  0.9 %  sodium chloride infusion, , , Continuous PRN, Sherren Mocha, MD, Last Rate: 500 mL/hr at 07/30/15 1619, 500 mL/hr at 07/30/15 1619 .  atropine 1 MG/10ML injection, , , PRN, Sherren Mocha, MD, 0.5 mg at 07/30/15 1553 .  fentaNYL (SUBLIMAZE) injection, , , PRN, Sherren Mocha, MD, 50 mcg at 07/30/15 1536 .  heparin infusion 2 units/mL in 0.9 % sodium chloride, , , Continuous PRN, Sherren Mocha, MD, 1,000 mL at 07/30/15 1557 .  heparin injection, , , PRN, Sherren Mocha, MD, 2,000 Units at 07/30/15 1601 .  iopamidol (ISOVUE-370) 76 % injection, , , PRN, Sherren Mocha, MD, 125 mL at 07/30/15 1622 .  midazolam (VERSED) injection, , , PRN, Sherren Mocha, MD, 2 mg at 07/30/15 1535 .  Radial Cocktail/Verapamil only, , , PRN, Sherren Mocha, MD, 10 mL at 07/30/15 1543 .  ticagrelor (BRILINTA) tablet, , , PRN, Sherren Mocha, MD, 180 mg at 07/30/15 1625 .  tirofiban (AGGRASTAT) bolus via infusion, , , PRN, Sherren Mocha, MD, 2,517.5 mcg at 07/30/15 1546 .  tirofiban (AGGRASTAT) infusion 50 mcg/mL 100 mL, , , Continuous PRN, Sherren Mocha, MD, Last Rate: 18.1 mL/hr at 07/30/15 1551, 0.15 mcg/kg/min at 07/30/15 1551    TELE: NSR    ECG: NSR with T wave inversion in inferior leads.   Radiology/Studies: Portable Chest X-ray 1 View  07/30/2015  CLINICAL DATA:  Chest pain at rest, history coronary artery disease post stent placement EXAM: PORTABLE CHEST 1 VIEW COMPARISON:  Portable exam 1828 hours compared to 01/02/2007 FINDINGS: Normal heart size, mediastinal  contours, and pulmonary vascularity. Lungs clear. No pleural effusion or pneumothorax. Calcified granuloma LEFT upper lobe versus bone island at anterior LEFT first rib. No acute osseous findings. Prior K-wire fixation of RIGHT clavicle. RIGHT glenohumeral degenerative changes and chronic rotator cuff tear. IMPRESSION: No acute abnormalities. Electronically Signed   By: Lavonia Dana M.D.   On: 07/30/2015 18:50     Current Medications:  . aspirin  81 mg Oral Daily  . atorvastatin  80 mg Oral q1800  .  lisinopril  10 mg Oral Daily  . metoprolol tartrate  12.5 mg Oral BID  . sodium chloride flush  3 mL Intravenous Q12H  . ticagrelor  90 mg Oral BID      ASSESSMENT AND PLAN: Active Problems:   Acute MI, inferolateral wall, initial episode of care ([place])  1. CAD/Acute inferolateral STEMI: Pt admitted 07/30/15 with acute inferolateral STEMI secondary to occluded distal RCA. Troponin over 65. DES placed x 1 in the distal RCA. Minimal disease in the LAD and Circumflex. He is stable this am. Will continue DAPT with ASA and Brilinta for at least one year. Will continue high intensity statin, beta blocker and Ace-inh.  EF is 0000000, normal diastolic function. Will get TOC appointment in 7-10 days.   2. HTN: Well controlled on beta blocker and ACE, continue same.   3. HLD: Continue high intensity statin.     Signed, Arbutus Leas , NP 8:13 AM 08/01/2015 Pager [phone]   I have personally seen and examined this patient with Jettie Booze, NP. I agree with the assessment and plan as outlined above. He was admitted with an interolateral STEMI. DES x 1 distal RCA. LVEF preserved. He is on good medical therapy. Walking the hallways this am. BP controlled. Exam with RRR, clear lungs, no LE edema. Discharge home today. Follow up Dr. Burt Knack or office APP in 1-2 weeks.   Lauree Chandler 08/01/2015 9:00 AM

## 2015-08-04 NOTE — Telephone Encounter (Signed)
PER PT ,  DOING  WELL SINCE  DISCHARGE,  NO QUESTIONS IS  AWARE  OF F/U APPT  WITH   LORI GERHARDT .Mike Ray

## 2015-08-11 ENCOUNTER — Encounter: Payer: Self-pay | Admitting: Nurse Practitioner

## 2015-08-11 ENCOUNTER — Ambulatory Visit (INDEPENDENT_AMBULATORY_CARE_PROVIDER_SITE_OTHER): Payer: Medicare Other | Admitting: Nurse Practitioner

## 2015-08-11 VITALS — BP 160/90 | HR 60 | Ht 71.0 in | Wt 222.0 lb

## 2015-08-11 DIAGNOSIS — I10 Essential (primary) hypertension: Secondary | ICD-10-CM

## 2015-08-11 DIAGNOSIS — Z955 Presence of coronary angioplasty implant and graft: Secondary | ICD-10-CM

## 2015-08-11 DIAGNOSIS — I2111 ST elevation (STEMI) myocardial infarction involving right coronary artery: Secondary | ICD-10-CM

## 2015-08-11 DIAGNOSIS — E785 Hyperlipidemia, unspecified: Secondary | ICD-10-CM

## 2015-08-11 DIAGNOSIS — I259 Chronic ischemic heart disease, unspecified: Secondary | ICD-10-CM

## 2015-08-11 LAB — CBC
HCT: 45 % (ref 38.5–50.0)
Hemoglobin: 15.6 g/dL (ref 13.2–17.1)
MCH: 32.4 pg (ref 27.0–33.0)
MCHC: 34.7 g/dL (ref 32.0–36.0)
MCV: 93.4 fL (ref 80.0–100.0)
MPV: 10 fL (ref 7.5–12.5)
Platelets: 279 10*3/uL (ref 140–400)
RBC: 4.82 MIL/uL (ref 4.20–5.80)
RDW: 13.1 % (ref 11.0–15.0)
WBC: 5.9 10*3/uL (ref 3.8–10.8)

## 2015-08-11 LAB — BASIC METABOLIC PANEL
BUN: 15 mg/dL (ref 7–25)
CO2: 23 mmol/L (ref 20–31)
Calcium: 9.3 mg/dL (ref 8.6–10.3)
Chloride: 106 mmol/L (ref 98–110)
Creat: 0.97 mg/dL (ref 0.70–1.25)
Glucose, Bld: 109 mg/dL — ABNORMAL HIGH (ref 65–99)
Potassium: 4.1 mmol/L (ref 3.5–5.3)
Sodium: 139 mmol/L (ref 135–146)

## 2015-08-11 MED ORDER — LOSARTAN POTASSIUM 50 MG PO TABS: 50.0000 mg | ORAL_TABLET | Freq: Every day | ORAL | Status: DC

## 2015-08-11 NOTE — Patient Instructions (Addendum)
We will be checking the following labs today - BMET and CBC   Medication Instructions:    Continue with your current medicines.   STOP both the Lisinopril and the Lotensin  START Losartan 50 mg a day - this is at the drug store - this will not make you cough  Samples of Brilinta if possible    Testing/Procedures To Be Arranged:  N/A  Follow-Up:   See me or Dr. Burt Knack in 4 to 6 weeks with fasting labs    Other Special Instructions:   Look at your drug plan and see what is covered - it will either be Plavix or Effient - let us know  I will send a note to cardiac rehab.  I want you to keep a check on your BP for [place] - goal is less than 135/85 or less  Limit your time out in the heat    If you need a refill on your cardiac medications before your next appointment, please call your pharmacy.   Call the [place] office at [phone] if you have any questions, problems or concerns.

## 2015-08-11 NOTE — Progress Notes (Signed)
CARDIOLOGY OFFICE NOTE  Date:  08/11/2015    Mike Ray Date of Birth: April 21, 1950 Medical Record [id number]  PCP:  Binnie Rail, MD  Cardiologist:   Jerel Shepherd  Chief Complaint  Patient presents with  . Coronary Artery Disease    TOC post MI - seen for Dr. Burt Knack    History of Present Illness: Mike Ray is a 65 y.o. male who presents today for a TOC/post hospital visit. Seen for Dr. Burt Knack.  He has a history of HTN. No prior history of CAD, but has a strong family history of CAD. He presented to the ED on 07/30/15 with chest pain. 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 He had associated diaphoresis. He took a cold shower but his symptoms persisted, and called EMS. Upon arrival of EMS there was inferolateral ST elevation and a code STEMI was called.  He was taken urgently to the cath lab. He received drug-eluting stent to his RCA. There is some residual mild nonobstructive disease in his LAD. Cath report below.    His troponin was elevated at 48.64 and peaked at greater than 65. Echocardiogram shows preserved EF of 55-60%. Left ventricular diastolic function was normal.   He will be on dual antiplatelet therapy with aspirin and present for at least one year. He was started on metoprolol. He takes an ACE inhibitor already at home, this was continued at discharge. Note that at home he was on lisinopril at home, however at discharge his insurance would no longer cover lisinopril so he was changed to benazepril. We will continue benazepril at 10 mg. His Voltaren was discontinued at discharge as he was started on dual antiplatelet therapy. He was also started on high intensity statin.     Comes back today. Here alone. No more chest pain. Not short of breath. He is taking BOTH Lisinopril and Lotensin - says he was told to do this. He has  had a dry cough on Lisinopril in the past. Little dizzy with position changes. No more chest pain. Not short of breath. Likes to swim due to hip issues. BP is much better at home. He would like to return to work next week - he understands about limiting his time in the heat.  Brilinta was very expensive for him - he has Part D coverage.   Past Medical History  Diagnosis Date  . Hyperlipidemia     NMR 06/2009: LDL [id number]), HDL 40, TG 127. Framingham Study LDL goal =< 130   . Gilbert's syndrome   . Elevated PSA     Dr Barnie Del  . History of nephrolithiasis   . History of heat stroke 2009  . Osteoarthritis of left hip   . Hypertension   . Acute MI, inferolateral wall, initial episode of care ([place]) 07/30/2015  . Coronary artery disease     Past Surgical History  Procedure Laterality Date  . Lithotripsy  2009    Dr.Duckett, [place]   . Total hip arthroplasty  2008    left  . Total hip arthroplasty  2004    right  . Colonoscopy  2010    negative  . Cardiac catheterization    . Cardiac catheterization N/A 07/30/2015    Procedure: Left Heart Cath and Coronary Angiography;  Surgeon: Sherren Mocha, MD;  Location: [facility];  Service: Cardiovascular;  Laterality: N/A;  . Cardiac catheterization N/A 07/30/2015    Procedure: Coronary Stent Intervention;  Surgeon: Sherren Mocha, MD;  Location: [facility];  Service: Cardiovascular;  Laterality: N/A;  Distal RCA- Promus 3.50x16     Medications: Current Outpatient Prescriptions  Medication Sig Dispense Refill  . aspirin 81 MG tablet Take 81 mg by mouth daily.    Marland Kitchen atorvastatin (LIPITOR) 80 MG tablet Take 1 tablet (80 mg total) by mouth daily at 6 PM. 30 tablet 12  . B Complex-C (B-COMPLEX WITH VITAMIN C) tablet Take 1 tablet by mouth daily.    . fish oil-omega-3 fatty acids 1000 MG capsule Take 1 g by mouth every other day.     . fluticasone (FLONASE) 50 MCG/ACT nasal spray Place 1-2 sprays into both nostrils daily as  needed. Allergies  0  . metoprolol tartrate (LOPRESSOR) 25 MG tablet Take 0.5 tablets (12.5 mg total) by mouth 2 (two) times daily. 30 tablet 12  . Multiple Vitamin (MULTIVITAMIN) tablet Take 1 tablet by mouth daily.    . nitroGLYCERIN (NITROSTAT) 0.4 MG SL tablet Place 1 tablet (0.4 mg total) under the tongue every 5 (five) minutes x 3 doses as needed for chest pain. 25 tablet 1  . ticagrelor (BRILINTA) 90 MG TABS tablet Take 1 tablet (90 mg total) by mouth 2 (two) times daily. 60 tablet 12  . losartan (COZAAR) 50 MG tablet Take 1 tablet (50 mg total) by mouth daily. 30 tablet 6   No current facility-administered medications for this visit.    Allergies: Allergies  Allergen Reactions  . Naproxen Sodium     REACTION: abdominal cramping    Social History: The patient  reports that he quit smoking about 15 years ago. He does not have any smokeless tobacco history on file. He reports that he drinks about 9.6 oz of alcohol per week. He reports that he does not use illicit drugs.   Family History: The patient's family history includes Coronary artery disease in his brother; Diabetes in his mother; Heart attack (age of onset: 4) in his father; Heart attack (age of onset: 66) in his brother; Hypertension in his brother and mother; Prostate cancer (age of onset: 1) in his brother. There is no history of Stroke.   Review of Systems: Please see the history of present illness.   Otherwise, the review of systems is positive for none.   All other systems are reviewed and negative.   Physical Exam: VS:  BP 160/90 mmHg  Pulse 60  Ht 5\' 11"  (1.803 m)  Wt 222 lb (100.699 kg)  BMI 30.98 kg/m2 .  BMI Body mass index is 30.98 kg/(m^2).  Wt Readings from Last 3 Encounters:  08/11/15 222 lb (100.699 kg)  08/01/15 219 lb 4.8 oz (99.474 kg)  07/02/14 222 lb (100.699 kg)   Repeat BP by me is 130/80.  General: Pleasant. Well developed, well nourished and in no acute distress.  HEENT: Normal. Neck:  Supple, no JVD, carotid bruits, or masses noted.  Cardiac: Regular rate and rhythm. No murmurs, rubs, or gallops. No edema.  Respiratory:  Lungs are clear to auscultation bilaterally with normal work of breathing.  GI: Soft and nontender.  MS: No deformity or atrophy. Gait and ROM intact. Skin: Warm and dry. Color is normal.  Neuro:  Strength and sensation are intact and no gross focal deficits noted.  Psych: Alert, appropriate and with normal affect. Right wrist looks fine.   LABORATORY DATA:  EKG:  EKG is not ordered today.  Lab Results  Component Value Date   WBC 7.4 08/01/2015   HGB 13.5 08/01/2015   HCT 40.4 08/01/2015   PLT 198 08/01/2015   GLUCOSE 102* 08/01/2015   CHOL 118 07/31/2015   TRIG 230* 07/31/2015   HDL 28* 07/31/2015   LDLDIRECT 117.0 07/10/2014   LDLCALC 44 07/31/2015   ALT 27 07/30/2015   AST 29 07/30/2015   NA 138 08/01/2015   K 4.0 08/01/2015   CL 109 08/01/2015   CREATININE 0.82 08/01/2015   BUN 10 08/01/2015   CO2 24 08/01/2015   TSH 0.594 07/30/2015   PSA 2.36 07/10/2014   INR 1.20 07/30/2015   HGBA1C 5.4 07/30/2015    BNP (last 3 results)  Recent Labs  07/30/15 1830  BNP 38.9    ProBNP (last 3 results) No results for input(s): PROBNP in the last 8760 hours.   Other Studies Reviewed Today:  Coronary Stent Intervention Left Heart Cath and Coronary Angiography 07/30/15   Prox Cx to Mid Cx lesion, 25% stenosed.  Mid LAD lesion, 30% stenosed.  1st Diag lesion, 35% stenosed.  There is mild left ventricular systolic dysfunction.  Dist RCA lesion, 100% stenosed. Post intervention, there is a 0% residual stenosis.  1. Acute inferolateral STEMI secondary to total occlusion of the distal RCA, treated successfully with primary PCI using a drug-eluting stent  2. Mild nonobstructive stenosis of the LAD and left circumflex  3. Mild segmental contraction abnormality left ventricle with well-preserved overall LVEF  Recommendations:  Post MI medical therapy. Aspirin and brilinta for at least 12 months. If hospital course is uncomplicated, patient could be transferred out of the ICU tomorrow and discharged home in 48 hours.   Transthoracic Echocardiography 07/31/15 Study Conclusions  - Left ventricle: The cavity size was normal. Systolic function was  normal. The estimated ejection fraction was in the range of 55%  to 60%. Wall motion was normal; there were no regional wall  motion abnormalities. Left ventricular diastolic function  parameters were normal. - Aortic valve: There was trivial regurgitation. - Left atrium: The atrium was mildly dilated. - Atrial septum: No defect or patent foramen ovale was identified  Assessment/Plan: 1. Acute inferolateral STEMI secondary to total occlusion of the distal RCA, treated successfully with primary PCI using a drug-eluting stent. He is doing well clinically. Will send to cardiac rehab. Ok to resume swimming. Discussed CV risk factor modification in depth today. Cost of Brilinta may be an issue - he is going to look at his drug plan and see what is preferred. Will try to give samples.   2. Mild nonobstructive stenosis of the LAD and left circumflex - to manage medically and with CV risk factor modification.  3. HLD - on high dose statin therapy - needs labs on next visit.   4. History of alcohol use  5. HTN - recheck by me is fine. He is ACE ALLERGIC. Not sure why he is taking two ACE's. Stopped both and changed to Losartan 50 mg a day - he will continue to monitor his BP at home.   Current medicines are reviewed with the patient today.  The patient does not have concerns regarding medicines other than what has been noted above.  The following changes have been made:  See above.  Labs/ tests ordered today include:    Orders Placed This Encounter  Procedures  . Basic metabolic panel  . CBC     Disposition:  FU with  Patient is agreeable to this plan and will  call if any problems develop in the interim.   Signed: Burtis Junes, RN, ANP-C 08/11/2015 9:00 AM  [facility] [address] [place], [place]  [address] Phone: [phone] Fax: [phone]

## 2015-08-12 ENCOUNTER — Telehealth: Payer: Self-pay | Admitting: Internal Medicine

## 2015-08-12 ENCOUNTER — Telehealth: Payer: Self-pay | Admitting: *Deleted

## 2015-08-12 NOTE — Telephone Encounter (Signed)
I think Cardio needs to do the referral - why can't they redo it/change location?

## 2015-08-12 NOTE — Telephone Encounter (Signed)
Per pt he would like to do cardiac rehab in [place].  Spoke w/ Dr. Quay Burow office and they will set up referral.

## 2015-08-12 NOTE — Telephone Encounter (Signed)
Pt seen a cardiologist yesterday and he is needing cardiac rehab. He is requesting to have this done in [facility]. The heart center can't redo the referral. Are you able to put a new referral for this. He was thinking about the hospital but someone closer to where he lives would be ideal. You see him next week for a transfer appt from [place]

## 2015-08-13 ENCOUNTER — Telehealth: Payer: Self-pay | Admitting: Nurse Practitioner

## 2015-08-13 NOTE — Telephone Encounter (Signed)
Lori from Dr. Quay Burow office is calling about patient getting set up w/cardiac rehab . Please call   Thanks

## 2015-08-13 NOTE — Telephone Encounter (Signed)
Dr. Quay Burow felt Cardiology should handle this. I will contact [facility] and get them to send [place] their form.

## 2015-08-13 NOTE — Telephone Encounter (Signed)
Spoke with representative from [place] with [facility] and she states she just needs referral and copy of EKG faxed over. They are able to see records in [facility] so no need to send those. Filled out referral form and placed in Dr. Antionette Char box for him to sign.

## 2015-08-13 NOTE — Telephone Encounter (Signed)
Talked with Billie at [facility] and she states they are taking care of this

## 2015-08-13 NOTE — Telephone Encounter (Signed)
Spoke with Cecille Rubin at Dr. Quay Burow and informed her that Rodena Piety, RN had spoken with pt yesterday and clarified that he wanted to go to [facility] for cardiac rehab. Advised that our office will handle referral to cardiac rehab in [place].

## 2015-08-13 NOTE — Telephone Encounter (Signed)
I'm a little confused by the message.  Was Dr. Quay Burow going to refer him to cardiac rehab in [place]?  If now, we need to get a referral form and let his primary cardiologist sign.

## 2015-08-15 NOTE — Telephone Encounter (Signed)
Order and EKG faxed to [phone].

## 2015-08-18 ENCOUNTER — Encounter: Payer: Self-pay | Admitting: Internal Medicine

## 2015-08-18 ENCOUNTER — Ambulatory Visit (INDEPENDENT_AMBULATORY_CARE_PROVIDER_SITE_OTHER): Payer: Medicare Other | Admitting: Internal Medicine

## 2015-08-18 VITALS — BP 130/86 | HR 76 | Temp 97.6°F | Resp 16 | Ht 71.0 in | Wt 219.0 lb

## 2015-08-18 DIAGNOSIS — I2119 ST elevation (STEMI) myocardial infarction involving other coronary artery of inferior wall: Secondary | ICD-10-CM | POA: Diagnosis not present

## 2015-08-18 DIAGNOSIS — E782 Mixed hyperlipidemia: Secondary | ICD-10-CM

## 2015-08-18 DIAGNOSIS — I259 Chronic ischemic heart disease, unspecified: Secondary | ICD-10-CM

## 2015-08-18 DIAGNOSIS — I10 Essential (primary) hypertension: Secondary | ICD-10-CM | POA: Diagnosis not present

## 2015-08-18 NOTE — Assessment & Plan Note (Signed)
Taking lipitor daily Exercising Eating healthy Continue same Will recheck lipid panel at physical

## 2015-08-18 NOTE — Progress Notes (Signed)
Subjective:    Patient ID: Mike Ray, male    DOB: 1950-07-07, 65 y.o.   MRN: [id number]  HPI He is here to establish with a new pcp.     CAD:  He went to the ED 07/27/15 with chest pain.  He did not have a history of personal CAD prior to this, just a family history.  He was doing very physical work and at the end of the day had pressure in his teeth, jaw and chest with associated diaphoresis.  He went to the ED because his symptoms persisted.  He had inferolateral ST elevation on his EKG.  He was taken urgently to the cath lab and had a DES to his RCA.  An Echo showed EF of 0000000 and LV diastolic function was normal.  He has already seen cardiology.  He is taking all of his medications as prescribed.    Hypertension: He is taking his medication daily. He is compliant with a low sodium diet.  He denies chest pain, palpitations, edema, shortness of breath and regular headaches. 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  He does monitor his blood pressure at home - it is controlled - < 140/92.    Hyperlipidemia: He is taking his medication daily. He is compliant with a low fat/cholesterol diet. He is exercising regularly. He denies myalgias.    Medications and allergies reviewed with patient and updated if appropriate.  Patient Active Problem List   Diagnosis Date Noted  . Acute MI, inferolateral wall, initial episode of care ([place]) 07/30/2015  . NEPHROLITHIASIS, HX OF 10/22/2008  . CHOLELITHIASIS 11/08/2007  . JAUNDICE 10/30/2007  . Essential hypertension 10/17/2007  . HYPERLIPIDEMIA 12/13/2006  . ELEVATED PROSTATE SPECIFIC ANTIGEN 09/16/2006    Current Outpatient Prescriptions on File Prior to Visit  Medication Sig Dispense Refill  . aspirin 81 MG tablet Take 81 mg by mouth daily.    Marland Kitchen atorvastatin (LIPITOR) 80 MG tablet Take 1 tablet (80 mg total) by mouth daily at 6 PM. 30 tablet 12  . B Complex-C (B-COMPLEX WITH  VITAMIN C) tablet Take 1 tablet by mouth daily.    . fish oil-omega-3 fatty acids 1000 MG capsule Take 1 g by mouth every other day.     . fluticasone (FLONASE) 50 MCG/ACT nasal spray Place 1-2 sprays into both nostrils daily as needed. Allergies  0  . losartan (COZAAR) 50 MG tablet Take 1 tablet (50 mg total) by mouth daily. 30 tablet 6  . metoprolol tartrate (LOPRESSOR) 25 MG tablet Take 0.5 tablets (12.5 mg total) by mouth 2 (two) times daily. 30 tablet 12  . Multiple Vitamin (MULTIVITAMIN) tablet Take 1 tablet by mouth daily.    . nitroGLYCERIN (NITROSTAT) 0.4 MG SL tablet Place 1 tablet (0.4 mg total) under the tongue every 5 (five) minutes x 3 doses as needed for chest pain. 25 tablet 1  . ticagrelor (BRILINTA) 90 MG TABS tablet Take 1 tablet (90 mg total) by mouth 2 (two) times daily. 60 tablet 12   No current facility-administered medications on file prior to visit.     Past Medical History:  Diagnosis Date  . Acute MI, inferolateral wall, initial episode of care ([place]) 07/30/2015  . Coronary artery disease   . Elevated PSA    Dr Barnie Del  . Gilbert's syndrome   . History of heat stroke 2009  . History of nephrolithiasis   .  Hyperlipidemia    NMR 06/2009: LDL [id number]), HDL 40, TG 127. Framingham Study LDL goal =< 130   . Hypertension   . Osteoarthritis of left hip     Past Surgical History:  Procedure Laterality Date  . CARDIAC CATHETERIZATION    . CARDIAC CATHETERIZATION N/A 07/30/2015   Procedure: Left Heart Cath and Coronary Angiography;  Surgeon: Sherren Mocha, MD;  Location: [facility];  Service: Cardiovascular;  Laterality: N/A;  . CARDIAC CATHETERIZATION N/A 07/30/2015   Procedure: Coronary Stent Intervention;  Surgeon: Sherren Mocha, MD;  Location: [facility];  Service: Cardiovascular;  Laterality: N/A;  Distal RCA- Promus 3.50x16  . COLONOSCOPY  2010   negative  . LITHOTRIPSY  2009   Dr.Duckett, [place]   . TOTAL HIP ARTHROPLASTY  2008   left    . TOTAL HIP ARTHROPLASTY  2004   right    Social History   Social History  . Marital status: Divorced    Spouse name: N/A  . Number of children: N/A  . Years of education: N/A   Social History Main Topics  . Smoking status: Former Smoker    Quit date: 01/26/2000  . Smokeless tobacco: Not on file     Comment: Age 62  . Alcohol use 9.6 oz/week    16 Cans of beer per week  . Drug use: No  . Sexual activity: Yes    Birth control/ protection: Other-see comments   Other Topics Concern  . Not on file   Social History Narrative  . No narrative on file    Family History  Problem Relation Age of Onset  . Diabetes Mother   . Hypertension Mother   . Heart attack Father 90    smoker  . Prostate cancer Brother 3    CAD; stents @ 22  . Hypertension Brother   . Heart attack Brother 79  . Coronary artery disease Brother     stents late 40s  . Stroke Neg Hx     Review of Systems  Constitutional: Negative for fever.  Respiratory: Negative for cough, shortness of breath and wheezing.   Cardiovascular: Negative for chest pain, palpitations and leg swelling.  Gastrointestinal: Negative for abdominal pain and nausea.       Occasional GERD  Neurological: Positive for light-headedness (with changes in position). Negative for dizziness and headaches.       Objective:   Vitals:   08/18/15 1523  BP: 130/86  Pulse: 76  Resp: 16  Temp: 97.6 F (36.4 C)   Filed Weights   08/18/15 1523  Weight: 219 lb (99.3 kg)   Body mass index is 30.54 kg/m.   Physical Exam Constitutional: Appears well-developed and well-nourished. No distress.  Neck: Neck supple. No tracheal deviation present. No thyromegaly present.  No carotid bruit. No cervical adenopathy.   Cardiovascular: Normal rate, regular rhythm and normal heart sounds.   No murmur heard.  No edema Pulmonary/Chest: Effort normal and breath sounds normal. No respiratory distress. No wheezes.  Psych: normal mood and  affect      Assessment & Plan:   See Problem List for Assessment and Plan of chronic medical problems.

## 2015-08-18 NOTE — Assessment & Plan Note (Signed)
07/27/15 - STEMI, cath with DES to RCA Asymptomatic since Has seen cardio Echo 07/2015 EF 55-60%

## 2015-08-18 NOTE — Patient Instructions (Signed)
   All other Health Maintenance issues reviewed.   All recommended immunizations and age-appropriate screenings are up-to-date or discussed.  No immunizations administered today.   Medications reviewed and updated.  No changes recommended at this time.   Please followup in 4 months for a physical.

## 2015-08-18 NOTE — Progress Notes (Signed)
Pre visit review using our clinic review tool, if applicable. No additional management support is needed unless otherwise documented below in the visit note. 

## 2015-08-18 NOTE — Assessment & Plan Note (Signed)
BP well controlled Current regimen effective and well tolerated Continue current medications at current doses  

## 2015-08-21 ENCOUNTER — Telehealth: Payer: Self-pay | Admitting: Cardiovascular Disease

## 2015-08-21 NOTE — Telephone Encounter (Signed)
New message   [facility] rep verbalized that she is returning call to [place] about a referral that she sent for pt to start cardiac rehab

## 2015-08-22 NOTE — Telephone Encounter (Signed)
I spoke with [facility] and she contacted our office to obtain the pt's ICD 10 code for cardiac rehab referral. Code provided: I21.19

## 2015-08-25 ENCOUNTER — Telehealth: Payer: Self-pay | Admitting: Nurse Practitioner

## 2015-08-25 NOTE — Telephone Encounter (Signed)
Let me run this by Dr. Burt Knack. He may want P2Y12 testing if starting Plavix.

## 2015-08-25 NOTE — Telephone Encounter (Signed)
Mike Ray is calling to see if he can get a medication that is more affordable for him . He now takes Brilinta and that is expensive and with his insurance it is $332.00 for him out of pocket . He is looking at Clopidogrel which is available to him in 75 mg  Or 300mg   Thanks

## 2015-08-25 NOTE — Telephone Encounter (Signed)
Ok to transition to plavix. Should take plavix 300 mg for first dose, then 75 mg daily thereafter. Thanks.

## 2015-08-26 ENCOUNTER — Telehealth: Payer: Self-pay | Admitting: *Deleted

## 2015-08-26 MED ORDER — CLOPIDOGREL BISULFATE 75 MG PO TABS: 75.0000 mg | ORAL_TABLET | Freq: Every day | ORAL | 9 refills | Status: DC

## 2015-08-26 NOTE — Telephone Encounter (Signed)
Left message on machine for pt to contact the office.   Need to clarify instructions that were given to the pt in regards to starting plavix.

## 2015-08-26 NOTE — Telephone Encounter (Signed)
Follow-up  Pt c/o medication issue:  1. Name of Medication: clopidogrel   2. How are you currently taking this medication (dosage and times per day)? Twice per day  3. Are you having a reaction (difficulty breathing--STAT)? No  4. What is your medication issue? Patient wants to know if he needs to take it tonight since he's taken it today already. Pt verbalized he needs more clarity

## 2015-08-26 NOTE — Telephone Encounter (Signed)
I spoke with the pt and he plans on taking his normal dosage of Brilinta tonight and then start plavix tomorrow.  Tomorrow the pt will take Plavix 300mg  and then on Thursday the pt will begin Plavix 75mg  take one tablet by mouth daily.  I advised the pt about the change in instructions at the pharmacy.  The pt verbalized understanding of medication instructions.

## 2015-08-26 NOTE — Telephone Encounter (Signed)
Follow-up     Pt c/o medication issue:  1. Name of Medication: plavix  2. How are you currently taking this medication (dosage and times per day)? 75 mg   3. Are you having a reaction (difficulty breathing--STAT)? no  4. What is your medication issue? Need clarification on the direction of the medication

## 2015-08-26 NOTE — Telephone Encounter (Signed)
[facility] had also called to clarify the pt's prescription.  I spoke with Curt Bears in the pharmacy and changed the order to Plavix 75mg  take one tablet by mouth daily #90, RF 3.

## 2015-08-26 NOTE — Telephone Encounter (Signed)
S/w pt is agreeable too treatment plan.  Will start plavix tomorrow 4 tablets (300 mg ) than (75 mg ) daily.  Sent in to pt's requested pharmacy.

## 2015-09-01 ENCOUNTER — Ambulatory Visit (INDEPENDENT_AMBULATORY_CARE_PROVIDER_SITE_OTHER): Payer: Medicare Other | Admitting: Nurse Practitioner

## 2015-09-01 ENCOUNTER — Encounter: Payer: Self-pay | Admitting: Nurse Practitioner

## 2015-09-01 ENCOUNTER — Ambulatory Visit
Admission: RE | Admit: 2015-09-01 | Discharge: 2015-09-01 | Disposition: A | Discharge status: Home / Self Care | Payer: Medicare Other | Source: Ambulatory Visit | Attending: Nurse Practitioner | Admitting: Nurse Practitioner

## 2015-09-01 ENCOUNTER — Telehealth: Payer: Self-pay | Admitting: Nurse Practitioner

## 2015-09-01 VITALS — BP 136/92 | HR 73 | Ht 71.0 in | Wt 226.0 lb

## 2015-09-01 DIAGNOSIS — I10 Essential (primary) hypertension: Secondary | ICD-10-CM

## 2015-09-01 DIAGNOSIS — E785 Hyperlipidemia, unspecified: Secondary | ICD-10-CM

## 2015-09-01 DIAGNOSIS — R059 Cough, unspecified: Secondary | ICD-10-CM

## 2015-09-01 DIAGNOSIS — R05 Cough: Secondary | ICD-10-CM

## 2015-09-01 DIAGNOSIS — I259 Chronic ischemic heart disease, unspecified: Secondary | ICD-10-CM

## 2015-09-01 DIAGNOSIS — Z955 Presence of coronary angioplasty implant and graft: Secondary | ICD-10-CM | POA: Diagnosis not present

## 2015-09-01 DIAGNOSIS — R062 Wheezing: Secondary | ICD-10-CM

## 2015-09-01 MED ORDER — AZITHROMYCIN 250 MG PO TABS: ORAL_TABLET | ORAL | 0 refills | Status: DC

## 2015-09-01 MED ORDER — LOSARTAN POTASSIUM 100 MG PO TABS: 100.0000 mg | ORAL_TABLET | Freq: Every day | ORAL | 3 refills | Status: DC

## 2015-09-01 NOTE — Telephone Encounter (Signed)
S/w pt this am is still concerned about bp readings. Last 10 days of July pt's bp was 130-135/89-92.  Stated started getting concerned due to wheezing when laying down, deep cough with phlegm occasionally, and when pt coughs sides ache and some dizziness.  Change pts appointment to today.  Pt sated use to be on lisinopril took a (20 mg ) table this am due to concern of bp readings 157/100-147/96.  Stated to restart Lorsartan ( 50 mg ) tomorrow but will talk when pt comes in today for ov.

## 2015-09-01 NOTE — Progress Notes (Signed)
CARDIOLOGY OFFICE NOTE  Date:  09/01/2015    Mike Ray Date of Birth: [birth year] Medical Record [id number]  PCP:  Binnie Rail, MD  Cardiologist:  Jerel Shepherd    Chief Complaint  Patient presents with  . Coronary Artery Disease  . Hypertension  . Hyperlipidemia    Work in visit - seen for Dr. Burt Knack    History of Present Illness: Mike Ray is a 65 y.o. male who presents today for a work in visit/follow up. Seen for Dr. Burt Knack.  He has a history of HTN. No prior history of CAD, but has a strong family history of CAD. He presented to the ED on 07/30/15 with chest pain. 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 He had associated diaphoresis. He took a cold shower but his symptoms persisted, and called EMS. Upon arrival of EMS there was inferolateral ST elevation and a code STEMI was called.  He was taken urgently to the cath lab. He received drug-eluting stent to his RCA. There is some residual mild nonobstructive disease in his LAD. Cath report below.    His troponin was elevated at 48.64 and peaked at greater than 65. Echocardiogram shows preserved EF of 55-60%. Left ventricular diastolic function was normal.   He will be on dual antiplatelet therapy with aspirin and present for at least one year. He was started on metoprolol. He takes an ACE inhibitor already at home, this was continued at discharge. Note that at home he was on lisinopril at home, however at discharge his insurance would no longer cover lisinopril so he was changed to benazepril. We will continue benazepril at 10 mg. His Voltaren was discontinued at discharge as he was started on dual antiplatelet therapy. He was also started on high intensity statin.     I saw him back about 3 weeks ago - doing well but medicines were all mixed up. He was taking both Lisinopril  and Lotensin - has had reported dry cough in the past with Lisinopril. I stopped both of these and placed him on Losartan.   Several phone calls since his last visit with me - he has switched to generic Plavix due to cost issues and is not on Brilinta.   Called this morning and noted that his BP was up some - he took a dose of Lisinopril again - said he did not expect a phone call back. Was told to come back here today.   Comes back today. Here alone. Anxious. Seems like he is getting a URI. He says he has started cough - productive of brown/clear sputum. Hears himself wheezing. Got worried and called here. Was treated for URI back in June by PCP - this seems somewhat reminiscent. Did just come back from the beach - says he "celebrated a little too much". Had more alcohol. Probably too much salt. His cuff does not match up (higher) - about 30 points off systolic and 10 points off diastolic. No actual chest pain. He is not short of breath. He is now on Plavix.   Past Medical History:  Diagnosis Date  . Acute MI, inferolateral wall, initial episode of care ([place]) 07/30/2015  . Coronary artery disease   . Elevated PSA    Dr Barnie Del  . Gilbert's syndrome   . History of heat stroke 2009  .  History of nephrolithiasis   . Hyperlipidemia    NMR 06/2009: LDL [id number]), HDL 40, TG 127. Framingham Study LDL goal =< 130   . Hypertension   . Osteoarthritis of left hip     Past Surgical History:  Procedure Laterality Date  . CARDIAC CATHETERIZATION    . CARDIAC CATHETERIZATION N/A 07/30/2015   Procedure: Left Heart Cath and Coronary Angiography;  Surgeon: Sherren Mocha, MD;  Location: [facility];  Service: Cardiovascular;  Laterality: N/A;  . CARDIAC CATHETERIZATION N/A 07/30/2015   Procedure: Coronary Stent Intervention;  Surgeon: Sherren Mocha, MD;  Location: [facility];  Service: Cardiovascular;  Laterality: N/A;  Distal RCA- Promus 3.50x16  . COLONOSCOPY  2010   negative  .  LITHOTRIPSY  2009   Dr.Duckett, [place]   . TOTAL HIP ARTHROPLASTY  2008   left  . TOTAL HIP ARTHROPLASTY  2004   right     Medications: Current Outpatient Prescriptions  Medication Sig Dispense Refill  . aspirin 81 MG tablet Take 81 mg by mouth daily.    Marland Kitchen atorvastatin (LIPITOR) 80 MG tablet Take 1 tablet (80 mg total) by mouth daily at 6 PM. 30 tablet 12  . B Complex-C (B-COMPLEX WITH VITAMIN C) tablet Take 1 tablet by mouth daily.    . clopidogrel (PLAVIX) 75 MG tablet Take 75 mg by mouth daily.    . fish oil-omega-3 fatty acids 1000 MG capsule Take 1 g by mouth every other day.     . fluticasone (FLONASE) 50 MCG/ACT nasal spray Place 1-2 sprays into both nostrils daily as needed. Allergies  0  . metoprolol tartrate (LOPRESSOR) 25 MG tablet Take 0.5 tablets (12.5 mg total) by mouth 2 (two) times daily. 30 tablet 12  . Multiple Vitamin (MULTIVITAMIN) tablet Take 1 tablet by mouth daily.    . nitroGLYCERIN (NITROSTAT) 0.4 MG SL tablet Place 1 tablet (0.4 mg total) under the tongue every 5 (five) minutes x 3 doses as needed for chest pain. 25 tablet 1  . azithromycin (ZITHROMAX) 250 MG tablet 2 pills today and then 1 pill daily until finished. 6 each 0  . losartan (COZAAR) 100 MG tablet Take 1 tablet (100 mg total) by mouth daily. 90 tablet 3   No current facility-administered medications for this visit.     Allergies: Allergies  Allergen Reactions  . Naproxen Sodium     REACTION: abdominal cramping    Social History: The patient  reports that he quit smoking about 15 years ago. He does not have any smokeless tobacco history on file. He reports that he drinks about 9.6 oz of alcohol per week . He reports that he does not use drugs.   Family History: The patient's family history includes Coronary artery disease in his brother; Diabetes in his mother; Heart attack (age of onset: 42) in his father; Heart attack (age of onset: 71) in his brother; Hypertension in his brother and  mother; Prostate cancer (age of onset: 27) in his brother.   Review of Systems: Please see the history of present illness.   Otherwise, the review of systems is positive for none.   All other systems are reviewed and negative.   Physical Exam: VS:  BP (!) 136/92   Pulse 73   Ht 5\' 11"  (1.803 m)   Wt 226 lb (102.5 kg)   SpO2 95% Comment: at rest  BMI 31.52 kg/m  .  BMI Body mass index is 31.52 kg/m.  Wt Readings  from Last 3 Encounters:  09/01/15 226 lb (102.5 kg)  08/18/15 219 lb (99.3 kg)  08/11/15 222 lb (100.7 kg)   BP by me is 140/100. His machine read 162/102.  General: Pleasant. Well developed, well nourished and in no acute distress. Seems a little anxious. Congested cough noted.   HEENT: Normal.  Neck: Supple, no JVD, carotid bruits, or masses noted.  Cardiac: Regular rate and rhythm. No murmurs, rubs, or gallops. No edema.  Respiratory:  Lungs are clear to auscultation bilaterally with normal work of breathing. No wheezing at this time.  GI: Soft and nontender.  MS: No deformity or atrophy. Gait and ROM intact.  Skin: Warm and dry. Color is normal.  Neuro:  Strength and sensation are intact and no gross focal deficits noted.  Psych: Alert, appropriate and with normal affect.   LABORATORY DATA:  EKG:  EKG is not ordered today.  Lab Results  Component Value Date   WBC 5.9 08/11/2015   HGB 15.6 08/11/2015   HCT 45.0 08/11/2015   PLT 279 08/11/2015   GLUCOSE 109 (H) 08/11/2015   CHOL 118 07/31/2015   TRIG 230 (H) 07/31/2015   HDL 28 (L) 07/31/2015   LDLDIRECT 117.0 07/10/2014   LDLCALC 44 07/31/2015   ALT 27 07/30/2015   AST 29 07/30/2015   NA 139 08/11/2015   K 4.1 08/11/2015   CL 106 08/11/2015   CREATININE 0.97 08/11/2015   BUN 15 08/11/2015   CO2 23 08/11/2015   TSH 0.594 07/30/2015   PSA 2.36 07/10/2014   INR 1.20 07/30/2015   HGBA1C 5.4 07/30/2015    BNP (last 3 results)  Recent Labs  07/30/15 1830  BNP 38.9    ProBNP (last 3  results) No results for input(s): PROBNP in the last 8760 hours.   Other Studies Reviewed Today:  Coronary Stent Intervention Left Heart Cath and Coronary Angiography 07/30/15   Prox Cx to Mid Cx lesion, 25% stenosed.  Mid LAD lesion, 30% stenosed.  1st Diag lesion, 35% stenosed.  There is mild left ventricular systolic dysfunction.  Dist RCA lesion, 100% stenosed. Post intervention, there is a 0% residual stenosis.  1. Acute inferolateral STEMI secondary to total occlusion of the distal RCA, treated successfully with primary PCI using a drug-eluting stent  2. Mild nonobstructive stenosis of the LAD and left circumflex  3. Mild segmental contraction abnormality left ventricle with well-preserved overall LVEF  Recommendations: Post MI medical therapy. Aspirin and brilinta for at least 12 months. If hospital course is uncomplicated, patient could be transferred out of the ICU tomorrow and discharged home in 48 hours.   Transthoracic Echocardiography 07/31/15 Study Conclusions  - Left ventricle: The cavity size was normal. Systolic function was  normal. The estimated ejection fraction was in the range of 55%  to 60%. Wall motion was normal; there were no regional wall  motion abnormalities. Left ventricular diastolic function  parameters were normal. - Aortic valve: There was trivial regurgitation. - Left atrium: The atrium was mildly dilated. - Atrial septum: No defect or patent foramen ovale was identified  Assessment/Plan: 1. Cough/probable URI - will send for CXR - do not think this is cardiac. Will give him a Zpak - needs to see PCP if persists.  2. Acute inferolateral STEMI secondary to total occlusion of the distal RCA, treated successfully with primary PCI using a drug-eluting stent. Now on DAPT with Plavix. No chest pain.  3. Mild nonobstructive stenosis of the LAD and left circumflex -  to manage medically and with CV risk factor modification.  4. HLD  - on high dose statin therapy - needs labs on next visit.   5. History of alcohol use - encouraged him to try and cut back.   6. HTN - BP is high - he took a dose of ACE today - reminded that he is allergic to this - only on 50 mg of Losartan - will increase to 100 mg and see back as planned.   Current medicines are reviewed with the patient today.  The patient does not have concerns regarding medicines other than what has been noted above.  The following changes have been made:  See above.  Labs/ tests ordered today include:    Orders Placed This Encounter  Procedures  . DG Chest 2 View     Disposition:   FU with me as planned later this month.    Patient is agreeable to this plan and will call if any problems develop in the interim.   Signed: Burtis Junes, RN, ANP-C 09/01/2015 11:36 AM  [facility] [address] [place], [place]  [address] Phone: [phone] Fax: [phone]

## 2015-09-01 NOTE — Patient Instructions (Addendum)
.  We will be checking the following labs today - NONE  Please go to [facility] on the first floor for a chest Xray - you may walk in.    Medication Instructions:    Continue with your current medicines. BUT  I am going to increase the Losartan to 100 mg a day - you can take 2 of your 50 mg tablets to use up - I have sent the 100 mg tablet to your drug store.   I sent in a RX for a zpak - take as directed    Testing/Procedures To Be Arranged:  N/A  Follow-Up:   See me back as planned later this month    Other Special Instructions:   Take your BP cuff back to [facility] - it does not match up.     If you need a refill on your cardiac medications before your next appointment, please call your pharmacy.   Call the [place] office at [phone] if you have any questions, problems or concerns.

## 2015-09-01 NOTE — Telephone Encounter (Signed)
Increase the Losartan to 100 mg a day Will plan on checking BMET at his follow up visit Continue to monitor the BP.

## 2015-09-01 NOTE — Telephone Encounter (Signed)
New message    Pt verbalized that he has a concern about his BP  Pt c/o BP issue: STAT if pt c/o blurred vision, one-sided weakness or slurred speech  1. What are your last 5 BP readings? 140/90 2. Are you having any other symptoms (ex. Dizziness, headache, blurred vision, passed out)? No, slight dizziness only 3. What is your BP issue? high

## 2015-09-09 ENCOUNTER — Telehealth: Payer: Self-pay | Admitting: Nurse Practitioner

## 2015-09-09 ENCOUNTER — Encounter: Payer: Self-pay | Admitting: Nurse Practitioner

## 2015-09-09 NOTE — Telephone Encounter (Signed)
New message       Pt c/o BP issue: STAT if pt c/o blurred vision, one-sided weakness or slurred speech  1. What are your last 5 BP readings? 152/100, 151/101, 150/101  2. Are you having any other symptoms (ex. Dizziness, headache, blurred vision, passed out)?  no 3. What is your BP issue? bp is high.  Please call

## 2015-09-09 NOTE — Telephone Encounter (Signed)
S/w pt states the last  24 hours had bp readings in the 150's and stated before the 24 hours had bp readings in the 140's. Bought a new bp machine and went to pharmacy still bp reading was in the 150's. Stated does not understand why pt was taken off of Lisinopril had been on medication for years and was doing great on it. Will send to Cecille Rubin to advise.

## 2015-09-09 NOTE — Telephone Encounter (Signed)
Spoke to the patient myself.  Reminded about the probable ACE allergy - would not recommend restarting this.   BP currently 126/85.   Would monitor for now.  See back as planned.

## 2015-09-15 ENCOUNTER — Ambulatory Visit: Payer: 59 | Admitting: Internal Medicine

## 2015-09-16 DIAGNOSIS — I2119 ST elevation (STEMI) myocardial infarction involving other coronary artery of inferior wall: Secondary | ICD-10-CM | POA: Diagnosis not present

## 2015-09-22 ENCOUNTER — Ambulatory Visit (INDEPENDENT_AMBULATORY_CARE_PROVIDER_SITE_OTHER): Payer: Medicare Other | Admitting: Nurse Practitioner

## 2015-09-22 ENCOUNTER — Encounter: Payer: Self-pay | Admitting: Nurse Practitioner

## 2015-09-22 ENCOUNTER — Ambulatory Visit: Payer: Medicare Other | Admitting: Nurse Practitioner

## 2015-09-22 VITALS — BP 146/82 | HR 64 | Ht 71.0 in | Wt 226.0 lb

## 2015-09-22 DIAGNOSIS — I10 Essential (primary) hypertension: Secondary | ICD-10-CM

## 2015-09-22 DIAGNOSIS — I259 Chronic ischemic heart disease, unspecified: Secondary | ICD-10-CM

## 2015-09-22 DIAGNOSIS — E785 Hyperlipidemia, unspecified: Secondary | ICD-10-CM

## 2015-09-22 DIAGNOSIS — Z955 Presence of coronary angioplasty implant and graft: Secondary | ICD-10-CM

## 2015-09-22 LAB — BASIC METABOLIC PANEL
BUN: 16 mg/dL (ref 7–25)
CO2: 27 mmol/L (ref 20–31)
Calcium: 9.3 mg/dL (ref 8.6–10.3)
Chloride: 103 mmol/L (ref 98–110)
Creat: 0.95 mg/dL (ref 0.70–1.25)
Glucose, Bld: 114 mg/dL — ABNORMAL HIGH (ref 65–99)
Potassium: 4.3 mmol/L (ref 3.5–5.3)
Sodium: 140 mmol/L (ref 135–146)

## 2015-09-22 LAB — LIPID PANEL
Cholesterol: 84 mg/dL — ABNORMAL LOW (ref 125–200)
HDL: 33 mg/dL — ABNORMAL LOW (ref >=40)
LDL Cholesterol: 27 mg/dL (ref <130)
Total CHOL/HDL Ratio: 2.5 Ratio (ref <=5.0)
Triglycerides: 118 mg/dL (ref <150)
VLDL: 24 mg/dL (ref <30)

## 2015-09-22 LAB — HEPATIC FUNCTION PANEL
ALT: 33 U/L (ref 9–46)
AST: 23 U/L (ref 10–35)
Albumin: 4.6 g/dL (ref 3.6–5.1)
Alkaline Phosphatase: 64 U/L (ref 40–115)
Bilirubin, Direct: 0.2 mg/dL (ref <=0.2)
Indirect Bilirubin: 0.8 mg/dL (ref 0.2–1.2)
Total Bilirubin: 1 mg/dL (ref 0.2–1.2)
Total Protein: 7 g/dL (ref 6.1–8.1)

## 2015-09-22 LAB — CBC
HCT: 44.8 % (ref 38.5–50.0)
Hemoglobin: 15.5 g/dL (ref 13.2–17.1)
MCH: 32.3 pg (ref 27.0–33.0)
MCHC: 34.6 g/dL (ref 32.0–36.0)
MCV: 93.3 fL (ref 80.0–100.0)
MPV: 9.9 fL (ref 7.5–12.5)
Platelets: 226 10*3/uL (ref 140–400)
RBC: 4.8 MIL/uL (ref 4.20–5.80)
RDW: 12.8 % (ref 11.0–15.0)
WBC: 5.4 10*3/uL (ref 3.8–10.8)

## 2015-09-22 NOTE — Progress Notes (Signed)
CARDIOLOGY OFFICE NOTE  Date:  09/22/2015    Mike Ray Date of Birth: 1950-03-31 Medical Record [id number]  PCP:  Binnie Rail, MD  Cardiologist:  Jerel Shepherd  Chief Complaint  Patient presents with  . Hyperlipidemia  . Hypertension  . Coronary Artery Disease    3 week check - seen for Dr. Burt Knack    History of Present Illness: Mike Ray is a 65 y.o. male who presents today for a 3 week check. Seen for Dr. Burt Knack.  He has a history of HTN. No prior history of CAD, but has a strong family history of CAD. He presented to the ED on 07/30/15 with chest pain. 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 He had associated diaphoresis. He took a cold shower but his symptoms persisted, and called EMS. Upon arrival of EMS there was inferolateral ST elevation and a code STEMI was called.  He was taken urgently to the cath lab. He received drug-eluting stent to his RCA. There is some residual mild nonobstructive disease in his LAD. Cath report below.   His troponin was elevated at 48.64 and peaked at greater than 65. Echocardiogram shows preserved EF of 55-60%. Left ventricular diastolic function was normal.   He will be on dual antiplatelet therapy with aspirin and present for at least one year. He was started on metoprolol. He takes an ACE inhibitor already at home, this was continued at discharge. Note that at home he was on lisinopril at home, however at discharge his insurance would no longer cover lisinopril so he was changed to benazepril. We will continue benazepril at 10 mg. His Voltaren was discontinued at discharge as he was started on dual antiplatelet therapy. He was also started on high intensity statin.     I have seen him several times since his MI. First time, he was doing well but medicines were all mixed up. He was taking  both Lisinopril and Lotensin - has had reported dry cough in the past with Lisinopril. I stopped both of these and placed him on Losartan. Has been switched over to Plavix due to cost issues. Has had BP issues as well - goes back to taking his ACE - hopefully got this resolved. This is listed as an allergy for him.   Last seen 3 weeks ago - had had a URI. Had been on vacation and had more alcohol and salt. BP cuff did not match up.   Comes in today. Here alone. Doing well. BP readings from home look ok. Feels good. Swimming every other day. Back to work full time with his [employer]. Not dizzy or lightheaded. No chest pain. URI is resolved.   Past Medical History:  Diagnosis Date  . Acute MI, inferolateral wall, initial episode of care ([place]) 07/30/2015  . Coronary artery disease   . Elevated PSA    Dr Barnie Del  . Gilbert's syndrome   . History of heat stroke 2009  . History of nephrolithiasis   . Hyperlipidemia    NMR 06/2009: LDL [id number]), HDL 40, TG 127. Framingham Study LDL goal =< 130   . Hypertension   . Osteoarthritis of left hip     Past Surgical History:  Procedure Laterality Date  . CARDIAC CATHETERIZATION    . CARDIAC CATHETERIZATION N/A 07/30/2015   Procedure: Left Heart Cath  and Coronary Angiography;  Surgeon: Sherren Mocha, MD;  Location: [facility];  Service: Cardiovascular;  Laterality: N/A;  . CARDIAC CATHETERIZATION N/A 07/30/2015   Procedure: Coronary Stent Intervention;  Surgeon: Sherren Mocha, MD;  Location: [facility];  Service: Cardiovascular;  Laterality: N/A;  Distal RCA- Promus 3.50x16  . COLONOSCOPY  2010   negative  . LITHOTRIPSY  2009   Dr.Duckett, [place]   . TOTAL HIP ARTHROPLASTY  2008   left  . TOTAL HIP ARTHROPLASTY  2004   right     Medications: Current Outpatient Prescriptions  Medication Sig Dispense Refill  . aspirin 81 MG tablet Take 81 mg by mouth daily.    Marland Kitchen atorvastatin (LIPITOR) 80 MG tablet Take 1  tablet (80 mg total) by mouth daily at 6 PM. 30 tablet 12  . B Complex-C (B-COMPLEX WITH VITAMIN C) tablet Take 1 tablet by mouth daily.    . clopidogrel (PLAVIX) 75 MG tablet Take 75 mg by mouth daily.    . fish oil-omega-3 fatty acids 1000 MG capsule Take 1 g by mouth every other day.     . fluticasone (FLONASE) 50 MCG/ACT nasal spray Place 1-2 sprays into both nostrils daily as needed. Allergies  0  . losartan (COZAAR) 100 MG tablet Take 1 tablet (100 mg total) by mouth daily. 90 tablet 3  . metoprolol tartrate (LOPRESSOR) 25 MG tablet Take 0.5 tablets (12.5 mg total) by mouth 2 (two) times daily. 30 tablet 12  . Multiple Vitamin (MULTIVITAMIN) tablet Take 1 tablet by mouth daily.    . nitroGLYCERIN (NITROSTAT) 0.4 MG SL tablet Place 1 tablet (0.4 mg total) under the tongue every 5 (five) minutes x 3 doses as needed for chest pain. 25 tablet 1   No current facility-administered medications for this visit.     Allergies: Allergies  Allergen Reactions  . Naproxen Sodium     REACTION: abdominal cramping    Social History: The patient  reports that he quit smoking about 15 years ago. He has never used smokeless tobacco. He reports that he drinks about 9.6 oz of alcohol per week . He reports that he does not use drugs.   Family History: The patient's family history includes Coronary artery disease in his brother; Diabetes in his mother; Heart attack (age of onset: 44) in his father; Heart attack (age of onset: 37) in his brother; Hypertension in his brother and mother; Prostate cancer (age of onset: 72) in his brother.   Review of Systems: Please see the history of present illness.   Otherwise, the review of systems is positive for none.   All other systems are reviewed and negative.   Physical Exam: VS:  BP (!) 146/82   Pulse 64   Ht 5\' 11"  (1.803 m)   Wt 226 lb (102.5 kg)   BMI 31.52 kg/m  .  BMI Body mass index is 31.52 kg/m.  Wt Readings from Last 3 Encounters:  09/22/15 226  lb (102.5 kg)  09/01/15 226 lb (102.5 kg)  08/18/15 219 lb (99.3 kg)    General: Pleasant. Well developed, well nourished and in no acute distress.   HEENT: Normal.  Neck: Supple, no JVD, carotid bruits, or masses noted.  Cardiac: Regular rate and rhythm. No murmurs, rubs, or gallops. No edema.  Respiratory:  Lungs are clear to auscultation bilaterally with normal work of breathing.  GI: Soft and nontender.  MS: No deformity or atrophy. Gait and ROM intact.  Skin:  Warm and dry. Color is normal.  Neuro:  Strength and sensation are intact and no gross focal deficits noted.  Psych: Alert, appropriate and with normal affect.   LABORATORY DATA:  EKG:  EKG is not ordered today.  Lab Results  Component Value Date   WBC 5.9 08/11/2015   HGB 15.6 08/11/2015   HCT 45.0 08/11/2015   PLT 279 08/11/2015   GLUCOSE 109 (H) 08/11/2015   CHOL 118 07/31/2015   TRIG 230 (H) 07/31/2015   HDL 28 (L) 07/31/2015   LDLDIRECT 117.0 07/10/2014   LDLCALC 44 07/31/2015   ALT 27 07/30/2015   AST 29 07/30/2015   NA 139 08/11/2015   K 4.1 08/11/2015   CL 106 08/11/2015   CREATININE 0.97 08/11/2015   BUN 15 08/11/2015   CO2 23 08/11/2015   TSH 0.594 07/30/2015   PSA 2.36 07/10/2014   INR 1.20 07/30/2015   HGBA1C 5.4 07/30/2015    BNP (last 3 results)  Recent Labs  07/30/15 1830  BNP 38.9    ProBNP (last 3 results) No results for input(s): PROBNP in the last 8760 hours.   Other Studies Reviewed Today:  Coronary Stent Intervention Left Heart Cath and Coronary Angiography 07/30/15   Prox Cx to Mid Cx lesion, 25% stenosed.  Mid LAD lesion, 30% stenosed.  1st Diag lesion, 35% stenosed.  There is mild left ventricular systolic dysfunction.  Dist RCA lesion, 100% stenosed. Post intervention, there is a 0% residual stenosis.  1. Acute inferolateral STEMI secondary to total occlusion of the distal RCA, treated successfully with primary PCI using a drug-eluting stent  2. Mild  nonobstructive stenosis of the LAD and left circumflex  3. Mild segmental contraction abnormality left ventricle with well-preserved overall LVEF  Recommendations: Post MI medical therapy. Aspirin and brilinta for at least 12 months. If hospital course is uncomplicated, patient could be transferred out of the ICU tomorrow and discharged home in 48 hours.   Transthoracic Echocardiography 07/31/15 Study Conclusions  - Left ventricle: The cavity size was normal. Systolic function was  normal. The estimated ejection fraction was in the range of 55%  to 60%. Wall motion was normal; there were no regional wall  motion abnormalities. Left ventricular diastolic function  parameters were normal. - Aortic valve: There was trivial regurgitation. - Left atrium: The atrium was mildly dilated. - Atrial septum: No defect or patent foramen ovale was identified  Assessment/Plan: 1. Cough/probable URI - resolved.  2. Acute inferolateral STEMI secondary to total occlusion of the distal RCA, treated successfully with primary PCI using a drug-eluting stent. Now on DAPT with Plavix. No chest pain.  3. Mild nonobstructive stenosis of the LAD and left circumflex - to manage medically and with CV risk factor modification.  4. HLD - on high dose statin therapy - labs today  5. History of alcohol use - encouraged him to try and cut back.   6. HTN - recheck by me is 132/80 - I have left him on his current regimen. He will continue to monitor at home.   Current medicines are reviewed with the patient today.  The patient does not have concerns regarding medicines other than what has been noted above.  The following changes have been made:  See above.  Labs/ tests ordered today include:    Orders Placed This Encounter  Procedures  . Basic metabolic panel  . CBC  . Hepatic function panel  . Lipid panel     Disposition:   FU  with Dr. Burt Knack in 3 to 4 months.    Patient is agreeable to  this plan and will call if any problems develop in the interim.   Signed: Burtis Junes, RN, ANP-C 09/22/2015 8:17 AM  [facility] [address] [place], [place]  [address] Phone: [phone] Fax: [phone]

## 2015-09-22 NOTE — Patient Instructions (Addendum)
We will be checking the following labs today - BMET, CBC, HPF and lipids   Medication Instructions:    Continue with your current medicines.     Testing/Procedures To Be Arranged:  N/A  Follow-Up:   See Dr. Burt Knack in 3 to 4 months    Other Special Instructions:   Keep a check on your BP for [place]    If you need a refill on your cardiac medications before your next appointment, please call your pharmacy.   Call the [place] office at [phone] if you have any questions, problems or concerns.

## 2015-10-29 ENCOUNTER — Telehealth: Payer: Self-pay | Admitting: Cardiovascular Disease

## 2015-10-29 DIAGNOSIS — Z23 Encounter for immunization: Secondary | ICD-10-CM | POA: Diagnosis not present

## 2015-10-29 NOTE — Telephone Encounter (Signed)
I spoke with the pt and advised that we do recommend that our patients get a flu vaccination.  The pt plans to have this done at his pharmacy.

## 2015-10-29 NOTE — Telephone Encounter (Signed)
New message   Pt verbalized that he is calling to see if he can have the flu shot administered

## 2015-11-13 ENCOUNTER — Ambulatory Visit (INDEPENDENT_AMBULATORY_CARE_PROVIDER_SITE_OTHER): Payer: Medicare Other | Admitting: Internal Medicine

## 2015-11-13 ENCOUNTER — Encounter: Payer: Self-pay | Admitting: Internal Medicine

## 2015-11-13 VITALS — BP 122/78 | HR 75 | Ht 71.0 in | Wt 224.0 lb

## 2015-11-13 DIAGNOSIS — R079 Chest pain, unspecified: Secondary | ICD-10-CM

## 2015-11-13 DIAGNOSIS — I259 Chronic ischemic heart disease, unspecified: Secondary | ICD-10-CM

## 2015-11-13 NOTE — Patient Instructions (Signed)
Medication Instructions:  Your physician recommends that you continue on your current medications as directed. Please refer to the Current Medication list given to you today.   Labwork: None  Testing/Procedures: none  Follow-Up: Your physician recommends that you keep your appointment already scheduled with Dr Burt Knack.         If you need a refill on your cardiac medications before your next appointment, please call your pharmacy.

## 2015-11-13 NOTE — Progress Notes (Signed)
Cardiology Office Note   Date:  11/13/2015   ID:  Mike Ray, DOB Jan 19, 1951, MRN [id number]  PCP:  Binnie Rail, MD  Cardiologist:   Dorris Carnes, MD   F/U of CAD      History of Present Illness: Mike Ray is a 65 y.o. male with a history of CAD  S/p STEMI  Cath on 08/09/15 pt udenrewt DES to RCA  nad mild nonobstructive dz to LAD   His troponin was elevated at 48.64 and peaked at greater than 65. Echocardiogram shows preserved EF of 55-60%. Left ventricular diastolic function was normal.       Pt presents today as walk in  COmplaining of some chest tightness.  Lasts seconds    Not associated with acitvity  Pt says worse with stress       Outpatient Medications Prior to Visit  Medication Sig Dispense Refill  . aspirin 81 MG tablet Take 81 mg by mouth daily.    Marland Kitchen atorvastatin (LIPITOR) 80 MG tablet Take 1 tablet (80 mg total) by mouth daily at 6 PM. 30 tablet 12  . B Complex-C (B-COMPLEX WITH VITAMIN C) tablet Take 1 tablet by mouth daily.    . clopidogrel (PLAVIX) 75 MG tablet Take 75 mg by mouth daily.    . fish oil-omega-3 fatty acids 1000 MG capsule Take 1 g by mouth every other day.     . fluticasone (FLONASE) 50 MCG/ACT nasal spray Place 1-2 sprays into both nostrils daily as needed. Allergies  0  . losartan (COZAAR) 100 MG tablet Take 1 tablet (100 mg total) by mouth daily. 90 tablet 3  . metoprolol tartrate (LOPRESSOR) 25 MG tablet Take 0.5 tablets (12.5 mg total) by mouth 2 (two) times daily. 30 tablet 12  . Multiple Vitamin (MULTIVITAMIN) tablet Take 1 tablet by mouth daily.    . nitroGLYCERIN (NITROSTAT) 0.4 MG SL tablet Place 1 tablet (0.4 mg total) under the tongue every 5 (five) minutes x 3 doses as needed for chest pain. 25 tablet 1   No facility-administered medications prior to visit.      Allergies:   Naproxen sodium   Past Medical History:  Diagnosis Date  . Acute MI, inferolateral wall, initial episode of care ([place]) 07/30/2015  .  Coronary artery disease   . Elevated PSA    Dr Barnie Del  . Gilbert's syndrome   . History of heat stroke 2009  . History of nephrolithiasis   . Hyperlipidemia    NMR 06/2009: LDL [id number]), HDL 40, TG 127. Framingham Study LDL goal =< 130   . Hypertension   . Osteoarthritis of left hip     Past Surgical History:  Procedure Laterality Date  . CARDIAC CATHETERIZATION    . CARDIAC CATHETERIZATION N/A 07/30/2015   Procedure: Left Heart Cath and Coronary Angiography;  Surgeon: Sherren Mocha, MD;  Location: [facility];  Service: Cardiovascular;  Laterality: N/A;  . CARDIAC CATHETERIZATION N/A 07/30/2015   Procedure: Coronary Stent Intervention;  Surgeon: Sherren Mocha, MD;  Location: [facility] [facility];  Service: Cardiovascular;  Laterality: N/A;  Distal RCA- Promus 3.50x16  . COLONOSCOPY  2010   negative  . LITHOTRIPSY  2009   Dr.Duckett, [place]   . TOTAL HIP ARTHROPLASTY  2008   left  . TOTAL HIP ARTHROPLASTY  2004   right     Social History:  The patient  reports that he quit smoking about 15 years ago. He has never used  smokeless tobacco. He reports that he drinks about 9.6 oz of alcohol per week . He reports that he does not use drugs.   Family History:  The patient's family history includes Coronary artery disease in his brother; Diabetes in his mother; Heart attack (age of onset: 42) in his father; Heart attack (age of onset: 36) in his brother; Hypertension in his brother and mother; Prostate cancer (age of onset: 27) in his brother.    ROS:  Please see the history of present illness. All other systems are reviewed and  Negative to the above problem except as noted.    PHYSICAL EXAM: VS:  BP 122/78 (BP Location: Right Leg, Patient Position: Sitting)   Pulse 75   Ht 5\' 11"  (1.803 m)   Wt 224 lb (101.6 kg)   BMI 31.24 kg/m   GEN: Well nourished, well developed, in no acute distress  HEENT: normal  Neck: no JVD, carotid bruits, or masses Cardiac: RRR; no  murmurs, rubs, or gallops,no edema  Respiratory:  clear to auscultation bilaterally, normal work of breathing GI: soft, nontender, nondistended, + BS  No hepatomegaly  MS: no deformity Moving all extremities   Skin: warm and dry, no rash Neuro:  Strength and sensation are intact Psych: euthymic mood, full affect   EKG:  EKG is ordered today.  SR 75 bpm   IWMI  T wave inversion III, AVF   Lipid Panel    Component Value Date/Time   CHOL 84 (L) 09/22/2015 0821   TRIG 118 09/22/2015 0821   HDL 33 (L) 09/22/2015 0821   CHOLHDL 2.5 09/22/2015 0821   VLDL 24 09/22/2015 0821   LDLCALC 27 09/22/2015 0821   LDLDIRECT 117.0 07/10/2014 0916      Wt Readings from Last 3 Encounters:  11/13/15 224 lb (101.6 kg)  09/22/15 226 lb (102.5 kg)  09/01/15 226 lb (102.5 kg)      ASSESSMENT AND PLAN: 1  CP  Pt's current episodes of pain are very atypical for cardiac  Not assoc with activity  Lasts seconds to 1 min  He is active at other times   I am not sure what causing  ? GI  ? Stress   Follow  Keep on same meds  Stay active  2  CAD  REcent STEMI with intervention  As above  Has f/u appt with Ezzie Dural in December  3  HL      Current medicines are reviewed at length with the patient today.  The patient does not have concerns regarding medicines.  Signed, Dorris Carnes, MD  11/13/2015 9:27 AM    [address], [place], [place]  [address] Phone: [phone]; Fax: [phone]

## 2015-11-20 NOTE — Addendum Note (Signed)
Addended by: Rodman Key on: 11/20/2015 10:05 AM   Modules accepted: Orders

## 2015-12-17 ENCOUNTER — Encounter: Payer: Self-pay | Admitting: *Deleted

## 2015-12-29 ENCOUNTER — Ambulatory Visit (INDEPENDENT_AMBULATORY_CARE_PROVIDER_SITE_OTHER): Payer: Medicare Other | Admitting: Cardiovascular Disease

## 2015-12-29 ENCOUNTER — Encounter: Payer: Self-pay | Admitting: Cardiovascular Disease

## 2015-12-29 VITALS — BP 112/70 | HR 68 | Ht 71.0 in | Wt 227.4 lb

## 2015-12-29 DIAGNOSIS — I259 Chronic ischemic heart disease, unspecified: Secondary | ICD-10-CM

## 2015-12-29 DIAGNOSIS — I252 Old myocardial infarction: Secondary | ICD-10-CM

## 2015-12-29 DIAGNOSIS — E782 Mixed hyperlipidemia: Secondary | ICD-10-CM

## 2015-12-29 NOTE — Progress Notes (Signed)
Cardiology Office Note Date:  12/29/2015   ID:  Mike, Ray 09-04-1950, MRN [id number]  PCP:  Binnie Rail, MD  Cardiologist:  Sherren Mocha, MD    Chief Complaint  Patient presents with  . essential hypertension     History of Present Illness: Mike Ray is a 65 y.o. male who presents for follow-up of coronary artery disease. The patient initially presented in July 2017 with an acute inferolateral STEMI. He was treated with primary PCI to right coronary artery. There was mild nonobstructive disease noted in the LAD. LVEF was preserved at 55-60%. His most recent visit here was with Mike Ray 09/22/2015 at which time he was doing well.  The patient is here alone today. He feels well. He denies exertional chest pain or pressure. He's active with his work and with exercise. He denies shortness of breath, orthopnea, PND, or heart palpitations. He has occasional left-sided chest discomfort at rest but this is been self-limited. He has was seen in October by [name] for evaluation and symptoms occur rarely - felt to be noncardiac.    Past Medical History:  Diagnosis Date  . Acute MI, inferolateral wall, initial episode of care ([place]) 07/30/2015  . Coronary artery disease   . Elevated PSA    Dr Barnie Del  . Gilbert's syndrome   . History of heat stroke 2009  . History of nephrolithiasis   . Hyperlipidemia    NMR 06/2009: LDL [id number]), HDL 40, TG 127. Framingham Study LDL goal =< 130   . Hypertension   . Osteoarthritis of left hip     Past Surgical History:  Procedure Laterality Date  . CARDIAC CATHETERIZATION    . CARDIAC CATHETERIZATION N/A 07/30/2015   Procedure: Left Heart Cath and Coronary Angiography;  Surgeon: Sherren Mocha, MD;  Location: [facility];  Service: Cardiovascular;  Laterality: N/A;  . CARDIAC CATHETERIZATION N/A 07/30/2015   Procedure: Coronary Stent Intervention;  Surgeon: Sherren Mocha, MD;  Location: [place] [facility];  Service:  Cardiovascular;  Laterality: N/A;  Distal RCA- Promus 3.50x16  . COLONOSCOPY  2010   negative  . LITHOTRIPSY  2009   Dr.Duckett, [place]   . TOTAL HIP ARTHROPLASTY  2008   left  . TOTAL HIP ARTHROPLASTY  2004   right    Current Outpatient Prescriptions  Medication Sig Dispense Refill  . aspirin 81 MG tablet Take 81 mg by mouth daily.    Marland Kitchen atorvastatin (LIPITOR) 80 MG tablet Take 1 tablet (80 mg total) by mouth daily at 6 PM. 30 tablet 12  . clopidogrel (PLAVIX) 75 MG tablet Take 75 mg by mouth daily.    . fish oil-omega-3 fatty acids 1000 MG capsule Take 1 g by mouth every other day.     . losartan (COZAAR) 100 MG tablet Take 1 tablet (100 mg total) by mouth daily. 90 tablet 3  . metoprolol tartrate (LOPRESSOR) 25 MG tablet Take 0.5 tablets (12.5 mg total) by mouth 2 (two) times daily. 30 tablet 12  . Multiple Vitamin (MULTIVITAMIN) tablet Take 1 tablet by mouth daily.    . nitroGLYCERIN (NITROSTAT) 0.4 MG SL tablet Place 1 tablet (0.4 mg total) under the tongue every 5 (five) minutes x 3 doses as needed for chest pain. 25 tablet 1   No current facility-administered medications for this visit.     Allergies:   Naproxen sodium   Social History:  The patient  reports that he quit smoking about 15  years ago. He has never used smokeless tobacco. He reports that he drinks about 9.6 oz of alcohol per week . He reports that he does not use drugs.   Family History:  The patient's  family history includes Coronary artery disease in his brother; Diabetes in his mother; Heart attack (age of onset: 76) in his father; Heart attack (age of onset: 98) in his brother; Hypertension in his brother and mother; Prostate cancer (age of onset: 93) in his brother.   ROS:  Please see the history of present illness.  Otherwise, review of systems is positive for neck stiffness.  All other systems are reviewed and negative.   PHYSICAL EXAM: VS:  BP 112/70   Pulse 68   Ht 5\' 11"  (1.803 m)   Wt 227 lb 6.4  oz (103.1 kg)   BMI 31.72 kg/m  , BMI Body mass index is 31.72 kg/m. GEN: Well nourished, well developed, in no acute distress  HEENT: normal  Neck: no JVD, no masses. No carotid bruits Cardiac: RRR without murmur or gallop                Respiratory:  clear to auscultation bilaterally, normal work of breathing GI: soft, nontender, nondistended, + BS MS: no deformity or atrophy  Ext: no pretibial edema, pedal pulses 2+= bilaterally Skin: warm and dry, no rash Neuro:  Strength and sensation are intact Psych: euthymic mood, full affect  EKG:  EKG is not ordered today.  Recent Labs: 07/30/2015: B Natriuretic Peptide 38.9; TSH 0.594 09/22/2015: ALT 33; BUN 16; Creat 0.95; Hemoglobin 15.5; Platelets 226; Potassium 4.3; Sodium 140   Lipid Panel     Component Value Date/Time   CHOL 84 (L) 09/22/2015 0821   TRIG 118 09/22/2015 0821   HDL 33 (L) 09/22/2015 0821   CHOLHDL 2.5 09/22/2015 0821   VLDL 24 09/22/2015 0821   LDLCALC 27 09/22/2015 0821   LDLDIRECT 117.0 07/10/2014 0916      Wt Readings from Last 3 Encounters:  12/29/15 227 lb 6.4 oz (103.1 kg)  11/13/15 224 lb (101.6 kg)  09/22/15 226 lb (102.5 kg)     Cardiac Studies Reviewed: Coronary Stent Intervention Left Heart Cath and Coronary Angiography 07/30/15   Prox Cx to Mid Cx lesion, 25% stenosed.  Mid LAD lesion, 30% stenosed.  1st Diag lesion, 35% stenosed.  There is mild left ventricular systolic dysfunction.  Dist RCA lesion, 100% stenosed. Post intervention, there is a 0% residual stenosis.  1. Acute inferolateral STEMI secondary to total occlusion of the distal RCA, treated successfully with primary PCI using a drug-eluting stent  2. Mild nonobstructive stenosis of the LAD and left circumflex  3. Mild segmental contraction abnormality left ventricle with well-preserved overall LVEF  Recommendations: Post MI medical therapy. Aspirin and brilinta for at least 12 months. If hospital course is  uncomplicated, patient could be transferred out of the ICU tomorrow and discharged home in 48 hours.   Transthoracic Echocardiography 07/31/15 Study Conclusions  - Left ventricle: The cavity size was normal. Systolic function was  normal. The estimated ejection fraction was in the range of 55%  to 60%. Wall motion was normal; there were no regional wall  motion abnormalities. Left ventricular diastolic function  parameters were normal. - Aortic valve: There was trivial regurgitation. - Left atrium: The atrium was mildly dilated. - Atrial septum: No defect or patent foramen ovale was identified  ASSESSMENT AND PLAN: 1.  CAD, native vessel, with old MI (inferolateral MI 07-2015):  atypical angina on occasion. Overall doing very well without exertional symptoms. Medical program reviewed and will be continued without change.   2. HTN: well-controlled on losartan and metoprolol. FU CMET when he returns in July 2018.  3. Hyperlipidemia: Labs reviewed as above. LDL at goal. Total cholesterol is very low. Continue atorvastatin. FU lipids when he returns in July 2018.   Current medicines are reviewed with the patient today.  The patient does not have concerns regarding medicines.  Labs/ tests ordered today include:  No orders of the defined types were placed in this encounter.   Disposition:   FU July 2018 - consider discontinuation of plavix at that time.   Deatra James, MD  12/29/2015 8:47 AM    [facility] [address], [place], [place]  [address] Phone: [phone]; Fax: [phone]

## 2015-12-29 NOTE — Patient Instructions (Signed)
Continue same medications   Your physician wants you to follow-up in: 07/2016 . You will receive a reminder letter in the mail two months in advance. If you don't receive a letter, please call our office to schedule the follow-up appointment.    Fasting Cmet and Lipid Panel before appointment with Dr.Cooper

## 2016-01-04 DIAGNOSIS — I2510 Atherosclerotic heart disease of native coronary artery without angina pectoris: Secondary | ICD-10-CM | POA: Insufficient documentation

## 2016-01-04 DIAGNOSIS — I252 Old myocardial infarction: Secondary | ICD-10-CM | POA: Insufficient documentation

## 2016-01-04 NOTE — Progress Notes (Signed)
Subjective:    Patient ID: Mike Ray, male    DOB: March 31, 1950, 64 y.o.   MRN: [id number]  HPI Here for medicare wellness exam and follow up of his chronic medical problems.   For the past five weeks he has had neck pain.  He thinks he slept wrong. He can hear some grinding in his neck.  It is gotten better - about 80% better.    CAD, Hypertension: He is taking his medication daily. He is compliant with a low sodium diet.  He denies chest pain, palpitations, edema, shortness of breath and regular headaches. He is exercising regularly.    Hyperlipidemia: He is taking his medication daily. He is compliant with a low fat/cholesterol diet. He is exercising regularly. He denies myalgias.       I have personally reviewed and have noted 1.The patient's medical and social history 2.Their use of alcohol, tobacco or illicit drugs 3.Their current medications and supplements 4.The patient's functional ability including ADL's, fall risks, home safety risks and hearing or visual impairment. 5.Diet and physical activities 6.Evidence for depression or mood disorders 7.Care team reviewed -  Cardiology - dr cooper   Are there smokers in your home (other than you)? No  Risk Factors Exercise:  Swims, owns landscaping business Dietary issues discussed:  Well balanced, limits sugars, eats lots of veges, working on increasing fruits  Cardiac risk factors: advanced age, hypertension, hyperlipidemia, and obesity (BMI >= 30 kg/m2).  Depression Screen  Have you felt down, depressed or hopeless? No  Have you felt little interest or pleasure in doing things?  No  Activities of Daily Living In your present state of health, do you have any difficulty performing the following activities?:  Driving? No Managing money?  No Feeding yourself? No Getting from bed to chair? No Climbing a flight of stairs? No Preparing food and eating?:  No Bathing or showering? No Getting dressed: No Getting to/using the toilet? No Moving around from place to place: No In the past year have you fallen or had a near fall?: No   Are you sexually active?  yes  Do you have more than one partner?  no  Hearing Difficulties:  Do you often ask people to speak up or repeat themselves? Yes, left ear only Do you experience ringing or noises in your ears? No Do you have difficulty understanding soft or whispered voices? yes Vision:              Any change in vision:  No, just aging related             Up to date with eye exam:  No  Memory:  Do you feel that you have a problem with memory? No  Do you often misplace items? No  Do you feel safe at home?  Yes  Cognitive Testing  Alert, Orientated? Yes  Normal Appearance? Yes  Recall of three objects?  Yes  Can perform simple calculations? Yes  Displays appropriate judgment? Yes  Can read the correct time from a watch face? Yes   Advanced Directives have been discussed with the patient? Yes - in place   Medications and allergies reviewed with patient and updated if appropriate.  Patient Active Problem List   Diagnosis Date Noted  . CAD (coronary artery disease) 01/04/2016  . History of ST elevation myocardial infarction (STEMI) 01/04/2016  . NEPHROLITHIASIS, HX OF 10/22/2008  . CHOLELITHIASIS 11/08/2007  . JAUNDICE 10/30/2007  . Essential hypertension 10/17/2007  .  HYPERLIPIDEMIA 12/13/2006  . ELEVATED PROSTATE SPECIFIC ANTIGEN 09/16/2006    Current Outpatient Prescriptions on File Prior to Visit  Medication Sig Dispense Refill  . aspirin 81 MG tablet Take 81 mg by mouth daily.    Marland Kitchen atorvastatin (LIPITOR) 80 MG tablet Take 1 tablet (80 mg total) by mouth daily at 6 PM. 30 tablet 12  . clopidogrel (PLAVIX) 75 MG tablet Take 75 mg by mouth daily.    . fish oil-omega-3 fatty acids 1000 MG capsule Take 1 g by mouth every other day.     . losartan (COZAAR) 100 MG tablet Take 1 tablet  (100 mg total) by mouth daily. 90 tablet 3  . metoprolol tartrate (LOPRESSOR) 25 MG tablet Take 0.5 tablets (12.5 mg total) by mouth 2 (two) times daily. 30 tablet 12  . Multiple Vitamin (MULTIVITAMIN) tablet Take 1 tablet by mouth daily.    . nitroGLYCERIN (NITROSTAT) 0.4 MG SL tablet Place 1 tablet (0.4 mg total) under the tongue every 5 (five) minutes x 3 doses as needed for chest pain. 25 tablet 1   No current facility-administered medications on file prior to visit.     Past Medical History:  Diagnosis Date  . Acute MI, inferolateral wall, initial episode of care ([place]) 07/30/2015  . Coronary artery disease   . Elevated PSA    Dr Barnie Del  . Gilbert's syndrome   . History of heat stroke 2009  . History of nephrolithiasis   . Hyperlipidemia    NMR 06/2009: LDL [id number]), HDL 40, TG 127. Framingham Study LDL goal =< 130   . Hypertension   . Osteoarthritis of left hip     Past Surgical History:  Procedure Laterality Date  . CARDIAC CATHETERIZATION    . CARDIAC CATHETERIZATION N/A 07/30/2015   Procedure: Left Heart Cath and Coronary Angiography;  Surgeon: Sherren Mocha, MD;  Location: [place];  Service: Cardiovascular;  Laterality: N/A;  . CARDIAC CATHETERIZATION N/A 07/30/2015   Procedure: Coronary Stent Intervention;  Surgeon: Sherren Mocha, MD;  Location: [facility];  Service: Cardiovascular;  Laterality: N/A;  Distal RCA- Promus 3.50x16  . COLONOSCOPY  2010   negative  . LITHOTRIPSY  2009   Dr.Duckett, [place]   . TOTAL HIP ARTHROPLASTY  2008   left  . TOTAL HIP ARTHROPLASTY  2004   right    Social History   Social History  . Marital status: Divorced    Spouse name: N/A  . Number of children: N/A  . Years of education: N/A   Social History Main Topics  . Smoking status: Former Smoker    Quit date: 01/26/2000  . Smokeless tobacco: Never Used     Comment: Age 23  . Alcohol use 9.6 oz/week    16 Cans of beer per week  . Drug use: No  . Sexual  activity: Yes    Birth control/ protection: Other-see comments   Other Topics Concern  . Not on file   Social History Narrative  . No narrative on file    Family History  Problem Relation Age of Onset  . Diabetes Mother   . Hypertension Mother   . Heart attack Father 63    smoker  . Prostate cancer Brother 48    CAD; stents @ 19  . Hypertension Brother   . Heart attack Brother 65  . Coronary artery disease Brother     stents late 22s  . Stroke Neg Hx     Review  of Systems  Constitutional: Negative for appetite change, chills, fatigue, fever and unexpected weight change.  HENT: Positive for hearing loss (left ear). Negative for tinnitus.   Eyes: Negative for visual disturbance.  Respiratory: Negative for cough, shortness of breath and wheezing.   Cardiovascular: Negative for chest pain, palpitations and leg swelling.  Gastrointestinal: Negative for abdominal pain, blood in stool, constipation, diarrhea and nausea.       Often gerd  Genitourinary: Negative for difficulty urinating, dysuria and hematuria.  Musculoskeletal: Positive for arthralgias, back pain and neck pain.  Skin: Negative for color change and rash.  Neurological: Negative for dizziness, light-headedness and headaches.  Psychiatric/Behavioral: Negative for dysphoric mood. The patient is not nervous/anxious.        Objective:   Vitals:   01/05/16 0753  BP: 116/80  Pulse: 67  Resp: 16  Temp: 97.8 F (36.6 C)   Filed Weights   01/05/16 0753  Weight: 226 lb (102.5 kg)   Body mass index is 31.52 kg/m.   Physical Exam Constitutional: He appears well-developed and well-nourished. No distress.  HENT:  Head: Normocephalic and atraumatic.  Right Ear: External ear normal.  Left Ear: External ear normal.  Mouth/Throat: Oropharynx is clear and moist.  Normal ear canals and TM b/l  Eyes: Conjunctivae and EOM are normal.  Neck: Neck supple. No tracheal deviation present. No thyromegaly present.  No  carotid bruit  Cardiovascular: Normal rate, regular rhythm, normal heart sounds and intact distal pulses.   No murmur heard. Pulmonary/Chest: Effort normal and breath sounds normal. No respiratory distress. He has no wheezes. He has no rales.  Abdominal: Soft. Bowel sounds are normal. He exhibits no distension. There is no tenderness.  Genitourinary: normal prostate w/o nodules Musculoskeletal: He exhibits no edema.  Lymphadenopathy:   He has no cervical adenopathy.  Skin: Skin is warm and dry. He is not diaphoretic.  Psychiatric: He has a normal mood and affect. His behavior is normal.         Assessment & Plan:   Wellness Exam: Immunizations  prevnar today, other up to date Colonoscopy  Up to date  Eye exam - not up to date - will schedule Hearing loss   - left ear only, does not want referral at this time Memory concerns/difficulties  none Independent of ADLs  Fully independent Advised weight loss Stressed the importance of regular exercise, which he is doing   Patient received copy of preventative screening tests/immunizations recommended for the next 5-10 years.   See Problem List for Assessment and Plan of chronic medical problems.  F/u in 6 months

## 2016-01-04 NOTE — Assessment & Plan Note (Signed)
Check lipid panel  Continue daily statin Regular exercise and healthy diet encouraged  

## 2016-01-04 NOTE — Patient Instructions (Addendum)
Mike Ray , Thank you for taking time to come for your Medicare Wellness Visit. I appreciate your ongoing commitment to your health goals. Please review the following plan we discussed and let me know if I can assist you in the future.   These are the goals we discussed: Goals    Work on weight loss, schedule an eye appt      This is a list of the screening recommended for you and due dates:  Health Maintenance  Topic Date Due  . HIV Screening  today  . Pneumonia vaccines (1 of 2 - PCV13) today  . Flu Shot  08/26/2015  . Colon Cancer Screening  01/10/2020  . Tetanus Vaccine  05/30/2022  . Shingles Vaccine  Completed  .  Hepatitis C: One time screening is recommended by Center for Disease Control  (CDC) for  adults born from [birth year] through 1965.   Completed     Test(s) ordered today. Your results will be released to [place] (or called to you) after review, usually within 72hours after test completion. If any changes need to be made, you will be notified at that same time.  All other Health Maintenance issues reviewed.   All recommended immunizations and age-appropriate screenings are up-to-date or discussed.  Prevnar immunization administered today.   Medications reviewed and updated.  No changes recommended at this time.  Please followup in 6 months    Health Maintenance, Male A healthy lifestyle and preventative care can promote health and wellness.  Maintain regular health, dental, and eye exams.  Eat a healthy diet. Foods like vegetables, fruits, whole grains, low-fat dairy products, and lean protein foods contain the nutrients you need and are low in calories. Decrease your intake of foods high in solid fats, added sugars, and salt. Get information about a proper diet from your health care provider, if necessary.  Regular physical exercise is one of the most important things you can do for your health. Most adults should get at least 150 minutes of moderate-intensity  exercise (any activity that increases your heart rate and causes you to sweat) each week. In addition, most adults need muscle-strengthening exercises on 2 or more days a week.   Maintain a healthy weight. The body mass index (BMI) is a screening tool to identify possible weight problems. It provides an estimate of body fat based on height and weight. Your health care provider can find your BMI and can help you achieve or maintain a healthy weight. For males 20 years and older:  A BMI below 18.5 is considered underweight.  A BMI of 18.5 to 24.9 is normal.  A BMI of 25 to 29.9 is considered overweight.  A BMI of 30 and above is considered obese.  Maintain normal blood lipids and cholesterol by exercising and minimizing your intake of saturated fat. Eat a balanced diet with plenty of fruits and vegetables. Blood tests for lipids and cholesterol should begin at age 53 and be repeated every 5 years. If your lipid or cholesterol levels are high, you are over age 23, or you are at high risk for heart disease, you may need your cholesterol levels checked more frequently.Ongoing high lipid and cholesterol levels should be treated with medicines if diet and exercise are not working.  If you smoke, find out from your health care provider how to quit. If you do not use tobacco, do not start.  Lung cancer screening is recommended for adults aged 59-80 years who are at high risk  for developing lung cancer because of a history of smoking. A yearly low-dose CT scan of the lungs is recommended for people who have at least a 30-pack-year history of smoking and are current smokers or have quit within the past 15 years. A pack year of smoking is smoking an average of 1 pack of cigarettes a day for 1 year (for example, a 30-pack-year history of smoking could mean smoking 1 pack a day for 30 years or 2 packs a day for 15 years). Yearly screening should continue until the smoker has stopped smoking for at least 15  years. Yearly screening should be stopped for people who develop a health problem that would prevent them from having lung cancer treatment.  If you choose to drink alcohol, do not have more than 2 drinks per day. One drink is considered to be 12 oz (360 mL) of beer, 5 oz (150 mL) of wine, or 1.5 oz (45 mL) of liquor.  Avoid the use of street drugs. Do not share needles with anyone. Ask for help if you need support or instructions about stopping the use of drugs.  High blood pressure causes heart disease and increases the risk of stroke. High blood pressure is more likely to develop in:  People who have blood pressure in the end of the normal range (100-139/85-89 mm Hg).  People who are overweight or obese.  People who are African American.  If you are 38-74 years of age, have your blood pressure checked every 3-5 years. If you are 50 years of age or older, have your blood pressure checked every year. You should have your blood pressure measured twice-once when you are at a hospital or clinic, and once when you are not at a hospital or clinic. Record the average of the two measurements. To check your blood pressure when you are not at a hospital or clinic, you can use:  An automated blood pressure machine at a pharmacy.  A home blood pressure monitor.  If you are 5-72 years old, ask your health care provider if you should take aspirin to prevent heart disease.  Diabetes screening involves taking a blood sample to check your fasting blood sugar level. This should be done once every 3 years after age 41 if you are at a normal weight and without risk factors for diabetes. Testing should be considered at a younger age or be carried out more frequently if you are overweight and have at least 1 risk factor for diabetes.  Colorectal cancer can be detected and often prevented. Most routine colorectal cancer screening begins at the age of 33 and continues through age 26. However, your health care  provider may recommend screening at an earlier age if you have risk factors for colon cancer. On a yearly basis, your health care provider may provide home test kits to check for hidden blood in the stool. A small camera at the end of a tube may be used to directly examine the colon (sigmoidoscopy or colonoscopy) to detect the earliest forms of colorectal cancer. Talk to your health care provider about this at age 72 when routine screening begins. A direct exam of the colon should be repeated every 5-10 years through age 7, unless early forms of precancerous polyps or small growths are found.  People who are at an increased risk for hepatitis B should be screened for this virus. You are considered at high risk for hepatitis B if:  You were born in a country where  hepatitis B occurs often. Talk with your health care provider about which countries are considered high risk.  Your parents were born in a high-risk country and you have not received a shot to protect against hepatitis B (hepatitis B vaccine).  You have HIV or AIDS.  You use needles to inject street drugs.  You live with, or have sex with, someone who has hepatitis B.  You are a man who has sex with other men (MSM).  You get hemodialysis treatment.  You take certain medicines for conditions like cancer, organ transplantation, and autoimmune conditions.  Hepatitis C blood testing is recommended for all people born from [birth year] through 1965 and any individual with known risk factors for hepatitis C.  Healthy men should no longer receive prostate-specific antigen (PSA) blood tests as part of routine cancer screening. Talk to your health care provider about prostate cancer screening.  Testicular cancer screening is not recommended for adolescents or adult males who have no symptoms. Screening includes self-exam, a health care provider exam, and other screening tests. Consult with your health care provider about any symptoms you have or any  concerns you have about testicular cancer.  Practice safe sex. Use condoms and avoid high-risk sexual practices to reduce the spread of sexually transmitted infections (STIs).  You should be screened for STIs, including gonorrhea and chlamydia if:  You are sexually active and are younger than 24 years.  You are older than 24 years, and your health care provider tells you that you are at risk for this type of infection.  Your sexual activity has changed since you were last screened, and you are at an increased risk for chlamydia or gonorrhea. Ask your health care provider if you are at risk.  If you are at risk of being infected with HIV, it is recommended that you take a prescription medicine daily to prevent HIV infection. This is called pre-exposure prophylaxis (PrEP). You are considered at risk if:  You are a man who has sex with other men (MSM).  You are a heterosexual man who is sexually active with multiple partners.  You take drugs by injection.  You are sexually active with a partner who has HIV.  Talk with your health care provider about whether you are at high risk of being infected with HIV. If you choose to begin PrEP, you should first be tested for HIV. You should then be tested every 3 months for as long as you are taking PrEP.  Use sunscreen. Apply sunscreen liberally and repeatedly throughout the day. You should seek shade when your shadow is shorter than you. Protect yourself by wearing long sleeves, pants, a wide-brimmed hat, and sunglasses year round whenever you are outdoors.  Tell your health care provider of new moles or changes in moles, especially if there is a change in shape or color. Also, tell your health care provider if a mole is larger than the size of a pencil eraser.  A one-time screening for abdominal aortic aneurysm (AAA) and surgical repair of large AAAs by ultrasound is recommended for men aged 41-75 years who are current or former smokers.  Stay  current with your vaccines (immunizations). This information is not intended to replace advice given to you by your health care provider. Make sure you discuss any questions you have with your health care provider. Document Released: 07/10/2007 Document Revised: 02/01/2014 Document Reviewed: 10/15/2014 Elsevier Interactive Patient Education  2017 [employer].

## 2016-01-05 ENCOUNTER — Encounter: Payer: Self-pay | Admitting: Internal Medicine

## 2016-01-05 ENCOUNTER — Ambulatory Visit (INDEPENDENT_AMBULATORY_CARE_PROVIDER_SITE_OTHER): Payer: Medicare Other | Admitting: Internal Medicine

## 2016-01-05 ENCOUNTER — Other Ambulatory Visit (INDEPENDENT_AMBULATORY_CARE_PROVIDER_SITE_OTHER): Payer: Medicare Other

## 2016-01-05 VITALS — BP 116/80 | HR 67 | Temp 97.8°F | Resp 16 | Ht 71.0 in | Wt 226.0 lb

## 2016-01-05 DIAGNOSIS — Z114 Encounter for screening for human immunodeficiency virus [HIV]: Secondary | ICD-10-CM | POA: Diagnosis not present

## 2016-01-05 DIAGNOSIS — I10 Essential (primary) hypertension: Secondary | ICD-10-CM

## 2016-01-05 DIAGNOSIS — R972 Elevated prostate specific antigen [PSA]: Secondary | ICD-10-CM

## 2016-01-05 DIAGNOSIS — Z125 Encounter for screening for malignant neoplasm of prostate: Secondary | ICD-10-CM | POA: Diagnosis not present

## 2016-01-05 DIAGNOSIS — M542 Cervicalgia: Secondary | ICD-10-CM

## 2016-01-05 DIAGNOSIS — Z0000 Encounter for general adult medical examination without abnormal findings: Secondary | ICD-10-CM | POA: Diagnosis not present

## 2016-01-05 DIAGNOSIS — E782 Mixed hyperlipidemia: Secondary | ICD-10-CM

## 2016-01-05 DIAGNOSIS — I259 Chronic ischemic heart disease, unspecified: Secondary | ICD-10-CM

## 2016-01-05 DIAGNOSIS — Z23 Encounter for immunization: Secondary | ICD-10-CM | POA: Diagnosis not present

## 2016-01-05 DIAGNOSIS — I2510 Atherosclerotic heart disease of native coronary artery without angina pectoris: Secondary | ICD-10-CM

## 2016-01-05 LAB — COMPREHENSIVE METABOLIC PANEL
ALT: 27 U/L (ref 0–53)
AST: 21 U/L (ref 0–37)
Albumin: 4.8 g/dL (ref 3.5–5.2)
Alkaline Phosphatase: 52 U/L (ref 39–117)
BILIRUBIN TOTAL: 1.6 mg/dL — AB (ref 0.2–1.2)
BUN: 18 mg/dL (ref 6–23)
CO2: 26 meq/L (ref 19–32)
CREATININE: 0.86 mg/dL (ref 0.40–1.50)
Calcium: 9.3 mg/dL (ref 8.4–10.5)
Chloride: 104 mEq/L (ref 96–112)
GFR: 94.67 mL/min (ref >60.00)
GLUCOSE: 112 mg/dL — AB (ref 70–99)
Potassium: 4.7 mEq/L (ref 3.5–5.1)
SODIUM: 139 meq/L (ref 135–145)
Total Protein: 7 g/dL (ref 6.0–8.3)

## 2016-01-05 LAB — CBC WITH DIFFERENTIAL/PLATELET
BASOS ABS: 0 10*3/uL (ref 0.0–0.1)
Basophils Relative: 0.4 % (ref 0.0–3.0)
EOS ABS: 0.1 10*3/uL (ref 0.0–0.7)
Eosinophils Relative: 2 % (ref 0.0–5.0)
HCT: 44.6 % (ref 39.0–52.0)
Hemoglobin: 15.5 g/dL (ref 13.0–17.0)
LYMPHS ABS: 1.7 10*3/uL (ref 0.7–4.0)
Lymphocytes Relative: 29.9 % (ref 12.0–46.0)
MCHC: 34.8 g/dL (ref 30.0–36.0)
MCV: 93.8 fl (ref 78.0–100.0)
MONO ABS: 0.4 10*3/uL (ref 0.1–1.0)
Monocytes Relative: 7.9 % (ref 3.0–12.0)
NEUTROS ABS: 3.4 10*3/uL (ref 1.4–7.7)
NEUTROS PCT: 59.8 % (ref 43.0–77.0)
PLATELETS: 240 10*3/uL (ref 150.0–400.0)
RBC: 4.76 Mil/uL (ref 4.22–5.81)
RDW: 12.9 % (ref 11.5–15.5)
WBC: 5.6 10*3/uL (ref 4.0–10.5)

## 2016-01-05 LAB — LIPID PANEL
CHOL/HDL RATIO: 3
Cholesterol: 91 mg/dL (ref 0–200)
HDL: 36.1 mg/dL — AB (ref >39.00)
LDL Cholesterol: 27 mg/dL (ref 0–99)
NONHDL: 55.21
Triglycerides: 141 mg/dL (ref 0.0–149.0)
VLDL: 28.2 mg/dL (ref 0.0–40.0)

## 2016-01-05 LAB — PSA, MEDICARE: PSA: 2.6 ng/ml (ref 0.10–4.00)

## 2016-01-05 LAB — TSH: TSH: 1.37 u[IU]/mL (ref 0.35–4.50)

## 2016-01-05 NOTE — Assessment & Plan Note (Signed)
Doing well, no chest pain, palps, sob Up to date with Dr Burt Knack Check full labs

## 2016-01-05 NOTE — Assessment & Plan Note (Signed)
Not following with urology at this time Check psa

## 2016-01-05 NOTE — Assessment & Plan Note (Signed)
Muscular in nature Improving Using warm/cold packs Continue symptomatic treatment

## 2016-01-05 NOTE — Assessment & Plan Note (Signed)
BP well controlled Current regimen effective and well tolerated Continue current medications at current doses Cmp, tsh 

## 2016-01-05 NOTE — Progress Notes (Signed)
Pre visit review using our clinic review tool, if applicable. No additional management support is needed unless otherwise documented below in the visit note. 

## 2016-01-06 LAB — HIV ANTIBODY (ROUTINE TESTING W REFLEX): HIV 1&2 Ab, 4th Generation: NONREACTIVE

## 2016-01-07 ENCOUNTER — Encounter: Payer: Self-pay | Admitting: Internal Medicine

## 2016-01-16 DIAGNOSIS — M25551 Pain in right hip: Secondary | ICD-10-CM | POA: Diagnosis not present

## 2016-01-16 DIAGNOSIS — M7061 Trochanteric bursitis, right hip: Secondary | ICD-10-CM | POA: Diagnosis not present

## 2016-03-05 ENCOUNTER — Telehealth: Payer: Self-pay | Admitting: Internal Medicine

## 2016-03-05 MED ORDER — AMLODIPINE BESYLATE 5 MG PO TABS: 5.0000 mg | ORAL_TABLET | Freq: Every day | ORAL | 3 refills | Status: DC

## 2016-03-05 NOTE — Telephone Encounter (Signed)
[place] 150/101 home  Taking 172/123 [facility]             188/122  Taking medication as prescribed and is worried about his BP readings today and had some concerns. I sent the call to [facility]

## 2016-03-05 NOTE — Telephone Encounter (Signed)
His BP looked like it was well controlled previously - make sure he is not taking anything OTC (cold meds).   Start amlodipine 5 mg daily.  Follow up next week (rx sent)

## 2016-03-05 NOTE — Telephone Encounter (Signed)
Spoke with pt to inform.  

## 2016-03-05 NOTE — Telephone Encounter (Signed)
Please advise 

## 2016-03-05 NOTE — Telephone Encounter (Signed)
[place] - [facility] Patient Name: Mike Ray DOB: 02/09/1950 Initial Comment on BP med, recent reading 172/83, no symptoms Nurse Assessment Nurse: Renie Ora, RN, Ashby Dawes Date/Time (Eastern Time): 03/05/2016 3:18:09 PM Confirm and document reason for call. If symptomatic, describe symptoms. ---Caller states that he is on BP meds. States that took BP at home and was getting readings around 150/100. States that he went to [facility] and checked it there and was 188/122 and 173/124. States that he has no symptoms and feels fine. Just checked it as he normally does. Does the patient have any new or worsening symptoms? ---Colbert Ewing a triage be completed? ---Yes Related visit to physician within the last 2 weeks? ---No Does the PT have any chronic conditions? (i.e. diabetes, asthma, etc.) ---Yes List chronic conditions. ---heart attack in July 2017, high blood pressure Is this a behavioral health or substance abuse call? ---No Guidelines Guideline Title Affirmed Question Affirmed Notes High Blood Pressure BP # 180/110 Final Disposition User See Physician within [place], RN, [employer] nurse, Tamala Fothergill, will check Epic to schedule an appt for patient within 24 hours. Explained to patient that someone will call him back with an appt time if possible or further instructions. no appointment available and the caller was reached and wants to go to [facility] [place] [facility] [place] Disagree/Comply: Comply

## 2016-03-23 DIAGNOSIS — J340 Abscess, furuncle and carbuncle of nose: Secondary | ICD-10-CM | POA: Diagnosis not present

## 2016-03-29 DIAGNOSIS — Z85828 Personal history of other malignant neoplasm of skin: Secondary | ICD-10-CM | POA: Diagnosis not present

## 2016-03-29 DIAGNOSIS — L570 Actinic keratosis: Secondary | ICD-10-CM | POA: Diagnosis not present

## 2016-03-29 DIAGNOSIS — Z08 Encounter for follow-up examination after completed treatment for malignant neoplasm: Secondary | ICD-10-CM | POA: Diagnosis not present

## 2016-06-10 DIAGNOSIS — L03115 Cellulitis of right lower limb: Secondary | ICD-10-CM | POA: Diagnosis not present

## 2016-06-10 DIAGNOSIS — R229 Localized swelling, mass and lump, unspecified: Secondary | ICD-10-CM | POA: Diagnosis not present

## 2016-07-04 DIAGNOSIS — R7303 Prediabetes: Secondary | ICD-10-CM | POA: Insufficient documentation

## 2016-07-04 NOTE — Progress Notes (Signed)
Subjective:    Patient ID: Mike Ray, male    DOB: 1950-09-04, 66 y.o.   MRN: [id number]  HPI The patient is here for follow up.  Recently he has had a mild ache in his left chest. It is not true pain - just an ache.  It has been there for a few weeks.  It is intermittent, but there daily.  He sees cardiology next month.  He has discussed this with him last fall.  He denies pain with activity.    CAD, Hypertension: He is taking his medication daily. He is compliant with a low sodium diet.  He denies palpitations, edema, shortness of breath and regular headaches. He is exercising regularly.      Hyperlipidemia: He is taking his medication daily. He is compliant with a low fat/cholesterol diet. He is exercising regularly. He denies myalgias.   Hyperglycemia:  He is trying to limit his sugars/carb intake.  He is exercising daily.   He had a tick pulled off of him last month.  The area where the tick was does itch at times.  He denies any concerning symptoms.     Medications and allergies reviewed with patient and updated if appropriate.  Patient Active Problem List   Diagnosis Date Noted  . Hyperglycemia 07/04/2016  . Musculoskeletal neck pain 01/05/2016  . CAD (coronary artery disease) 01/04/2016  . History of ST elevation myocardial infarction (STEMI) 01/04/2016  . NEPHROLITHIASIS, HX OF 10/22/2008  . CHOLELITHIASIS 11/08/2007  . Essential hypertension 10/17/2007  . HYPERLIPIDEMIA 12/13/2006  . ELEVATED PROSTATE SPECIFIC ANTIGEN 09/16/2006    Current Outpatient Prescriptions on File Prior to Visit  Medication Sig Dispense Refill  . amLODipine (NORVASC) 5 MG tablet Take 1 tablet (5 mg total) by mouth daily. 90 tablet 3  . aspirin 81 MG tablet Take 81 mg by mouth daily.    Marland Kitchen atorvastatin (LIPITOR) 80 MG tablet Take 1 tablet (80 mg total) by mouth daily at 6 PM. 30 tablet 12  . clopidogrel (PLAVIX) 75 MG tablet Take 75 mg by mouth daily.    . fish oil-omega-3 fatty  acids 1000 MG capsule Take 1 g by mouth every other day.     . losartan (COZAAR) 100 MG tablet Take 1 tablet (100 mg total) by mouth daily. 90 tablet 3  . metoprolol tartrate (LOPRESSOR) 25 MG tablet Take 0.5 tablets (12.5 mg total) by mouth 2 (two) times daily. 30 tablet 12  . Multiple Vitamin (MULTIVITAMIN) tablet Take 1 tablet by mouth daily.    . nitroGLYCERIN (NITROSTAT) 0.4 MG SL tablet Place 1 tablet (0.4 mg total) under the tongue every 5 (five) minutes x 3 doses as needed for chest pain. 25 tablet 1   No current facility-administered medications on file prior to visit.     Past Medical History:  Diagnosis Date  . Acute MI, inferolateral wall, initial episode of care ([place]) 07/30/2015  . Coronary artery disease   . Elevated PSA    Dr Barnie Del  . Gilbert's syndrome   . History of heat stroke 2009  . History of nephrolithiasis   . Hyperlipidemia    NMR 06/2009: LDL 37(1062/694), HDL 40, TG 127. Framingham Study LDL goal =< 130   . Hypertension   . Osteoarthritis of left hip     Past Surgical History:  Procedure Laterality Date  . CARDIAC CATHETERIZATION    . CARDIAC CATHETERIZATION N/A 07/30/2015   Procedure: Left Heart Cath and Coronary Angiography;  Surgeon:  Sherren Mocha, MD;  Location: [facility];  Service: Cardiovascular;  Laterality: N/A;  . CARDIAC CATHETERIZATION N/A 07/30/2015   Procedure: Coronary Stent Intervention;  Surgeon: Sherren Mocha, MD;  Location: [facility];  Service: Cardiovascular;  Laterality: N/A;  Distal RCA- Promus 3.50x16  . COLONOSCOPY  2010   negative  . LITHOTRIPSY  2009   Dr.Duckett, [place]   . TOTAL HIP ARTHROPLASTY  2008   left  . TOTAL HIP ARTHROPLASTY  2004   right    Social History   Social History  . Marital status: Divorced    Spouse name: N/A  . Number of children: N/A  . Years of education: N/A   Social History Main Topics  . Smoking status: Former Smoker    Quit date: 01/26/2000  . Smokeless tobacco: Never  Used     Comment: Age 49  . Alcohol use Yes     Comment: 3 times a week  . Drug use: No  . Sexual activity: Yes    Birth control/ protection: Other-see comments   Other Topics Concern  . Not on file   Social History Narrative  . No narrative on file    Family History  Problem Relation Age of Onset  . Diabetes Mother   . Hypertension Mother   . Heart attack Father 43       smoker  . Prostate cancer Brother 23       CAD; stents @ 39  . Hypertension Brother   . Heart attack Brother 72  . Coronary artery disease Brother        stents late 80s  . Stroke Neg Hx     Review of Systems  Constitutional: Negative for chills and fever.  Respiratory: Negative for cough, shortness of breath and wheezing.   Cardiovascular: Positive for chest pain. Negative for palpitations and leg swelling.  Neurological: Negative for dizziness, light-headedness and headaches.       Objective:   Vitals:   07/05/16 0817  BP: 122/84  Pulse: 68  Resp: 16  Temp: 97.8 F (36.6 C)   Wt Readings from Last 3 Encounters:  07/05/16 223 lb (101.2 kg)  01/05/16 226 lb (102.5 kg)  12/29/15 227 lb 6.4 oz (103.1 kg)   Body mass index is 31.1 kg/m.   Physical Exam    Constitutional: Appears well-developed and well-nourished. No distress.  HENT:  Head: Normocephalic and atraumatic.  Neck: Neck supple. No tracheal deviation present. No thyromegaly present.  No cervical lymphadenopathy Cardiovascular: Normal rate, regular rhythm and normal heart sounds.   No murmur heard. No carotid bruit .  No edema Pulmonary/Chest: Effort normal and breath sounds normal. No respiratory distress. No has no wheezes. No rales.  Skin: Skin is warm and dry. Not diaphoretic.  Psychiatric: Normal mood and affect. Behavior is normal.     Assessment & Plan:    See Problem List for Assessment and Plan of chronic medical problems.   FU in 6 months

## 2016-07-04 NOTE — Assessment & Plan Note (Signed)
Sugars have been elevated - check a1c Stressed healthy diet and regular exercise

## 2016-07-04 NOTE — Patient Instructions (Addendum)

## 2016-07-05 ENCOUNTER — Encounter: Payer: Self-pay | Admitting: Internal Medicine

## 2016-07-05 ENCOUNTER — Other Ambulatory Visit (INDEPENDENT_AMBULATORY_CARE_PROVIDER_SITE_OTHER): Payer: Medicare Other

## 2016-07-05 ENCOUNTER — Ambulatory Visit (INDEPENDENT_AMBULATORY_CARE_PROVIDER_SITE_OTHER): Payer: Medicare Other | Admitting: Internal Medicine

## 2016-07-05 VITALS — BP 122/84 | HR 68 | Temp 97.8°F | Resp 16 | Wt 223.0 lb

## 2016-07-05 DIAGNOSIS — R739 Hyperglycemia, unspecified: Secondary | ICD-10-CM

## 2016-07-05 DIAGNOSIS — I10 Essential (primary) hypertension: Secondary | ICD-10-CM

## 2016-07-05 DIAGNOSIS — I2510 Atherosclerotic heart disease of native coronary artery without angina pectoris: Secondary | ICD-10-CM | POA: Diagnosis not present

## 2016-07-05 DIAGNOSIS — E782 Mixed hyperlipidemia: Secondary | ICD-10-CM | POA: Diagnosis not present

## 2016-07-05 LAB — COMPREHENSIVE METABOLIC PANEL
ALBUMIN: 4.6 g/dL (ref 3.5–5.2)
ALT: 24 U/L (ref 0–53)
AST: 18 U/L (ref 0–37)
Alkaline Phosphatase: 62 U/L (ref 39–117)
BILIRUBIN TOTAL: 1 mg/dL (ref 0.2–1.2)
BUN: 12 mg/dL (ref 6–23)
CO2: 26 mEq/L (ref 19–32)
Calcium: 9.6 mg/dL (ref 8.4–10.5)
Chloride: 104 mEq/L (ref 96–112)
Creatinine, Ser: 0.87 mg/dL (ref 0.40–1.50)
GFR: 93.27 mL/min (ref >60.00)
GLUCOSE: 126 mg/dL — AB (ref 70–99)
POTASSIUM: 4.7 meq/L (ref 3.5–5.1)
Sodium: 138 mEq/L (ref 135–145)
TOTAL PROTEIN: 6.9 g/dL (ref 6.0–8.3)

## 2016-07-05 LAB — HEMOGLOBIN A1C: Hgb A1c MFr Bld: 5.9 % (ref 4.6–6.5)

## 2016-07-05 NOTE — Assessment & Plan Note (Signed)
Having an ache in his left chest daily - has cardio appt next month - did discuss this with him last fall If worsens he will call

## 2016-07-05 NOTE — Assessment & Plan Note (Signed)
BP well controlled Current regimen effective and well tolerated Continue current medications at current doses cmp  

## 2016-07-05 NOTE — Assessment & Plan Note (Signed)
Check lipid panel  Continue daily statin Regular exercise and healthy diet encouraged  

## 2016-07-30 ENCOUNTER — Other Ambulatory Visit: Payer: Self-pay | Admitting: Nurse Practitioner

## 2016-08-13 ENCOUNTER — Other Ambulatory Visit: Payer: Self-pay | Admitting: Cardiovascular Disease

## 2016-08-17 ENCOUNTER — Encounter: Payer: Self-pay | Admitting: Cardiovascular Disease

## 2016-08-17 ENCOUNTER — Ambulatory Visit (INDEPENDENT_AMBULATORY_CARE_PROVIDER_SITE_OTHER): Payer: Medicare Other | Admitting: Cardiovascular Disease

## 2016-08-17 VITALS — BP 120/86 | HR 67 | Ht 71.0 in | Wt 220.8 lb

## 2016-08-17 DIAGNOSIS — I10 Essential (primary) hypertension: Secondary | ICD-10-CM

## 2016-08-17 DIAGNOSIS — I2510 Atherosclerotic heart disease of native coronary artery without angina pectoris: Secondary | ICD-10-CM

## 2016-08-17 DIAGNOSIS — E782 Mixed hyperlipidemia: Secondary | ICD-10-CM | POA: Diagnosis not present

## 2016-08-17 NOTE — Patient Instructions (Signed)
Medication Instructions:  Your physician has recommended you make the following change in your medication:  1. STOP Plavix (clopidogrel)  Labwork: No new orders.   Testing/Procedures: No new orders.   Follow-Up: Your physician wants you to follow-up in: 1 YEAR with Dr Cooper.  You will receive a reminder letter in the mail two months in advance. If you don't receive a letter, please call our office to schedule the follow-up appointment.   Any Other Special Instructions Will Be Listed Below (If Applicable).     If you need a refill on your cardiac medications before your next appointment, please call your pharmacy.   

## 2016-08-17 NOTE — Progress Notes (Signed)
Cardiology Office Note Date:  08/17/2016   ID:  Mike Ray, DOB [birth year], MRN [id number]  PCP:  Binnie Rail, MD  Cardiologist:  Sherren Mocha, MD    Chief Complaint  Patient presents with  . Follow-up     History of Present Illness: Mike Ray is a 66 y.o. male who presents for follow-up of coronary artery disease. The patient initially presented in July 2017 with an acute inferolateral STEMI. He was treated with primary PCI to right coronary artery. There was mild nonobstructive disease noted in the LAD. LVEF was preserved at 55-60%.  The patient is here alone today. He is doing well. He's had a few episodes of very fleeting chest pain only lasting 1-2 seconds. He is active with his work in [employer] and he has no exertional chest pain or pressure. He denies shortness of breath. He is compliant with his medications. He's had no bleeding problems on dual antiplatelet therapy.  Past Medical History:  Diagnosis Date  . Acute MI, inferolateral wall, initial episode of care ([place]) 07/30/2015  . Coronary artery disease   . Elevated PSA    Dr Barnie Del  . Gilbert's syndrome   . History of heat stroke 2009  . History of nephrolithiasis   . Hyperlipidemia    NMR 06/2009: LDL 40(9811/914), HDL 40, TG 127. Framingham Study LDL goal =< 130   . Hypertension   . Osteoarthritis of left hip     Past Surgical History:  Procedure Laterality Date  . CARDIAC CATHETERIZATION    . CARDIAC CATHETERIZATION N/A 07/30/2015   Procedure: Left Heart Cath and Coronary Angiography;  Surgeon: Sherren Mocha, MD;  Location: [place];  Service: Cardiovascular;  Laterality: N/A;  . CARDIAC CATHETERIZATION N/A 07/30/2015   Procedure: Coronary Stent Intervention;  Surgeon: Sherren Mocha, MD;  Location: [place];  Service: Cardiovascular;  Laterality: N/A;  Distal RCA- Promus 3.50x16  . COLONOSCOPY  2010   negative  . LITHOTRIPSY  2009   Dr.Duckett, [place]    . TOTAL HIP ARTHROPLASTY  2008   left  . TOTAL HIP ARTHROPLASTY  2004   right    Current Outpatient Prescriptions  Medication Sig Dispense Refill  . amLODipine (NORVASC) 5 MG tablet Take 1 tablet (5 mg total) by mouth daily. 90 tablet 3  . aspirin 81 MG tablet Take 81 mg by mouth daily.    Marland Kitchen atorvastatin (LIPITOR) 80 MG tablet Take 1 tablet (80 mg total) by mouth daily at 6 PM. 30 tablet 12  . clopidogrel (PLAVIX) 75 MG tablet TAKE ONE TABLET BY MOUTH ONCE DAILY 90 tablet 1  . fish oil-omega-3 fatty acids 1000 MG capsule Take 1 g by mouth every other day.     . losartan (COZAAR) 100 MG tablet TAKE ONE TABLET BY MOUTH ONCE DAILY 90 tablet 1  . metoprolol tartrate (LOPRESSOR) 25 MG tablet Take 0.5 tablets (12.5 mg total) by mouth 2 (two) times daily. 30 tablet 12  . Multiple Vitamin (MULTIVITAMIN) tablet Take 1 tablet by mouth daily.    . nitroGLYCERIN (NITROSTAT) 0.4 MG SL tablet Place 1 tablet (0.4 mg total) under the tongue every 5 (five) minutes x 3 doses as needed for chest pain. 25 tablet 1   No current facility-administered medications for this visit.     Allergies:   Naproxen sodium   Social History:  The patient  reports that he quit smoking about 16 years ago. He has never  used smokeless tobacco. He reports that he drinks alcohol. He reports that he does not use drugs.   Family History:  The patient's family history includes Coronary artery disease in his brother; Diabetes in his mother; Heart attack (age of onset: 50) in his father; Heart attack (age of onset: 36) in his brother; Hypertension in his brother and mother; Prostate cancer (age of onset: 28) in his brother.    ROS:  Please see the history of present illness.   All other systems are reviewed and negative.    PHYSICAL EXAM: VS:  BP 120/86   Pulse 67   Ht 5\' 11"  (1.803 m)   Wt 220 lb 12.8 oz (100.2 kg)   BMI 30.80 kg/m  , BMI Body mass index is 30.8 kg/m. GEN: Well nourished, well developed, in no acute  distress  HEENT: normal  Neck: no JVD, no masses. No carotid bruits Cardiac: RRR without murmur or gallop                Respiratory:  clear to auscultation bilaterally, normal work of breathing GI: soft, nontender, nondistended, + BS MS: no deformity or atrophy  Ext: no pretibial edema, pedal pulses 2+= bilaterally Skin: warm and dry, no rash Neuro:  Strength and sensation are intact Psych: euthymic mood, full affect  EKG:  EKG is ordered today. The ekg ordered today shows normal sinus rhythm with occasional PVC, age indeterminate inferior infarct  Recent Labs: 01/05/2016: Hemoglobin 15.5; Platelets 240.0; TSH 1.37 07/05/2016: ALT 24; BUN 12; Creatinine, Ser 0.87; Potassium 4.7; Sodium 138   Lipid Panel     Component Value Date/Time   CHOL 91 01/05/2016 0843   TRIG 141.0 01/05/2016 0843   HDL 36.10 (L) 01/05/2016 0843   CHOLHDL 3 01/05/2016 0843   VLDL 28.2 01/05/2016 0843   LDLCALC 27 01/05/2016 0843   LDLDIRECT 117.0 07/10/2014 0916      Wt Readings from Last 3 Encounters:  08/17/16 220 lb 12.8 oz (100.2 kg)  07/05/16 223 lb (101.2 kg)  01/05/16 226 lb (102.5 kg)     Cardiac Studies Reviewed: Coronary Stent Intervention Left Heart Cath and Coronary Angiography 07/30/15   Prox Cx to Mid Cx lesion, 25% stenosed.  Mid LAD lesion, 30% stenosed.  1st Diag lesion, 35% stenosed.  There is mild left ventricular systolic dysfunction.  Dist RCA lesion, 100% stenosed. Post intervention, there is a 0% residual stenosis.  1. Acute inferolateral STEMI secondary to total occlusion of the distal RCA, treated successfully with primary PCI using a drug-eluting stent  2. Mild nonobstructive stenosis of the LAD and left circumflex  3. Mild segmental contraction abnormality left ventricle with well-preserved overall LVEF  Recommendations: Post MI medical therapy. Aspirin and brilinta for at least 12 months. If hospital course is uncomplicated, patient could be transferred  out of the ICU tomorrow and discharged home in 48 hours.   Transthoracic Echocardiography 07/31/15 Study Conclusions  - Left ventricle: The cavity size was normal. Systolic function was  normal. The estimated ejection fraction was in the range of 55%  to 60%. Wall motion was normal; there were no regional wall  motion abnormalities. Left ventricular diastolic function  parameters were normal. - Aortic valve: There was trivial regurgitation. - Left atrium: The atrium was mildly dilated. - Atrial septum: No defect or patent foramen ovale was identified  ASSESSMENT AND PLAN: 1.  Coronary artery disease, native vessel, without angina. Very fleeting pains noted but last only 1-2 seconds. His medical program  is reviewed. He is advised to continue on lifelong aspirin and atorvastatin. He will continue on a beta blocker. Per guideline recommendations he can discontinue clopidogrel now greater than 12 months out from his MI.  2. Hypertension: BP well-controlled on current Rx. Continue metoprolol and losartan.  3. Hyperlipidemia: lipids at goal with total cholesterol 91 and LDL 27. Continue high-dose atorvastatin.  Current medicines are reviewed with the patient today.  The patient does not have concerns regarding medicines.  Labs/ tests ordered today include:  No orders of the defined types were placed in this encounter.   Disposition:   FU one year  Signed, Sherren Mocha, MD  08/17/2016 8:39 AM    [facility] [address], [place], [place]  [address] Phone: [phone]; Fax: [phone]

## 2016-08-23 ENCOUNTER — Telehealth: Payer: Self-pay | Admitting: Internal Medicine

## 2016-08-23 MED ORDER — METOPROLOL TARTRATE 25 MG PO TABS: 12.5000 mg | ORAL_TABLET | Freq: Two times a day (BID) | ORAL | 5 refills | Status: DC

## 2016-08-23 NOTE — Telephone Encounter (Signed)
Pt needs a refill of metoprolol tartrate (LOPRESSOR) 25 MG tablet Last CPE 01/05/2016   [facility] [address] in [place]

## 2016-08-23 NOTE — Telephone Encounter (Signed)
Refill sent to [facility].../lmb 

## 2016-09-01 ENCOUNTER — Telehealth: Payer: Self-pay | Admitting: Internal Medicine

## 2016-09-01 MED ORDER — ATORVASTATIN CALCIUM 80 MG PO TABS: 80.0000 mg | ORAL_TABLET | Freq: Every day | ORAL | 5 refills | Status: DC

## 2016-09-01 NOTE — Telephone Encounter (Signed)
Pt called in and needs refill of his atorvastatin (LIPITOR) 80 MG tablet [[id number]]    [facility] on file

## 2016-09-01 NOTE — Telephone Encounter (Signed)
Reviewed chart pt is up-to-date sent refills to [facility]../lmb  

## 2016-11-16 DIAGNOSIS — Z23 Encounter for immunization: Secondary | ICD-10-CM | POA: Diagnosis not present

## 2016-12-30 DIAGNOSIS — H2513 Age-related nuclear cataract, bilateral: Secondary | ICD-10-CM | POA: Diagnosis not present

## 2016-12-30 DIAGNOSIS — H04123 Dry eye syndrome of bilateral lacrimal glands: Secondary | ICD-10-CM | POA: Diagnosis not present

## 2017-01-07 NOTE — Progress Notes (Signed)
Subjective:   Mike Ray is a 66 y.o. male who presents for Medicare Annual/Subsequent preventive examination.  Review of Systems:  No ROS.  Medicare Wellness Visit. Additional risk factors are reflected in the social history.  Cardiac Risk Factors include: advanced age (>53men, >60 women);male gender;hypertension;dyslipidemia Sleep patterns:  gets up 1 times nightly to void. Sleeps 6-7 hours nightly   Home Safety/Smoke Alarms: Feels safe in home. Smoke alarms in place.  Living environment; residence and Firearm Safety: 2-story house, no firearms. Lives with wife, no needs for DME, good support system Seat Belt Safety/Bike Helmet: Wears seat belt.     Objective:    Vitals: BP 124/86   Pulse 67   Temp 97.7 F (36.5 C) (Oral)   Resp 16   Ht 5\' 11"  (1.803 m)   Wt 229 lb (103.9 kg)   SpO2 98%   BMI 31.94 kg/m   Body mass index is 31.94 kg/m.  Advanced Directives 01/10/2017 07/30/2015  Does Patient Have a Medical Advance Directive? Yes Yes  Type of Paramedic of [place];Living will [place];Living will  Does patient want to make changes to medical advance directive? - No - Patient declined  Copy of [place] in Chart? No - copy requested No - copy requested    Tobacco Social History   Tobacco Use  Smoking Status Former Smoker  . Last attempt to quit: 01/26/2000  . Years since quitting: 16.9  Smokeless Tobacco Never Used  Tobacco Comment   Age 12     Counseling given: Not Answered Comment: Age 43  Past Medical History:  Diagnosis Date  . Acute MI, inferolateral wall, initial episode of care ([place]) 07/30/2015  . Coronary artery disease   . Elevated PSA    Dr Barnie Del  . Gilbert's syndrome   . History of heat stroke 2009  . History of nephrolithiasis   . Hyperlipidemia    NMR 06/2009: LDL 24(0973/532), HDL 40, TG 127. Framingham Study LDL goal =< 130   . Hypertension   . Osteoarthritis of left hip     Past Surgical History:  Procedure Laterality Date  . CARDIAC CATHETERIZATION    . CARDIAC CATHETERIZATION N/A 07/30/2015   Procedure: Left Heart Cath and Coronary Angiography;  Surgeon: Sherren Mocha, MD;  Location: [facility];  Service: Cardiovascular;  Laterality: N/A;  . CARDIAC CATHETERIZATION N/A 07/30/2015   Procedure: Coronary Stent Intervention;  Surgeon: Sherren Mocha, MD;  Location: [facility];  Service: Cardiovascular;  Laterality: N/A;  Distal RCA- Promus 3.50x16  . COLONOSCOPY  2010   negative  . LITHOTRIPSY  2009   Dr.Duckett, [place]   . TOTAL HIP ARTHROPLASTY  2008   left  . TOTAL HIP ARTHROPLASTY  2004   right   Family History  Problem Relation Age of Onset  . Diabetes Mother   . Hypertension Mother   . Heart attack Father 59       smoker  . Prostate cancer Brother 7       CAD; stents @ 66  . Hypertension Brother   . Heart attack Brother 67  . Coronary artery disease Brother        stents late 83s  . Stroke Neg Hx    Social History   Socioeconomic History  . Marital status: Divorced    Spouse name: None  . Number of children: None  . Years of education: None  . Highest education level:  None  Social Needs  . Financial resource strain: None  . Food insecurity - worry: None  . Food insecurity - inability: None  . Transportation needs - medical: None  . Transportation needs - non-medical: None  Occupational History  . None  Tobacco Use  . Smoking status: Former Smoker    Last attempt to quit: 01/26/2000    Years since quitting: 16.9  . Smokeless tobacco: Never Used  . Tobacco comment: Age 3  Substance and Sexual Activity  . Alcohol use: Yes    Comment: 3 times a week  . Drug use: No  . Sexual activity: Yes    Birth control/protection: Other-see comments  Other Topics Concern  . None  Social History Narrative  . None    Outpatient Encounter Medications as of 01/10/2017  Medication Sig  . aspirin 81 MG tablet Take 81 mg  by mouth daily.  Marland Kitchen atorvastatin (LIPITOR) 80 MG tablet Take 1 tablet (80 mg total) by mouth daily at 6 PM.  . fish oil-omega-3 fatty acids 1000 MG capsule Take 1 g by mouth every other day.   . losartan (COZAAR) 100 MG tablet TAKE ONE TABLET BY MOUTH ONCE DAILY  . metoprolol tartrate (LOPRESSOR) 25 MG tablet Take 0.5 tablets (12.5 mg total) by mouth 2 (two) times daily.  . Multiple Vitamin (MULTIVITAMIN) tablet Take 1 tablet by mouth every other day.   . nitroGLYCERIN (NITROSTAT) 0.4 MG SL tablet Place 1 tablet (0.4 mg total) under the tongue every 5 (five) minutes x 3 doses as needed for chest pain.  Marland Kitchen amLODipine (NORVASC) 5 MG tablet Take 1 tablet (5 mg total) by mouth daily. (Patient not taking: Reported on 01/10/2017)   No facility-administered encounter medications on file as of 01/10/2017.     Activities of Daily Living In your present state of health, do you have any difficulty performing the following activities: 01/10/2017  Hearing? N  Vision? N  Difficulty concentrating or making decisions? N  Walking or climbing stairs? N  Dressing or bathing? N  Doing errands, shopping? N  Preparing Food and eating ? N  Using the Toilet? N  In the past six months, have you accidently leaked urine? N  Do you have problems with loss of bowel control? N  Managing your Medications? N  Managing your Finances? N  Housekeeping or managing your Housekeeping? N  Some recent data might be hidden    Patient Care Team: Binnie Rail, MD as PCP - General (Internal Medicine) Sherren Mocha, MD as Consulting Physician (Cardiology)   Assessment:   This is a routine wellness examination for Mike Ray. Physical assessment deferred to PCP.   Exercise Activities and Dietary recommendations Current Exercise Habits: Home exercise routine;Structured exercise class, Type of exercise: walking(swimming), Time (Minutes): 40, Frequency (Times/Week): 3, Weekly Exercise (Minutes/Week): 120, Intensity: Moderate,  Exercise limited by: None identified  Diet (meal preparation, eat out, water intake, caffeinated beverages, dairy products, fruits and vegetables): in general, a "healthy" diet  , well balanced  Reviewed heart healthy and diabetic diet, encouraged patient to increase daily water intake.  Goals    . Patient Stated     I would like to lose weight with a goal of 205 pounds. I will do this by continuing to swim, and monitor the amount of sugar and carbohydrates in my diet.        Fall Risk Fall Risk  01/10/2017 01/10/2017 01/05/2016  Falls in the past year? No No No  Depression Screen PHQ 2/9 Scores 01/10/2017 01/10/2017 01/05/2016  PHQ - 2 Score 0 0 0    Cognitive Function       Ad8 score reviewed for issues:  Issues making decisions: no  Less interest in hobbies / activities: no  Repeats questions, stories (family complaining): no  Trouble using ordinary gadgets (microwave, computer, phone):no  Forgets the month or year: no  Mismanaging finances: no  Remembering appts: no  Daily problems with thinking and/or memory: no Ad8 score is= 0  Immunization History  Administered Date(s) Administered  . Influenza-Unspecified 10/26/2015, 11/08/2016  . Pneumococcal Conjugate-13 01/05/2016  . Pneumococcal Polysaccharide-23 01/10/2017  . Tdap 05/29/2012  . Zoster 05/01/2012   Screening Tests Health Maintenance  Topic Date Due  . PNA vac Low Risk Adult (2 of 2 - PPSV23) 01/04/2017  . COLONOSCOPY  01/10/2020  . TETANUS/TDAP  05/30/2022  . INFLUENZA VACCINE  Completed  . Hepatitis C Screening  Completed    Plan:  Continue doing brain stimulating activities (puzzles, reading, adult coloring books, staying active) to keep memory sharp.   Continue to eat heart healthy diet (full of fruits, vegetables, whole grains, lean protein, water--limit salt, fat, and sugar intake) and increase physical activity as tolerated.  I have personally reviewed and noted the following in  the patient's chart:   . Medical and social history . Use of alcohol, tobacco or illicit drugs  . Current medications and supplements . Functional ability and status . Nutritional status . Physical activity . Advanced directives . List of other physicians . Vitals . Screenings to include cognitive, depression, and falls . Referrals and appointments  In addition, I have reviewed and discussed with patient certain preventive protocols, quality metrics, and best practice recommendations. A written personalized care plan for preventive services as well as general preventive health recommendations were provided to patient.     Michiel Cowboy, RN  01/10/2017  Medical screening examination/treatment/procedure(s) were performed by non-physician practitioner and as supervising physician I was immediately available for consultation/collaboration. I agree with above. Binnie Rail, MD

## 2017-01-08 NOTE — Progress Notes (Signed)
Subjective:    Patient ID: Mike Ray, male    DOB: 08-Sep-1950, 66 y.o.   MRN: [id number]  HPI The patient is here for follow up.  CAD, Hypertension: He is taking his medication daily. He is compliant with a low sodium diet.  He denies chest pain, palpitations, edema, shortness of breath with activity or at rest and regular headaches. He is not exercising regularly right now, but typically does - he is not been exercising regularly over the past month.  He will restart regular exercise next month.  He does monitor his blood pressure at home.    Hyperlipidemia: He is taking his medication daily. He is compliant with a low fat/cholesterol diet. He is not currently exercising regularly. He denies myalgias.   Prediabetes:  He has been less compliant with a low sugar/carbohydrate diet.  He is not exercising regularly.  Cold symptoms for 4 weeks:  He has had a lot of phlegm in his throat and has been coughing over the past 4 weeks.  He has three episodes a day of cough lasting about one minute.  He is not taking anything.  He otherwise feels well.  He was concerned that his symptoms lasted approximately 1 month.  He denies any fevers, chills, wheezing or sinus pain.  He has some shortness of breath at night, but only because he cannot breathe through his nose.  Medications and allergies reviewed with patient and updated if appropriate.  Patient Active Problem List   Diagnosis Date Noted  . Prediabetes 07/04/2016  . Musculoskeletal neck pain 01/05/2016  . CAD (coronary artery disease) 01/04/2016  . History of ST elevation myocardial infarction (STEMI) 01/04/2016  . NEPHROLITHIASIS, HX OF 10/22/2008  . CHOLELITHIASIS 11/08/2007  . Essential hypertension 10/17/2007  . HYPERLIPIDEMIA 12/13/2006  . ELEVATED PROSTATE SPECIFIC ANTIGEN 09/16/2006    Current Outpatient Medications on File Prior to Visit  Medication Sig Dispense Refill  . aspirin 81 MG tablet Take 81 mg by mouth daily.      Marland Kitchen atorvastatin (LIPITOR) 80 MG tablet Take 1 tablet (80 mg total) by mouth daily at 6 PM. 30 tablet 5  . fish oil-omega-3 fatty acids 1000 MG capsule Take 1 g by mouth every other day.     . losartan (COZAAR) 100 MG tablet TAKE ONE TABLET BY MOUTH ONCE DAILY 90 tablet 1  . metoprolol tartrate (LOPRESSOR) 25 MG tablet Take 0.5 tablets (12.5 mg total) by mouth 2 (two) times daily. 30 tablet 5  . Multiple Vitamin (MULTIVITAMIN) tablet Take 1 tablet by mouth every other day.     . nitroGLYCERIN (NITROSTAT) 0.4 MG SL tablet Place 1 tablet (0.4 mg total) under the tongue every 5 (five) minutes x 3 doses as needed for chest pain. 25 tablet 1  . amLODipine (NORVASC) 5 MG tablet Take 1 tablet (5 mg total) by mouth daily. (Patient not taking: Reported on 01/10/2017) 90 tablet 3   No current facility-administered medications on file prior to visit.     Past Medical History:  Diagnosis Date  . Acute MI, inferolateral wall, initial episode of care ([place]) 07/30/2015  . Coronary artery disease   . Elevated PSA    Dr Barnie Del  . Gilbert's syndrome   . History of heat stroke 2009  . History of nephrolithiasis   . Hyperlipidemia    NMR 06/2009: LDL 78(2956/213), HDL 40, TG 127. Framingham Study LDL goal =< 130   . Hypertension   . Osteoarthritis of left hip  Past Surgical History:  Procedure Laterality Date  . CARDIAC CATHETERIZATION    . CARDIAC CATHETERIZATION N/A 07/30/2015   Procedure: Left Heart Cath and Coronary Angiography;  Surgeon: Sherren Mocha, MD;  Location: [facility];  Service: Cardiovascular;  Laterality: N/A;  . CARDIAC CATHETERIZATION N/A 07/30/2015   Procedure: Coronary Stent Intervention;  Surgeon: Sherren Mocha, MD;  Location: [facility] [facility];  Service: Cardiovascular;  Laterality: N/A;  Distal RCA- Promus 3.50x16  . COLONOSCOPY  2010   negative  . LITHOTRIPSY  2009   Dr.Duckett, [place]   . TOTAL HIP ARTHROPLASTY  2008   left  . TOTAL HIP ARTHROPLASTY  2004    right    Social History   Socioeconomic History  . Marital status: Divorced    Spouse name: None  . Number of children: None  . Years of education: None  . Highest education level: None  Social Needs  . Financial resource strain: None  . Food insecurity - worry: None  . Food insecurity - inability: None  . Transportation needs - medical: None  . Transportation needs - non-medical: None  Occupational History  . None  Tobacco Use  . Smoking status: Former Smoker    Last attempt to quit: 01/26/2000    Years since quitting: 16.9  . Smokeless tobacco: Never Used  . Tobacco comment: Age 73  Substance and Sexual Activity  . Alcohol use: Yes    Comment: 3 times a week  . Drug use: No  . Sexual activity: Yes    Birth control/protection: Other-see comments  Other Topics Concern  . None  Social History Narrative  . None    Family History  Problem Relation Age of Onset  . Diabetes Mother   . Hypertension Mother   . Heart attack Father 50       smoker  . Prostate cancer Brother 22       CAD; stents @ 46  . Hypertension Brother   . Heart attack Brother 57  . Coronary artery disease Brother        stents late 95s  . Stroke Neg Hx     Review of Systems  Constitutional: Negative for chills and fever.  HENT: Positive for congestion (only first in the morning) and postnasal drip. Negative for ear pain, sinus pressure, sinus pain and sore throat.   Respiratory: Positive for cough (in spurts) and shortness of breath (when sleeping - can't breathe through nose). Negative for wheezing.   Cardiovascular: Negative for chest pain, palpitations and leg swelling.  Neurological: Negative for light-headedness and headaches.       Objective:   Vitals:   01/10/17 0831  BP: 124/86  Pulse: 67  Resp: 16  Temp: 97.7 F (36.5 C)  SpO2: 98%   Wt Readings from Last 3 Encounters:  01/10/17 229 lb (103.9 kg)  08/17/16 220 lb 12.8 oz (100.2 kg)  07/05/16 223 lb (101.2 kg)   Body mass  index is 31.94 kg/m.   Physical Exam    Constitutional: Appears well-developed and well-nourished. No distress.  HENT:  Head: Normocephalic and atraumatic.  Neck: Neck supple. No tracheal deviation present. No thyromegaly present.  Normal bilateral ear canals and tympanic membranes.  No oropharynx erythema.  No cervical lymphadenopathy Cardiovascular: Normal rate, regular rhythm and normal heart sounds.   No murmur heard. No carotid bruit .  No edema Pulmonary/Chest: Effort normal and breath sounds normal. No respiratory distress. No has no wheezes. No rales.  Skin: Skin is warm and dry. Not diaphoretic.  Psychiatric: Normal mood and affect. Behavior is normal.      Assessment & Plan:    See Problem List for Assessment and Plan of chronic medical problems.

## 2017-01-08 NOTE — Patient Instructions (Addendum)
Have blood work done today  Follow up in 6 months  Continue doing brain stimulating activities (puzzles, reading, adult coloring books, staying active) to keep memory sharp.   Continue to eat heart healthy diet (full of fruits, vegetables, whole grains, lean protein, water--limit salt, fat, and sugar intake) and increase physical activity as tolerated.   [name] , Thank you for taking time to come for your Medicare Wellness Visit. I appreciate your ongoing commitment to your health goals. Please review the following plan we discussed and let me know if I can assist you in the future.   These are the goals we discussed: Goals    . Patient Stated     I would like to lose weight with a goal of 205 pounds. I will do this by continuing to swim, and monitor the amount of sugar and carbohydrates in my diet.        This is a list of the screening recommended for you and due dates:  Health Maintenance  Topic Date Due  . Pneumonia vaccines (2 of 2 - PPSV23) 01/04/2017  . Colon Cancer Screening  01/10/2020  . Tetanus Vaccine  05/30/2022  . Flu Shot  Completed  .  Hepatitis C: One time screening is recommended by Center for Disease Control  (CDC) for  adults born from [birth year] through 1965.   Completed

## 2017-01-10 ENCOUNTER — Other Ambulatory Visit (INDEPENDENT_AMBULATORY_CARE_PROVIDER_SITE_OTHER): Payer: Medicare Other

## 2017-01-10 ENCOUNTER — Ambulatory Visit (INDEPENDENT_AMBULATORY_CARE_PROVIDER_SITE_OTHER): Payer: Medicare Other | Admitting: Internal Medicine

## 2017-01-10 ENCOUNTER — Encounter: Payer: Self-pay | Admitting: Internal Medicine

## 2017-01-10 VITALS — BP 124/86 | HR 67 | Temp 97.7°F | Resp 16 | Ht 71.0 in | Wt 229.0 lb

## 2017-01-10 DIAGNOSIS — E782 Mixed hyperlipidemia: Secondary | ICD-10-CM

## 2017-01-10 DIAGNOSIS — Z23 Encounter for immunization: Secondary | ICD-10-CM

## 2017-01-10 DIAGNOSIS — I2510 Atherosclerotic heart disease of native coronary artery without angina pectoris: Secondary | ICD-10-CM | POA: Diagnosis not present

## 2017-01-10 DIAGNOSIS — R7303 Prediabetes: Secondary | ICD-10-CM

## 2017-01-10 DIAGNOSIS — Z125 Encounter for screening for malignant neoplasm of prostate: Secondary | ICD-10-CM

## 2017-01-10 DIAGNOSIS — R972 Elevated prostate specific antigen [PSA]: Secondary | ICD-10-CM | POA: Diagnosis not present

## 2017-01-10 DIAGNOSIS — I10 Essential (primary) hypertension: Secondary | ICD-10-CM | POA: Diagnosis not present

## 2017-01-10 LAB — CBC WITH DIFFERENTIAL/PLATELET
Basophils Absolute: 0 10*3/uL (ref 0.0–0.1)
Basophils Relative: 0.7 % (ref 0.0–3.0)
EOS ABS: 0.2 10*3/uL (ref 0.0–0.7)
Eosinophils Relative: 3.5 % (ref 0.0–5.0)
HCT: 46.7 % (ref 39.0–52.0)
HEMOGLOBIN: 15.9 g/dL (ref 13.0–17.0)
Lymphocytes Relative: 34.9 % (ref 12.0–46.0)
Lymphs Abs: 1.9 10*3/uL (ref 0.7–4.0)
MCHC: 34 g/dL (ref 30.0–36.0)
MCV: 95.1 fl (ref 78.0–100.0)
MONO ABS: 0.4 10*3/uL (ref 0.1–1.0)
Monocytes Relative: 8.4 % (ref 3.0–12.0)
Neutro Abs: 2.8 10*3/uL (ref 1.4–7.7)
Neutrophils Relative %: 52.5 % (ref 43.0–77.0)
Platelets: 239 10*3/uL (ref 150.0–400.0)
RBC: 4.9 Mil/uL (ref 4.22–5.81)
RDW: 13 % (ref 11.5–15.5)
WBC: 5.4 10*3/uL (ref 4.0–10.5)

## 2017-01-10 LAB — LIPID PANEL
Cholesterol: 89 mg/dL (ref 0–200)
HDL: 37.7 mg/dL — AB (ref >39.00)
LDL Cholesterol: 24 mg/dL (ref 0–99)
NonHDL: 51.51
TRIGLYCERIDES: 140 mg/dL (ref 0.0–149.0)
Total CHOL/HDL Ratio: 2
VLDL: 28 mg/dL (ref 0.0–40.0)

## 2017-01-10 LAB — COMPREHENSIVE METABOLIC PANEL
ALBUMIN: 4.7 g/dL (ref 3.5–5.2)
ALT: 29 U/L (ref 0–53)
AST: 22 U/L (ref 0–37)
Alkaline Phosphatase: 57 U/L (ref 39–117)
BILIRUBIN TOTAL: 1.5 mg/dL — AB (ref 0.2–1.2)
BUN: 18 mg/dL (ref 6–23)
CALCIUM: 9.1 mg/dL (ref 8.4–10.5)
CO2: 26 meq/L (ref 19–32)
CREATININE: 0.76 mg/dL (ref 0.40–1.50)
Chloride: 105 mEq/L (ref 96–112)
GFR: 108.85 mL/min (ref >60.00)
Glucose, Bld: 103 mg/dL — ABNORMAL HIGH (ref 70–99)
Potassium: 4.5 mEq/L (ref 3.5–5.1)
Sodium: 139 mEq/L (ref 135–145)
Total Protein: 7.2 g/dL (ref 6.0–8.3)

## 2017-01-10 LAB — HEMOGLOBIN A1C: Hgb A1c MFr Bld: 6.3 % (ref 4.6–6.5)

## 2017-01-10 LAB — PSA, MEDICARE: PSA: 2.7 ng/mL (ref 0.10–4.00)

## 2017-01-10 LAB — TSH: TSH: 1.35 u[IU]/mL (ref 0.35–4.50)

## 2017-01-10 NOTE — Assessment & Plan Note (Addendum)
Check lipid panel, TSH, CMP Continue daily statin Regular exercise and healthy diet encouraged  

## 2017-01-10 NOTE — Assessment & Plan Note (Signed)
BP well controlled Current regimen effective and well tolerated Continue current medications at current doses cmp  

## 2017-01-10 NOTE — Assessment & Plan Note (Signed)
Check a1c Low sugar / carb diet Stressed regular exercise   

## 2017-01-10 NOTE — Assessment & Plan Note (Signed)
Has had an elevated PSA in the past-recheck today for screening for prostate cancer Brother had prostate cancer

## 2017-01-10 NOTE — Assessment & Plan Note (Addendum)
No chest pain, palpitations Continue current medications CMP, CBC

## 2017-01-12 ENCOUNTER — Encounter: Payer: Self-pay | Admitting: Internal Medicine

## 2017-02-15 ENCOUNTER — Telehealth: Payer: Self-pay | Admitting: Cardiovascular Disease

## 2017-02-15 NOTE — Telephone Encounter (Signed)
Pt c/o medication issue:  1. Name of Medication: losartan (COZAAR) 100 MG tablet  2. How are you currently taking this medication (dosage and times per day)? TAKE ONE TABLET BY MOUTH ONCE DAILY  3. Are you having a reaction (difficulty breathing--STAT)? no  4. What is your medication issue? Seen on tv that it causes cancer

## 2017-02-15 NOTE — Telephone Encounter (Signed)
Informed patient that there was a recall on products from a certain manufacturer and that his pharmacy should have called him if there was a recall on his Losartan. Instructed him to call and confirm with his pharmacy if he'd like to be extra cautious and to call back if a med change is necessary. He was grateful for assistance.

## 2017-02-21 ENCOUNTER — Other Ambulatory Visit: Payer: Self-pay | Admitting: Internal Medicine

## 2017-02-21 ENCOUNTER — Other Ambulatory Visit: Payer: Self-pay | Admitting: Nurse Practitioner

## 2017-03-04 ENCOUNTER — Other Ambulatory Visit: Payer: Self-pay | Admitting: Internal Medicine

## 2017-03-30 DIAGNOSIS — Z08 Encounter for follow-up examination after completed treatment for malignant neoplasm: Secondary | ICD-10-CM | POA: Diagnosis not present

## 2017-03-30 DIAGNOSIS — Z85828 Personal history of other malignant neoplasm of skin: Secondary | ICD-10-CM | POA: Diagnosis not present

## 2017-03-30 DIAGNOSIS — L570 Actinic keratosis: Secondary | ICD-10-CM | POA: Diagnosis not present

## 2017-07-09 NOTE — Progress Notes (Signed)
Subjective:    Patient ID: Mike Ray, male    DOB: 04-07-1950, 67 y.o.   MRN: [id number]  HPI The patient is here for follow up.  CAD, Hypertension: He is taking his medication daily. He is compliant with a low sodium diet.  He denies chest pain, palpitations, edema, shortness of breath and regular headaches. He is exercising regularly.  He does not monitor his blood pressure at home.    Hyperlipidemia: He is taking his medication daily. He is compliant with a low fat/cholesterol diet. He is exercising regularly. He denies myalgias.   Prediabetes:  He is compliant with a low sugar/carbohydrate diet.  He is exercising regularly.   Medications and allergies reviewed with patient and updated if appropriate.  Patient Active Problem List   Diagnosis Date Noted  . Prediabetes 07/04/2016  . CAD (coronary artery disease) 01/04/2016  . History of ST elevation myocardial infarction (STEMI) 01/04/2016  . NEPHROLITHIASIS, HX OF 10/22/2008  . CHOLELITHIASIS 11/08/2007  . Essential hypertension 10/17/2007  . HYPERLIPIDEMIA 12/13/2006  . ELEVATED PROSTATE SPECIFIC ANTIGEN 09/16/2006    Current Outpatient Medications on File Prior to Visit  Medication Sig Dispense Refill  . amLODipine (NORVASC) 5 MG tablet Take 1 tablet (5 mg total) by mouth daily. 90 tablet 3  . aspirin 81 MG tablet Take 81 mg by mouth daily.    Marland Kitchen atorvastatin (LIPITOR) 80 MG tablet TAKE 1 TABLET(80 MG) BY MOUTH DAILY AT 6 PM 30 tablet 5  . fish oil-omega-3 fatty acids 1000 MG capsule Take 1 g by mouth every other day.     . losartan (COZAAR) 100 MG tablet Take 1 tablet (100 mg total) by mouth daily. 90 tablet 1  . metoprolol tartrate (LOPRESSOR) 25 MG tablet TAKE 1/2 (ONE-HALF) TABLET BY MOUTH TWICE DAILY 30 tablet 5  . Multiple Vitamin (MULTIVITAMIN) tablet Take 1 tablet by mouth every other day.     . nitroGLYCERIN (NITROSTAT) 0.4 MG SL tablet Place 1 tablet (0.4 mg total) under the tongue every 5 (five) minutes  x 3 doses as needed for chest pain. 25 tablet 1   No current facility-administered medications on file prior to visit.     Past Medical History:  Diagnosis Date  . Acute MI, inferolateral wall, initial episode of care ([place]) 07/30/2015  . Coronary artery disease   . Elevated PSA    Dr Barnie Del  . Gilbert's syndrome   . History of heat stroke 2009  . History of nephrolithiasis   . Hyperlipidemia    NMR 06/2009: LDL 63(7858/850), HDL 40, TG 127. Framingham Study LDL goal =< 130   . Hypertension   . Osteoarthritis of left hip     Past Surgical History:  Procedure Laterality Date  . CARDIAC CATHETERIZATION    . CARDIAC CATHETERIZATION N/A 07/30/2015   Procedure: Left Heart Cath and Coronary Angiography;  Surgeon: Sherren Mocha, MD;  Location: [facility];  Service: Cardiovascular;  Laterality: N/A;  . CARDIAC CATHETERIZATION N/A 07/30/2015   Procedure: Coronary Stent Intervention;  Surgeon: Sherren Mocha, MD;  Location: [facility];  Service: Cardiovascular;  Laterality: N/A;  Distal RCA- Promus 3.50x16  . COLONOSCOPY  2010   negative  . LITHOTRIPSY  2009   Dr.Duckett, [place]   . TOTAL HIP ARTHROPLASTY  2008   left  . TOTAL HIP ARTHROPLASTY  2004   right    Social History   Socioeconomic History  . Marital status: Divorced  Spouse name: Not on file  . Number of children: Not on file  . Years of education: Not on file  . Highest education level: Not on file  Occupational History  . Not on file  Social Needs  . Financial resource strain: Not on file  . Food insecurity:    Worry: Not on file    Inability: Not on file  . Transportation needs:    Medical: Not on file    Non-medical: Not on file  Tobacco Use  . Smoking status: Former Smoker    Last attempt to quit: 01/26/2000    Years since quitting: 17.4  . Smokeless tobacco: Never Used  . Tobacco comment: Age 58  Substance and Sexual Activity  . Alcohol use: Yes    Comment: 3 times a week  . Drug  use: No  . Sexual activity: Yes    Birth control/protection: Other-see comments  Lifestyle  . Physical activity:    Days per week: Not on file    Minutes per session: Not on file  . Stress: Not on file  Relationships  . Social connections:    Talks on phone: Not on file    Gets together: Not on file    Attends religious service: Not on file    Active member of club or organization: Not on file    Attends meetings of clubs or organizations: Not on file    Relationship status: Not on file  Other Topics Concern  . Not on file  Social History Narrative  . Not on file    Family History  Problem Relation Age of Onset  . Diabetes Mother   . Hypertension Mother   . Heart attack Father 42       smoker  . Prostate cancer Brother 80       CAD; stents @ 43  . Hypertension Brother   . Heart attack Brother 79  . Coronary artery disease Brother        stents late 61s  . Stroke Neg Hx     Review of Systems  Constitutional: Negative for chills and fever.  Respiratory: Negative for cough, shortness of breath and wheezing.   Cardiovascular: Positive for chest pain (pain lasts 2-3 sec - atpyical chest pain). Negative for palpitations and leg swelling.  Neurological: Negative for dizziness, light-headedness and headaches.       Objective:   Vitals:   07/11/17 0750  BP: 128/82  Pulse: 69  Resp: 16  Temp: 98 F (36.7 C)  SpO2: 98%   BP Readings from Last 3 Encounters:  07/11/17 128/82  01/10/17 124/86  08/17/16 120/86   Wt Readings from Last 3 Encounters:  07/11/17 226 lb (102.5 kg)  01/10/17 229 lb (103.9 kg)  08/17/16 220 lb 12.8 oz (100.2 kg)   Body mass index is 31.52 kg/m.   Physical Exam    Constitutional: Appears well-developed and well-nourished. No distress.  HENT:  Head: Normocephalic and atraumatic.  Neck: Neck supple. No tracheal deviation present. No thyromegaly present.  No cervical lymphadenopathy Cardiovascular: Normal rate, regular rhythm and  normal heart sounds.   No murmur heard. No carotid bruit .  No edema Pulmonary/Chest: Effort normal and breath sounds normal. No respiratory distress. No has no wheezes. No rales.  Skin: Skin is warm and dry. Not diaphoretic.  Psychiatric: Normal mood and affect. Behavior is normal.      Assessment & Plan:    See Problem List for Assessment and Plan of chronic  medical problems.

## 2017-07-09 NOTE — Patient Instructions (Addendum)
  Test(s) ordered today. Your results will be released to MyChart (or called to you) after review, usually within 72hours after test completion. If any changes need to be made, you will be notified at that same time.  Medications reviewed and updated.  No changes recommended at this time.    Please followup in 6 months   

## 2017-07-11 ENCOUNTER — Encounter: Payer: Self-pay | Admitting: Internal Medicine

## 2017-07-11 ENCOUNTER — Ambulatory Visit (INDEPENDENT_AMBULATORY_CARE_PROVIDER_SITE_OTHER): Payer: Medicare Other | Admitting: Internal Medicine

## 2017-07-11 ENCOUNTER — Other Ambulatory Visit (INDEPENDENT_AMBULATORY_CARE_PROVIDER_SITE_OTHER): Payer: Medicare Other

## 2017-07-11 ENCOUNTER — Ambulatory Visit: Payer: Medicare Other | Admitting: Internal Medicine

## 2017-07-11 VITALS — BP 128/82 | HR 69 | Temp 98.0°F | Resp 16 | Ht 71.0 in | Wt 226.0 lb

## 2017-07-11 DIAGNOSIS — R7303 Prediabetes: Secondary | ICD-10-CM | POA: Diagnosis not present

## 2017-07-11 DIAGNOSIS — I2510 Atherosclerotic heart disease of native coronary artery without angina pectoris: Secondary | ICD-10-CM

## 2017-07-11 DIAGNOSIS — I10 Essential (primary) hypertension: Secondary | ICD-10-CM

## 2017-07-11 DIAGNOSIS — E782 Mixed hyperlipidemia: Secondary | ICD-10-CM

## 2017-07-11 LAB — LIPID PANEL
Cholesterol: 97 mg/dL (ref 0–200)
HDL: 38.9 mg/dL — ABNORMAL LOW (ref >39.00)
LDL Cholesterol: 32 mg/dL (ref 0–99)
NonHDL: 57.65
Total CHOL/HDL Ratio: 2
Triglycerides: 130 mg/dL (ref 0.0–149.0)
VLDL: 26 mg/dL (ref 0.0–40.0)

## 2017-07-11 LAB — COMPREHENSIVE METABOLIC PANEL
ALK PHOS: 56 U/L (ref 39–117)
ALT: 25 U/L (ref 0–53)
AST: 20 U/L (ref 0–37)
Albumin: 4.6 g/dL (ref 3.5–5.2)
BILIRUBIN TOTAL: 0.9 mg/dL (ref 0.2–1.2)
BUN: 16 mg/dL (ref 6–23)
CALCIUM: 9.2 mg/dL (ref 8.4–10.5)
CO2: 26 meq/L (ref 19–32)
Chloride: 105 mEq/L (ref 96–112)
Creatinine, Ser: 0.85 mg/dL (ref 0.40–1.50)
GFR: 95.51 mL/min (ref >60.00)
Glucose, Bld: 124 mg/dL — ABNORMAL HIGH (ref 70–99)
Potassium: 4 mEq/L (ref 3.5–5.1)
Sodium: 141 mEq/L (ref 135–145)
Total Protein: 7 g/dL (ref 6.0–8.3)

## 2017-07-11 LAB — HEMOGLOBIN A1C: Hgb A1c MFr Bld: 6.1 % (ref 4.6–6.5)

## 2017-07-11 NOTE — Assessment & Plan Note (Signed)
BP well controlled Current regimen effective and well tolerated Continue current medications at current doses cmp  

## 2017-07-11 NOTE — Assessment & Plan Note (Signed)
Check a1c Low sugar / carb diet Stressed regular exercise   

## 2017-07-11 NOTE — Assessment & Plan Note (Signed)
No concerning chest pain, sob, palps Continue current medications

## 2017-07-11 NOTE — Assessment & Plan Note (Signed)
Check lipid panel  Continue daily statin Regular exercise and healthy diet encouraged  

## 2017-08-24 ENCOUNTER — Other Ambulatory Visit: Payer: Self-pay | Admitting: Nurse Practitioner

## 2017-08-24 ENCOUNTER — Other Ambulatory Visit: Payer: Self-pay | Admitting: Internal Medicine

## 2017-08-27 ENCOUNTER — Other Ambulatory Visit: Payer: Self-pay | Admitting: Internal Medicine

## 2017-09-01 DIAGNOSIS — R1031 Right lower quadrant pain: Secondary | ICD-10-CM | POA: Diagnosis not present

## 2017-09-01 DIAGNOSIS — Z87891 Personal history of nicotine dependence: Secondary | ICD-10-CM | POA: Diagnosis not present

## 2017-09-05 ENCOUNTER — Encounter: Payer: Self-pay | Admitting: Internal Medicine

## 2017-09-05 DIAGNOSIS — K4090 Unilateral inguinal hernia, without obstruction or gangrene, not specified as recurrent: Secondary | ICD-10-CM

## 2017-09-08 ENCOUNTER — Encounter: Payer: Self-pay | Admitting: Internal Medicine

## 2017-09-12 ENCOUNTER — Ambulatory Visit: Payer: Medicare Other | Admitting: Internal Medicine

## 2017-09-12 ENCOUNTER — Ambulatory Visit (INDEPENDENT_AMBULATORY_CARE_PROVIDER_SITE_OTHER): Payer: Medicare Other | Admitting: Internal Medicine

## 2017-09-12 ENCOUNTER — Encounter: Payer: Self-pay | Admitting: Internal Medicine

## 2017-09-12 VITALS — BP 140/88 | HR 70 | Temp 97.5°F | Resp 16 | Ht 71.0 in | Wt 224.0 lb

## 2017-09-12 DIAGNOSIS — I2510 Atherosclerotic heart disease of native coronary artery without angina pectoris: Secondary | ICD-10-CM | POA: Diagnosis not present

## 2017-09-12 DIAGNOSIS — K4090 Unilateral inguinal hernia, without obstruction or gangrene, not specified as recurrent: Secondary | ICD-10-CM | POA: Insufficient documentation

## 2017-09-12 NOTE — Telephone Encounter (Signed)
appt scheduled with Dr Quay Burow today.

## 2017-09-12 NOTE — Assessment & Plan Note (Signed)
Symptoms and exam convincing for right inguinal hernia Hs discomfort but no pain Hernia reducible Referral to urology  Call if symptoms worsen prior to seeing urology.

## 2017-09-12 NOTE — Progress Notes (Signed)
Subjective:    Patient ID: Mike Ray, male    DOB: 05-Feb-1950, 67 y.o.   MRN: [id number]  HPI The patient is here for an acute visit.  He started having symptoms two weeks ago.  He had to have a bowel movement and people were waiting for him and he rushed and had to strain.  That is when the pain in his right groin started.  It was swollen.  He was in a lot of pain that day.  All day he had pain in the right testicle and groin.  The next couple of days if he had pain he would sit down and it would go away.  That week he was at the beach.  Thursday he had sudden onset of pain in the right groin and sitting down relieved the pain.  It occurred again - he had significant pain and sat down with improvement.     He has had only discomfort since then.  If he feels it coming he just sits down and that feeling will go away.  He definitely has swelling in the right groin/suprapubic region - it is better than when it first started.    Medications and allergies reviewed with patient and updated if appropriate.  Patient Active Problem List   Diagnosis Date Noted  . Prediabetes 07/04/2016  . CAD (coronary artery disease) 01/04/2016  . History of ST elevation myocardial infarction (STEMI) 01/04/2016  . NEPHROLITHIASIS, HX OF 10/22/2008  . CHOLELITHIASIS 11/08/2007  . Essential hypertension 10/17/2007  . HYPERLIPIDEMIA 12/13/2006  . ELEVATED PROSTATE SPECIFIC ANTIGEN 09/16/2006    Current Outpatient Medications on File Prior to Visit  Medication Sig Dispense Refill  . amLODipine (NORVASC) 5 MG tablet Take 1 tablet (5 mg total) by mouth daily. 90 tablet 3  . aspirin 81 MG tablet Take 81 mg by mouth daily.    Marland Kitchen atorvastatin (LIPITOR) 80 MG tablet TAKE 1 TABLET(80 MG) BY MOUTH DAILY AT 6 PM 90 tablet 2  . fish oil-omega-3 fatty acids 1000 MG capsule Take 1 g by mouth every other day.     . losartan (COZAAR) 100 MG tablet Take 1 tablet (100 mg total) by mouth daily. Make overdue appt for  anymore refills. 1st attempt. [phone] 30 tablet 0  . metoprolol tartrate (LOPRESSOR) 25 MG tablet TAKE 1/2 (ONE-HALF) TABLET BY MOUTH TWICE DAILY 90 tablet 2  . Multiple Vitamin (MULTIVITAMIN) tablet Take 1 tablet by mouth every other day.     . nitroGLYCERIN (NITROSTAT) 0.4 MG SL tablet Place 1 tablet (0.4 mg total) under the tongue every 5 (five) minutes x 3 doses as needed for chest pain. 25 tablet 1   No current facility-administered medications on file prior to visit.     Past Medical History:  Diagnosis Date  . Acute MI, inferolateral wall, initial episode of care ([place]) 07/30/2015  . Coronary artery disease   . Elevated PSA    Dr Barnie Del  . Gilbert's syndrome   . History of heat stroke 2009  . History of nephrolithiasis   . Hyperlipidemia    NMR 06/2009: LDL 45(0388/828), HDL 40, TG 127. Framingham Study LDL goal =< 130   . Hypertension   . Osteoarthritis of left hip     Past Surgical History:  Procedure Laterality Date  . CARDIAC CATHETERIZATION    . CARDIAC CATHETERIZATION N/A 07/30/2015   Procedure: Left Heart Cath and Coronary Angiography;  Surgeon: Sherren Mocha, MD;  Location: [facility];  Service: Cardiovascular;  Laterality: N/A;  . CARDIAC CATHETERIZATION N/A 07/30/2015   Procedure: Coronary Stent Intervention;  Surgeon: Sherren Mocha, MD;  Location: [facility];  Service: Cardiovascular;  Laterality: N/A;  Distal RCA- Promus 3.50x16  . COLONOSCOPY  2010   negative  . LITHOTRIPSY  2009   Dr.Duckett, [place]   . TOTAL HIP ARTHROPLASTY  2008   left  . TOTAL HIP ARTHROPLASTY  2004   right    Social History   Socioeconomic History  . Marital status: Divorced    Spouse name: Not on file  . Number of children: Not on file  . Years of education: Not on file  . Highest education level: Not on file  Occupational History  . Not on file  Social Needs  . Financial resource strain: Not on file  . Food insecurity:    Worry: Not on file     Inability: Not on file  . Transportation needs:    Medical: Not on file    Non-medical: Not on file  Tobacco Use  . Smoking status: Former Smoker    Last attempt to quit: 01/26/2000    Years since quitting: 17.6  . Smokeless tobacco: Never Used  . Tobacco comment: Age 55  Substance and Sexual Activity  . Alcohol use: Yes    Comment: 3 times a week  . Drug use: No  . Sexual activity: Yes    Birth control/protection: Other-see comments  Lifestyle  . Physical activity:    Days per week: Not on file    Minutes per session: Not on file  . Stress: Not on file  Relationships  . Social connections:    Talks on phone: Not on file    Gets together: Not on file    Attends religious service: Not on file    Active member of club or organization: Not on file    Attends meetings of clubs or organizations: Not on file    Relationship status: Not on file  Other Topics Concern  . Not on file  Social History Narrative  . Not on file    Family History  Problem Relation Age of Onset  . Diabetes Mother   . Hypertension Mother   . Heart attack Father 28       smoker  . Prostate cancer Brother 78       CAD; stents @ 90  . Hypertension Brother   . Heart attack Brother 51  . Coronary artery disease Brother        stents late 25s  . Stroke Neg Hx     Review of Systems  Gastrointestinal: Negative for abdominal pain and constipation.  Genitourinary: Positive for testicular pain (related to hernia). Negative for difficulty urinating.       Right suprapubic swelling  Skin: Negative for color change.       Objective:   Vitals:   09/12/17 1302  BP: 140/88  Pulse: 70  Resp: 16  Temp: (!) 97.5 F (36.4 C)  SpO2: 97%   BP Readings from Last 3 Encounters:  09/12/17 140/88  07/11/17 128/82  01/10/17 124/86   Wt Readings from Last 3 Encounters:  09/12/17 224 lb (101.6 kg)  07/11/17 226 lb (102.5 kg)  01/10/17 229 lb (103.9 kg)   Body mass index is 31.24 kg/m.   Physical Exam    Constitutional: He appears well-developed and well-nourished. No distress.  Abdominal: Soft. He exhibits no distension. There is no tenderness. A hernia (  right suprapubic region swelling - discomfort is mild, no pain, reducible) is present.  Skin: Skin is warm and dry. He is not diaphoretic. No erythema.           Assessment & Plan:    See Problem List for Assessment and Plan of chronic medical problems.

## 2017-09-12 NOTE — Patient Instructions (Addendum)
A referral was ordered for surgery.     Inguinal Hernia, Adult An inguinal hernia is when fat or the intestines push through the area where the leg meets the lower abdomen (groin) and create a rounded lump (bulge). This condition develops over time. There are three types of inguinal hernias. These types include:  Hernias that can be pushed back into the belly (are reducible).  Hernias that are not reducible (are incarcerated).  Hernias that are not reducible and lose their blood supply (are strangulated). This type of hernia requires emergency surgery.  What are the causes? This condition is caused by having a weak spot in the muscles or tissue. This weakness lets the hernia poke through. This condition can be triggered by:  Suddenly straining the muscles of the lower abdomen.  Lifting heavy objects.  Straining to have a bowel movement. Difficult bowel movements (constipation) can lead to this.  Coughing.  What increases the risk? This condition is more likely to develop in:  Men.  Pregnant women.  People who: ? Are overweight. ? Work in jobs that require long periods of standing or heavy lifting. ? Have had an inguinal hernia before. ? Smoke or have lung disease. These factors can lead to long-lasting (chronic) coughing.  What are the signs or symptoms? Symptoms can depend on the size of the hernia. Often, a small inguinal hernia has no symptoms. Symptoms of a larger hernia include:  A lump in the groin. This is easier to see when the person is standing. It might not be visible when he or she is lying down.  Pain or burning in the groin. This occurs especially when lifting, straining, or coughing.  A dull ache or a feeling of pressure in the groin.  A lump in the scrotum in men.  Symptoms of a strangulated inguinal hernia can include:  A bulge in the groin that is very painful and tender to the touch.  A bulge that turns red or purple.  Fever, nausea, and  vomiting.  The inability to have a bowel movement or to pass gas.  How is this diagnosed? This condition is diagnosed with a medical history and physical exam. Your health care provider may feel your groin area and ask you to cough. How is this treated? Treatment for this condition varies depending on the size of your hernia and whether you have symptoms. If you do not have symptoms, your health care provider may have you watch your hernia carefully and come in for follow-up visits. If your hernia is larger or if you have symptoms, your treatment will include surgery. Follow these instructions at home: Lifestyle  Drink enough fluid to keep your urine clear or pale yellow.  Eat a diet that includes a lot of fiber. Eat plenty of fruits, vegetables, and whole grains. Talk with your health care provider if you have questions.  Avoid lifting heavy objects.  Avoid standing for long periods of time.  Do not use tobacco products, including cigarettes, chewing tobacco, or e-cigarettes. If you need help quitting, ask your health care provider.  Maintain a healthy weight. General instructions  Do not try to force the hernia back in.  Watch your hernia for any changes in color or size. Let your health care provider know if any changes occur.  Take over-the-counter and prescription medicines only as told by your health care provider.  Keep all follow-up visits as told by your health care provider. This is important. Contact a health care provider  if:  You have a fever.  You have new symptoms.  Your symptoms get worse. Get help right away if:  You have pain in the groin that suddenly gets worse.  A bulge in the groin gets bigger suddenly and does not go down.  You are a man and you have a sudden pain in the scrotum, or the size of your scrotum suddenly changes.  A bulge in the groin area becomes red or purple and is painful to the touch.  You have nausea or vomiting that does not go  away.  You feel your heart beating a lot more quickly than normal.  You cannot have a bowel movement or pass gas. This information is not intended to replace advice given to you by your health care provider. Make sure you discuss any questions you have with your health care provider. Document Released: 05/30/2008 Document Revised: 06/19/2015 Document Reviewed: 11/21/2013 Elsevier Interactive Patient Education  2018 [employer].

## 2017-09-16 ENCOUNTER — Encounter: Payer: Self-pay | Admitting: Internal Medicine

## 2017-09-19 ENCOUNTER — Ambulatory Visit: Payer: Self-pay | Admitting: Surgery

## 2017-09-19 DIAGNOSIS — K4090 Unilateral inguinal hernia, without obstruction or gangrene, not specified as recurrent: Secondary | ICD-10-CM | POA: Diagnosis not present

## 2017-09-19 NOTE — H&P (Signed)
[name] Documented: 09/19/2017 2:10 PM Location: [facility] Patient #: [id number] DOB: Feb 19, 1950 Married / Language: Undefined / Race: White Male  History of Present Illness Marcello Moores A. Ethie Curless MD; 09/19/2017 2:45 PM) Patient words: hernia   Pt sent at the request of Dr Quay Burow for right groin bulge times 2 weeks. He had a strenous bowel movement and had right groin pain. He applied ice and it improved. The bulge pops in and out in right groin. No associated nause or BM issues. It is less painful now and rest helps it.  The patient is a 67 year old male.   Past Surgical History Sharyn Lull R. Brooks, CMA; 09/19/2017 2:11 PM) Coronary Artery Bypass Graft Hip Surgery Bilateral.  Diagnostic Studies History Sharyn Lull R. Brooks, CMA; 09/19/2017 2:11 PM) Colonoscopy 5-10 years ago  Allergies Sharyn Lull R. Brooks, CMA; 09/19/2017 2:11 PM) No Known Drug Allergies [09/19/2017]:  Medication History Sharyn Lull R. Brooks, CMA; 09/19/2017 2:13 PM) Atorvastatin Calcium (80MG  Tablet, Oral) Active. Losartan Potassium (100MG  Tablet, Oral) Active. Metoprolol Tartrate (25MG  Tablet, Oral) Active. AmLODIPine Besylate (5MG  Tablet, Oral) Active. Aspirin (81MG  Tablet, Oral) Active. Fish Oil (Oral) Specific strength unknown - Active. Multi-Vitamin (Oral) Active. Nitroglycerin (0.4MG  Tab Sublingual, Sublingual) Active. Medications Reconciled  Social History Sharyn Lull R. Brooks, CMA; 09/19/2017 2:11 PM) Alcohol use Moderate alcohol use. Caffeine use Coffee. No drug use Tobacco use Former smoker.  Family History Sharyn Lull R. Brooks, CMA; 09/19/2017 2:11 PM) Heart Disease Brother, Father.  Other Problems Sharyn Lull R. Brooks, CMA; 09/19/2017 2:11 PM) Arthritis Back Pain Congestive Heart Failure High blood pressure Inguinal Hernia Kidney Stone     Review of Systems (Malcome Ambrocio A. Jia Dottavio MD; 09/19/2017 2:46 PM) General Not Present- Appetite Loss, Chills,  Fatigue, Fever, Night Sweats, Weight Gain and Weight Loss. Skin Not Present- Change in Wart/Mole, Dryness, Hives, Jaundice, New Lesions, Non-Healing Wounds, Rash and Ulcer. HEENT Not Present- Earache, Hearing Loss, Hoarseness, Nose Bleed, Oral Ulcers, Ringing in the Ears, Seasonal Allergies, Sinus Pain, Sore Throat, Visual Disturbances, Wears glasses/contact lenses and Yellow Eyes. Respiratory Not Present- Bloody sputum, Chronic Cough, Difficulty Breathing, Snoring and Wheezing. Breast Not Present- Breast Mass, Breast Pain, Nipple Discharge and Skin Changes. Cardiovascular Not Present- Chest Pain, Difficulty Breathing Lying Down, Leg Cramps, Palpitations, Rapid Heart Rate, Shortness of Breath and Swelling of Extremities. Gastrointestinal Present- Change in Bowel Habits. Not Present- Abdominal Pain, Bloating, Bloody Stool, Chronic diarrhea, Constipation, Difficulty Swallowing, Excessive gas, Gets full quickly at meals, Hemorrhoids, Indigestion, Nausea, Rectal Pain and Vomiting. Male Genitourinary Not Present- Blood in Urine, Change in Urinary Stream, Frequency, Impotence, Nocturia, Painful Urination, Urgency and Urine Leakage. All other systems negative  Vitals [employer] R. Brooks CMA; 09/19/2017 2:11 PM) 09/19/2017 2:10 PM Weight: 227.25 lb Height: 71in Body Surface Area: 2.23 m Body Mass Index: 31.69 kg/m  BP: 138/86 (Sitting, Left Arm, Standard)      Physical Exam (Cambreigh Dearing A. Samie Reasons MD; 09/19/2017 2:45 PM)  General Mental Status-Alert. General Appearance-Consistent with stated age. Hydration-Well hydrated. Voice-Normal.  Head and Neck Head-normocephalic, atraumatic with no lesions or palpable masses. Trachea-midline. Thyroid Gland Characteristics - normal size and consistency.  Eye Eyeball - Bilateral-Extraocular movements intact. Sclera/Conjunctiva - Bilateral-No scleral icterus.  Chest and Lung Exam Chest and lung exam reveals -quiet, even and easy  respiratory effort with no use of accessory muscles and on auscultation, normal breath sounds, no adventitious sounds and normal vocal resonance. Inspection Chest Wall - Normal. Back - normal.  Cardiovascular Cardiovascular examination reveals -normal heart sounds, regular rate and rhythm  with no murmurs and normal pedal pulses bilaterally.  Abdomen Note: reducible right inguinal hernia no left inguinal hernia testes normal  Neurologic Neurologic evaluation reveals -alert and oriented x 3 with no impairment of recent or remote memory. Mental Status-Normal.  Lymphatic Head & Neck  General Head & Neck Lymphatics: Bilateral - Description - Normal. Femoral & Inguinal  Generalized Femoral & Inguinal Lymphatics: Bilateral - Description - Normal. Tenderness - Non Tender.    Assessment & Plan (Khylin Gutridge A. Jahanna Raether MD; 09/19/2017 2:45 PM)  RIGHT INGUINAL HERNIA (K40.90) Impression: The risk of hernia repair include bleeding, infection, organ injury, bowel injury, bladder injury, nerve injury recurrent hernia, blood clots, worsening of underlying condition, chronic pain, mesh use, open surgery, death, and the need for other operattions. Pt agrees to proceed  Current Plans You are being scheduled for surgery- Our schedulers will call you.  You should hear from our office's scheduling department within 5 working days about the location, date, and time of surgery. We try to make accommodations for patient's preferences in scheduling surgery, but sometimes the OR schedule or the surgeon's schedule prevents [place] from making those accommodations.  If you have not heard from our office [phone]) in 5 working days, call the office and ask for your surgeon's nurse.  If you have other questions about your diagnosis, plan, or surgery, call the office and ask for your surgeon's nurse.  Pt Education - Pamphlet Given - Hernia Surgery: discussed with patient and provided information. The  anatomy & physiology of the abdominal wall and pelvic floor was discussed. The pathophysiology of hernias in the inguinal and pelvic region was discussed. Natural history risks such as progressive enlargement, pain, incarceration, and strangulation was discussed. Contributors to complications such as smoking, obesity, diabetes, prior surgery, etc were discussed.  I feel the risks of no intervention will lead to serious problems that outweigh the operative risks; therefore, I recommended surgery to reduce and repair the hernia. I explained the open approach. I noted usual use of mesh to patch and/or buttress hernia repair  Risks such as bleeding, infection, abscess, need for further treatment, heart attack, death, and other risks were discussed. I noted a good likelihood this will help address the problem. Goals of post-operative recovery were discussed as well. Possibility that this will not correct all symptoms was explained. I stressed the importance of low-impact activity, aggressive pain control, avoiding constipation, & not pushing through pain to minimize risk of post-operative chronic pain or injury. Possibility of reherniation was discussed. We will work to minimize complications.  An educational handout further explaining the pathology & treatment options was given as well. Questions were answered. The patient expresses understanding & wishes to proceed with surgery.  Pt Education - CCS Mesh education: discussed with patient and provided information.

## 2017-09-19 NOTE — H&P (View-Only) (Signed)
Mike Ray Documented: 09/19/2017 2:10 PM Location: [facility] Patient #: [id number] DOB: July 16, 1950 Married / Language: Undefined / Race: White Male  History of Present Illness Mike Moores A. Alaa Eyerman MD; 09/19/2017 2:45 PM) Patient words: hernia   Pt sent at the request of Dr Quay Burow for right groin bulge times 2 weeks. He had a strenous bowel movement and had right groin pain. He applied ice and it improved. The bulge pops in and out in right groin. No associated nause or BM issues. It is less painful now and rest helps it.  The patient is a 67 year old male.   Past Surgical History Sharyn Lull R. Brooks, CMA; 09/19/2017 2:11 PM) Coronary Artery Bypass Graft Hip Surgery Bilateral.  Diagnostic Studies History Sharyn Lull R. Brooks, CMA; 09/19/2017 2:11 PM) Colonoscopy 5-10 years ago  Allergies Sharyn Lull R. Brooks, CMA; 09/19/2017 2:11 PM) No Known Drug Allergies [09/19/2017]:  Medication History Sharyn Lull R. Brooks, CMA; 09/19/2017 2:13 PM) Atorvastatin Calcium (80MG  Tablet, Oral) Active. Losartan Potassium (100MG  Tablet, Oral) Active. Metoprolol Tartrate (25MG  Tablet, Oral) Active. AmLODIPine Besylate (5MG  Tablet, Oral) Active. Aspirin (81MG  Tablet, Oral) Active. Fish Oil (Oral) Specific strength unknown - Active. Multi-Vitamin (Oral) Active. Nitroglycerin (0.4MG  Tab Sublingual, Sublingual) Active. Medications Reconciled  Social History Sharyn Lull R. Brooks, CMA; 09/19/2017 2:11 PM) Alcohol use Moderate alcohol use. Caffeine use Coffee. No drug use Tobacco use Former smoker.  Family History Sharyn Lull R. Brooks, CMA; 09/19/2017 2:11 PM) Heart Disease Brother, Father.  Other Problems Sharyn Lull R. Brooks, CMA; 09/19/2017 2:11 PM) Arthritis Back Pain Congestive Heart Failure High blood pressure Inguinal Hernia Kidney Stone     Review of Systems (Ferlando Lia A. Malaak Stach MD; 09/19/2017 2:46 PM) General Not Present- Appetite Loss, Chills,  Fatigue, Fever, Night Sweats, Weight Gain and Weight Loss. Skin Not Present- Change in Wart/Mole, Dryness, Hives, Jaundice, New Lesions, Non-Healing Wounds, Rash and Ulcer. HEENT Not Present- Earache, Hearing Loss, Hoarseness, Nose Bleed, Oral Ulcers, Ringing in the Ears, Seasonal Allergies, Sinus Pain, Sore Throat, Visual Disturbances, Wears glasses/contact lenses and Yellow Eyes. Respiratory Not Present- Bloody sputum, Chronic Cough, Difficulty Breathing, Snoring and Wheezing. Breast Not Present- Breast Mass, Breast Pain, Nipple Discharge and Skin Changes. Cardiovascular Not Present- Chest Pain, Difficulty Breathing Lying Down, Leg Cramps, Palpitations, Rapid Heart Rate, Shortness of Breath and Swelling of Extremities. Gastrointestinal Present- Change in Bowel Habits. Not Present- Abdominal Pain, Bloating, Bloody Stool, Chronic diarrhea, Constipation, Difficulty Swallowing, Excessive gas, Gets full quickly at meals, Hemorrhoids, Indigestion, Nausea, Rectal Pain and Vomiting. Male Genitourinary Not Present- Blood in Urine, Change in Urinary Stream, Frequency, Impotence, Nocturia, Painful Urination, Urgency and Urine Leakage. All other systems negative  Vitals [employer] R. Brooks CMA; 09/19/2017 2:11 PM) 09/19/2017 2:10 PM Weight: 227.25 lb Height: 71in Body Surface Area: 2.23 m Body Mass Index: 31.69 kg/m  BP: 138/86 (Sitting, Left Arm, Standard)      Physical Exam (Zeth Buday A. Saburo Luger MD; 09/19/2017 2:45 PM)  General Mental Status-Alert. General Appearance-Consistent with stated age. Hydration-Well hydrated. Voice-Normal.  Head and Neck Head-normocephalic, atraumatic with no lesions or palpable masses. Trachea-midline. Thyroid Gland Characteristics - normal size and consistency.  Eye Eyeball - Bilateral-Extraocular movements intact. Sclera/Conjunctiva - Bilateral-No scleral icterus.  Chest and Lung Exam Chest and lung exam reveals -quiet, even and easy  respiratory effort with no use of accessory muscles and on auscultation, normal breath sounds, no adventitious sounds and normal vocal resonance. Inspection Chest Wall - Normal. Back - normal.  Cardiovascular Cardiovascular examination reveals -normal heart sounds, regular rate and rhythm  with no murmurs and normal pedal pulses bilaterally.  Abdomen Note: reducible right inguinal hernia no left inguinal hernia testes normal  Neurologic Neurologic evaluation reveals -alert and oriented x 3 with no impairment of recent or remote memory. Mental Status-Normal.  Lymphatic Head & Neck  General Head & Neck Lymphatics: Bilateral - Description - Normal. Femoral & Inguinal  Generalized Femoral & Inguinal Lymphatics: Bilateral - Description - Normal. Tenderness - Non Tender.    Assessment & Plan (Brie Eppard A. Jamika Sadek MD; 09/19/2017 2:45 PM)  RIGHT INGUINAL HERNIA (K40.90) Impression: The risk of hernia repair include bleeding, infection, organ injury, bowel injury, bladder injury, nerve injury recurrent hernia, blood clots, worsening of underlying condition, chronic pain, mesh use, open surgery, death, and the need for other operattions. Pt agrees to proceed  Current Plans You are being scheduled for surgery- Our schedulers will call you.  You should hear from our office's scheduling department within 5 working days about the location, date, and time of surgery. We try to make accommodations for patient's preferences in scheduling surgery, but sometimes the OR schedule or the surgeon's schedule prevents [place] from making those accommodations.  If you have not heard from our office [phone]) in 5 working days, call the office and ask for your surgeon's nurse.  If you have other questions about your diagnosis, plan, or surgery, call the office and ask for your surgeon's nurse.  Pt Education - Pamphlet Given - Hernia Surgery: discussed with patient and provided information. The  anatomy & physiology of the abdominal wall and pelvic floor was discussed. The pathophysiology of hernias in the inguinal and pelvic region was discussed. Natural history risks such as progressive enlargement, pain, incarceration, and strangulation was discussed. Contributors to complications such as smoking, obesity, diabetes, prior surgery, etc were discussed.  I feel the risks of no intervention will lead to serious problems that outweigh the operative risks; therefore, I recommended surgery to reduce and repair the hernia. I explained the open approach. I noted usual use of mesh to patch and/or buttress hernia repair  Risks such as bleeding, infection, abscess, need for further treatment, heart attack, death, and other risks were discussed. I noted a good likelihood this will help address the problem. Goals of post-operative recovery were discussed as well. Possibility that this will not correct all symptoms was explained. I stressed the importance of low-impact activity, aggressive pain control, avoiding constipation, & not pushing through pain to minimize risk of post-operative chronic pain or injury. Possibility of reherniation was discussed. We will work to minimize complications.  An educational handout further explaining the pathology & treatment options was given as well. Questions were answered. The patient expresses understanding & wishes to proceed with surgery.  Pt Education - CCS Mesh education: discussed with patient and provided information.

## 2017-09-28 ENCOUNTER — Other Ambulatory Visit: Payer: Self-pay | Admitting: Nurse Practitioner

## 2017-10-05 ENCOUNTER — Ambulatory Visit (INDEPENDENT_AMBULATORY_CARE_PROVIDER_SITE_OTHER): Payer: Medicare Other | Admitting: Cardiovascular Disease

## 2017-10-05 ENCOUNTER — Encounter: Payer: Self-pay | Admitting: Cardiovascular Disease

## 2017-10-05 VITALS — BP 140/86 | HR 66 | Ht 71.0 in | Wt 225.8 lb

## 2017-10-05 DIAGNOSIS — I25119 Atherosclerotic heart disease of native coronary artery with unspecified angina pectoris: Secondary | ICD-10-CM

## 2017-10-05 DIAGNOSIS — I10 Essential (primary) hypertension: Secondary | ICD-10-CM | POA: Diagnosis not present

## 2017-10-05 DIAGNOSIS — E782 Mixed hyperlipidemia: Secondary | ICD-10-CM

## 2017-10-05 MED ORDER — AMLODIPINE BESYLATE 10 MG PO TABS: 10.0000 mg | ORAL_TABLET | Freq: Every day | ORAL | 3 refills | Status: DC

## 2017-10-05 NOTE — Patient Instructions (Signed)
Medication Instructions:  1) INCREASE AMLODIPINE to 10 mg daily  Labwork: None  Testing/Procedures: Your provider has requested that you have a lexiscan myoview. For further information please visit [email or web address]. Please follow instruction sheet, as given.  Follow-Up: Your provider wants you to follow-up in: 1 year with Dr. Burt Knack.  You will receive a reminder letter in the mail two months in advance. If you don't receive a letter, please call our office to schedule the follow-up appointment.    Any Other Special Instructions Will Be Listed Below (If Applicable).     If you need a refill on your cardiac medications before your next appointment, please call your pharmacy.

## 2017-10-05 NOTE — Progress Notes (Signed)
Cardiology Office Note Date:  10/06/2017   ID:  Mike Ray, DOB [birth year], MRN [id number]  PCP:  Binnie Rail, MD  Cardiologist:  Sherren Mocha, MD    Chief Complaint  Patient presents with  . Coronary Artery Disease     History of Present Illness: Mike Ray is a 67 y.o. male who presents for *follow-up of coronary artery disease.  The patient initially presented in 2017 with an acute inferolateral STEMI treated with primary PCI of the right coronary artery.  His LV function was preserved with an ejection fraction of 55 to 60% he was noted to have minor nonobstructive disease elsewhere.  The patient is here alone today.  He has had a slow down physically because of an inguinal hernia.  He otherwise he has not had any exertional symptoms.  He is able to swim without chest pain or shortness of breath.  However, he has had resting chest pain that has occurred ever since his heart attack.  He has fleeting pains that just last a few seconds generally and they are sharp in nature.  They also "catch his breath."  He had an episode of chest discomfort yesterday in the left upper chest that occurred at rest and lasted about 1 hour.  This was more of a pressure-like sensation.  He has been physically active with no exertional chest discomfort or shortness of breath.  He is been compliant with his medications.  He states that his blood pressure generally runs around 140/85-90.  Past Medical History:  Diagnosis Date  . Acute MI, inferolateral wall, initial episode of care ([place]) 07/30/2015  . Coronary artery disease   . Elevated PSA    Dr Barnie Del  . Gilbert's syndrome   . History of heat stroke 2009  . History of nephrolithiasis   . Hyperlipidemia    NMR 06/2009: LDL 84(1660/630), HDL 40, TG 127. Framingham Study LDL goal =< 130   . Hypertension   . Osteoarthritis of left hip     Past Surgical History:  Procedure Laterality Date  . CARDIAC CATHETERIZATION    . CARDIAC  CATHETERIZATION N/A 07/30/2015   Procedure: Left Heart Cath and Coronary Angiography;  Surgeon: Sherren Mocha, MD;  Location: [facility];  Service: Cardiovascular;  Laterality: N/A;  . CARDIAC CATHETERIZATION N/A 07/30/2015   Procedure: Coronary Stent Intervention;  Surgeon: Sherren Mocha, MD;  Location: [facility];  Service: Cardiovascular;  Laterality: N/A;  Distal RCA- Promus 3.50x16  . COLONOSCOPY  2010   negative  . LITHOTRIPSY  2009   Dr.Duckett, [place]   . TOTAL HIP ARTHROPLASTY  2008   left  . TOTAL HIP ARTHROPLASTY  2004   right    Current Outpatient Medications  Medication Sig Dispense Refill  . amLODipine (NORVASC) 10 MG tablet Take 1 tablet (10 mg total) by mouth daily. 90 tablet 3  . aspirin 81 MG tablet Take 81 mg by mouth daily.    Marland Kitchen atorvastatin (LIPITOR) 80 MG tablet TAKE 1 TABLET(80 MG) BY MOUTH DAILY AT 6 PM 90 tablet 2  . fish oil-omega-3 fatty acids 1000 MG capsule Take 1 g by mouth every other day.     . losartan (COZAAR) 100 MG tablet TAKE 1 TABLET BY MOUTH ONCE DAILY *PATIENT  IS  OVERDUE  FOR  APPOINTMENT* 30 tablet 0  . metoprolol tartrate (LOPRESSOR) 25 MG tablet TAKE 1/2 (ONE-HALF) TABLET BY MOUTH TWICE DAILY 90 tablet 2  . Multiple Vitamin (  MULTIVITAMIN) tablet Take 1 tablet by mouth every other day.     . nitroGLYCERIN (NITROSTAT) 0.4 MG SL tablet Place 1 tablet (0.4 mg total) under the tongue every 5 (five) minutes x 3 doses as needed for chest pain. 25 tablet 1   No current facility-administered medications for this visit.     Allergies:   Naproxen sodium   Social History:  The patient  reports that he quit smoking about 17 years ago. He has never used smokeless tobacco. He reports that he drinks alcohol. He reports that he does not use drugs.   Family History:  The patient's family history includes Coronary artery disease in his brother; Diabetes in his mother; Heart attack (age of onset: 75) in his father; Heart attack (age of onset:  4) in his brother; Hypertension in his brother and mother; Prostate cancer (age of onset: 70) in his brother.    ROS:  Please see the history of present illness.  Otherwise, review of systems is positive for palpitations.  All other systems are reviewed and negative.    PHYSICAL EXAM: VS:  BP 140/86   Pulse 66   Ht 5\' 11"  (1.803 m)   Wt 225 lb 12.8 oz (102.4 kg)   SpO2 97%   BMI 31.49 kg/m  , BMI Body mass index is 31.49 kg/m. GEN: Well nourished, well developed, in no acute distress  HEENT: normal  Neck: no JVD, no masses. No carotid bruits Cardiac: RRR without murmur or gallop                Respiratory:  clear to auscultation bilaterally, normal work of breathing GI: soft, nontender, nondistended, + BS MS: no deformity or atrophy  Ext: no pretibial edema, pedal pulses 2+= bilaterally Skin: warm and dry, no rash Neuro:  Strength and sensation are intact Psych: euthymic mood, full affect  EKG:  EKG is ordered today. The ekg ordered today shows normal sinus rhythm 66 bpm, age-indeterminate inferior MI, left axis deviation.  Recent Labs: 01/10/2017: Hemoglobin 15.9; Platelets 239.0; TSH 1.35 07/11/2017: ALT 25; BUN 16; Creatinine, Ser 0.85; Potassium 4.0; Sodium 141   Lipid Panel     Component Value Date/Time   CHOL 97 07/11/2017 0811   TRIG 130.0 07/11/2017 0811   HDL 38.90 (L) 07/11/2017 0811   CHOLHDL 2 07/11/2017 0811   VLDL 26.0 07/11/2017 0811   LDLCALC 32 07/11/2017 0811   LDLDIRECT 117.0 07/10/2014 0916      Wt Readings from Last 3 Encounters:  10/05/17 225 lb 12.8 oz (102.4 kg)  09/12/17 224 lb (101.6 kg)  07/11/17 226 lb (102.5 kg)    ASSESSMENT AND PLAN: 1.  CAD, native vessel, with atypical angina: I recommended a pharmacologic nuclear scan prior to surgery to better evaluate his chest pain symptoms.  My suspicion is that his chest pain is noncardiac based on his description.  I reviewed his cardiac catheterization films from 2017 today and he was  treated with a drug-eluting stent in the distal right coronary artery at the site of occlusion when he presented with an acute inferior STEMI.  He did have diffuse nonobstructive disease throughout the LAD and left circumflex without any evidence of high-grade stenosis at that time.  His medical program will be continued and this includes aspirin for antiplatelet therapy, high intensity statin drug, a beta-blocker, and an ARB.  2.  Hypertension, suboptimal control: Medications are reviewed and amlodipine will be increased to 10 mg daily.  I discussed potential side  effects including possibility of leg edema.  No other changes are made today.  3.  Mixed hyperlipidemia: The patient is treated with atorvastatin 80 mg.  Lipids from June 2019 reviewed with a total cholesterol 97, HDL 39, LDL 32.  Current medicines are reviewed with the patient today.  The patient does not have concerns regarding medicines.  Labs/ tests ordered today include:   Orders Placed This Encounter  Procedures  . MYOCARDIAL PERFUSION IMAGING  . EKG 12-Lead    Disposition:   FU one year  Signed, Sherren Mocha, MD  10/06/2017 8:31 AM    [facility] [address], [place], [place]  [address] Phone: [phone]; Fax: [phone]

## 2017-10-06 ENCOUNTER — Encounter: Payer: Self-pay | Admitting: Cardiovascular Disease

## 2017-10-06 ENCOUNTER — Telehealth (HOSPITAL_COMMUNITY): Payer: Self-pay | Admitting: *Deleted

## 2017-10-06 NOTE — Telephone Encounter (Signed)
Left message on voicemail per DPR in reference to upcoming appointment scheduled on 10/11/17 with detailed instructions given per Myocardial Perfusion Study Information Sheet for the test. LM to arrive 15 minutes early, and that it is imperative to arrive on time for appointment to keep from having the test rescheduled. If you need to cancel or reschedule your appointment, please call the office within 24 hours of your appointment. Failure to do so may result in a cancellation of your appointment, and a $50 no show fee. Phone number given for call back for any questions. Piedad Standiford Jacqueline    

## 2017-10-07 ENCOUNTER — Telehealth: Payer: Self-pay

## 2017-10-07 ENCOUNTER — Telehealth: Payer: Self-pay | Admitting: Cardiovascular Disease

## 2017-10-07 MED ORDER — AMLODIPINE BESYLATE 10 MG PO TABS: 5.0000 mg | ORAL_TABLET | Freq: Every day | ORAL | 3 refills | Status: DC

## 2017-10-07 NOTE — Telephone Encounter (Signed)
New message    Patient is following up on amlodipine medication adjusted refill per the previous message from today. Please call to discuss.

## 2017-10-07 NOTE — Telephone Encounter (Signed)
At his OV with Dr. Burt Knack yesterday, [name]' BP was elevated and he was instructed to increase his amlodipine from 5 mg to 10 mg daily.  He now reports he has not been taking his amlodipine for several months. He has "no idea" what happened, but he is nervous to start at a higher dose. Instructed the patient to take 10 mg (1/2 tablet) daily, monitor BP and call with results in a few weeks to see if it needs to be increased at that time. He was grateful for assistance.

## 2017-10-07 NOTE — Telephone Encounter (Signed)
See other 9/13 phone note.

## 2017-10-07 NOTE — Telephone Encounter (Signed)
Mike Ray. [name] called asking about his amlodipine 10 mg. He said he has not taken it in 9 months and had not realized it at the time of his visit on 10/05/17. His BP was high and Dr. Burt Knack wanted him to increase his dose of amlodipine to 10 mg because of this. I told him to call back and speak with you to clarify his dosage. He was taking 5 mg in 2018 and feels that dose worked well for him so he doesn't need the increase. Please advise. Thank you.

## 2017-10-10 ENCOUNTER — Encounter: Payer: Self-pay | Admitting: Internal Medicine

## 2017-10-11 ENCOUNTER — Ambulatory Visit (HOSPITAL_COMMUNITY): Payer: Medicare Other | Attending: Cardiology

## 2017-10-11 ENCOUNTER — Telehealth: Payer: Self-pay

## 2017-10-11 DIAGNOSIS — R9430 Abnormal result of cardiovascular function study, unspecified: Secondary | ICD-10-CM | POA: Diagnosis not present

## 2017-10-11 DIAGNOSIS — I517 Cardiomegaly: Secondary | ICD-10-CM | POA: Insufficient documentation

## 2017-10-11 DIAGNOSIS — I252 Old myocardial infarction: Secondary | ICD-10-CM | POA: Insufficient documentation

## 2017-10-11 DIAGNOSIS — R931 Abnormal findings on diagnostic imaging of heart and coronary circulation: Secondary | ICD-10-CM

## 2017-10-11 DIAGNOSIS — I25119 Atherosclerotic heart disease of native coronary artery with unspecified angina pectoris: Secondary | ICD-10-CM | POA: Diagnosis not present

## 2017-10-11 DIAGNOSIS — I2510 Atherosclerotic heart disease of native coronary artery without angina pectoris: Secondary | ICD-10-CM

## 2017-10-11 LAB — MYOCARDIAL PERFUSION IMAGING
CHL CUP NUCLEAR SDS: 0
CHL CUP NUCLEAR SSS: 20
LHR: 0.37
LV dias vol: 171 mL (ref 62–150)
LV sys vol: 107 mL
NUC STRESS TID: 1.12
Peak HR: 91 {beats}/min
Rest HR: 69 {beats}/min
SRS: 20

## 2017-10-11 MED ORDER — REGADENOSON 0.4 MG/5ML IV SOLN
0.4000 mg | Freq: Once | INTRAVENOUS | Status: AC
  Administered 2017-10-11: 0.4 mg via INTRAVENOUS

## 2017-10-11 MED ORDER — TECHNETIUM TC 99M TETROFOSMIN IV KIT
10.1000 | PACK | Freq: Once | INTRAVENOUS | Status: AC | PRN
  Administered 2017-10-11: 10.1 via INTRAVENOUS
  Filled 2017-10-11: qty 11

## 2017-10-11 MED ORDER — TECHNETIUM TC 99M TETROFOSMIN IV KIT
31.3000 | PACK | Freq: Once | INTRAVENOUS | Status: AC | PRN
  Administered 2017-10-11: 31.3 via INTRAVENOUS
  Filled 2017-10-11: qty 32

## 2017-10-11 NOTE — Telephone Encounter (Signed)
Informed patient of results and verbal understanding expressed.  Echo scheduled 9/19. Patient agrees with treatment plan.

## 2017-10-11 NOTE — Telephone Encounter (Signed)
-----   Message from Sherren Mocha, MD sent at 10/11/2017  2:33 PM EDT ----- Reduced LV function, fixed defects without ischemia noted. Please obtain a 2D echo to better assess LV function. thx

## 2017-10-13 ENCOUNTER — Ambulatory Visit (HOSPITAL_COMMUNITY): Payer: Medicare Other | Attending: Cardiology

## 2017-10-13 ENCOUNTER — Other Ambulatory Visit: Payer: Self-pay

## 2017-10-13 DIAGNOSIS — I10 Essential (primary) hypertension: Secondary | ICD-10-CM | POA: Diagnosis not present

## 2017-10-13 DIAGNOSIS — Z8249 Family history of ischemic heart disease and other diseases of the circulatory system: Secondary | ICD-10-CM | POA: Diagnosis not present

## 2017-10-13 DIAGNOSIS — Z87891 Personal history of nicotine dependence: Secondary | ICD-10-CM | POA: Diagnosis not present

## 2017-10-13 DIAGNOSIS — I252 Old myocardial infarction: Secondary | ICD-10-CM | POA: Diagnosis not present

## 2017-10-13 DIAGNOSIS — R931 Abnormal findings on diagnostic imaging of heart and coronary circulation: Secondary | ICD-10-CM

## 2017-10-13 DIAGNOSIS — I2510 Atherosclerotic heart disease of native coronary artery without angina pectoris: Secondary | ICD-10-CM

## 2017-10-13 DIAGNOSIS — E785 Hyperlipidemia, unspecified: Secondary | ICD-10-CM | POA: Diagnosis not present

## 2017-10-14 ENCOUNTER — Encounter (HOSPITAL_BASED_OUTPATIENT_CLINIC_OR_DEPARTMENT_OTHER): Payer: Self-pay | Admitting: *Deleted

## 2017-10-14 ENCOUNTER — Other Ambulatory Visit: Payer: Self-pay

## 2017-10-14 NOTE — Progress Notes (Signed)
Chart and cardiac testing reviewed with Dr green, OK for [facility].

## 2017-10-17 ENCOUNTER — Encounter (HOSPITAL_BASED_OUTPATIENT_CLINIC_OR_DEPARTMENT_OTHER)
Admission: RE | Admit: 2017-10-17 | Discharge: 2017-10-17 | Disposition: A | Discharge status: Home / Self Care | Payer: Medicare Other | Source: Ambulatory Visit | Attending: Surgery | Admitting: Surgery

## 2017-10-17 DIAGNOSIS — Z87891 Personal history of nicotine dependence: Secondary | ICD-10-CM | POA: Diagnosis not present

## 2017-10-17 DIAGNOSIS — I252 Old myocardial infarction: Secondary | ICD-10-CM | POA: Diagnosis not present

## 2017-10-17 DIAGNOSIS — K4090 Unilateral inguinal hernia, without obstruction or gangrene, not specified as recurrent: Secondary | ICD-10-CM | POA: Diagnosis not present

## 2017-10-17 DIAGNOSIS — I2510 Atherosclerotic heart disease of native coronary artery without angina pectoris: Secondary | ICD-10-CM | POA: Diagnosis not present

## 2017-10-17 DIAGNOSIS — Z7982 Long term (current) use of aspirin: Secondary | ICD-10-CM | POA: Diagnosis not present

## 2017-10-17 DIAGNOSIS — I509 Heart failure, unspecified: Secondary | ICD-10-CM | POA: Diagnosis not present

## 2017-10-17 DIAGNOSIS — I110 Hypertensive heart disease with heart failure: Secondary | ICD-10-CM | POA: Diagnosis not present

## 2017-10-17 DIAGNOSIS — Z79899 Other long term (current) drug therapy: Secondary | ICD-10-CM | POA: Diagnosis not present

## 2017-10-17 LAB — COMPREHENSIVE METABOLIC PANEL
ALBUMIN: 4.2 g/dL (ref 3.5–5.0)
ALT: 31 U/L (ref 0–44)
AST: 26 U/L (ref 15–41)
Alkaline Phosphatase: 62 U/L (ref 38–126)
Anion gap: 12 (ref 5–15)
BUN: 15 mg/dL (ref 8–23)
CHLORIDE: 102 mmol/L (ref 98–111)
CO2: 22 mmol/L (ref 22–32)
CREATININE: 0.77 mg/dL (ref 0.61–1.24)
Calcium: 9 mg/dL (ref 8.9–10.3)
GFR calc Af Amer: >60 mL/min (ref >60)
GFR calc non Af Amer: >60 mL/min (ref >60)
Glucose, Bld: 119 mg/dL — ABNORMAL HIGH (ref 70–99)
Potassium: 4.4 mmol/L (ref 3.5–5.1)
Sodium: 136 mmol/L (ref 135–145)
Total Bilirubin: 1.2 mg/dL (ref 0.3–1.2)
Total Protein: 6.6 g/dL (ref 6.5–8.1)

## 2017-10-17 LAB — CBC WITH DIFFERENTIAL/PLATELET
ABS IMMATURE GRANULOCYTES: 0 10*3/uL (ref 0.0–0.1)
Basophils Absolute: 0 10*3/uL (ref 0.0–0.1)
Basophils Relative: 1 %
Eosinophils Absolute: 0.3 10*3/uL (ref 0.0–0.7)
Eosinophils Relative: 6 %
HEMATOCRIT: 43.7 % (ref 39.0–52.0)
Hemoglobin: 15.2 g/dL (ref 13.0–17.0)
IMMATURE GRANULOCYTES: 1 %
LYMPHS ABS: 1.6 10*3/uL (ref 0.7–4.0)
Lymphocytes Relative: 31 %
MCH: 32.3 pg (ref 26.0–34.0)
MCHC: 34.8 g/dL (ref 30.0–36.0)
MCV: 93 fL (ref 78.0–100.0)
MONOS PCT: 9 %
Monocytes Absolute: 0.5 10*3/uL (ref 0.1–1.0)
NEUTROS ABS: 2.8 10*3/uL (ref 1.7–7.7)
NEUTROS PCT: 52 %
PLATELETS: 193 10*3/uL (ref 150–400)
RBC: 4.7 MIL/uL (ref 4.22–5.81)
RDW: 11.9 % (ref 11.5–15.5)
WBC: 5.2 10*3/uL (ref 4.0–10.5)

## 2017-10-17 NOTE — Progress Notes (Signed)
Pt. Given Ensure drink with instructions to be completed by 0415 am DOS. Pt verbalized understanding. Pt also given hibiclens scrub with instructions to try on small area prior to use as pt. Has never used this product before. Pt verbalized understanding.

## 2017-10-19 ENCOUNTER — Other Ambulatory Visit: Payer: Self-pay

## 2017-10-19 ENCOUNTER — Ambulatory Visit (HOSPITAL_BASED_OUTPATIENT_CLINIC_OR_DEPARTMENT_OTHER)
Admission: RE | Admit: 2017-10-19 | Discharge: 2017-10-19 | Disposition: A | Discharge status: Home / Self Care | Payer: Medicare Other | Source: Ambulatory Visit | Attending: Surgery | Admitting: Surgery

## 2017-10-19 ENCOUNTER — Ambulatory Visit (HOSPITAL_BASED_OUTPATIENT_CLINIC_OR_DEPARTMENT_OTHER): Payer: Medicare Other | Admitting: Anesthesiology

## 2017-10-19 ENCOUNTER — Encounter (HOSPITAL_BASED_OUTPATIENT_CLINIC_OR_DEPARTMENT_OTHER): Payer: Self-pay | Admitting: *Deleted

## 2017-10-19 ENCOUNTER — Encounter (HOSPITAL_BASED_OUTPATIENT_CLINIC_OR_DEPARTMENT_OTHER)
Admission: RE | Disposition: A | Discharge status: Home / Self Care | Payer: Self-pay | Source: Ambulatory Visit | Attending: Surgery

## 2017-10-19 DIAGNOSIS — I110 Hypertensive heart disease with heart failure: Secondary | ICD-10-CM | POA: Insufficient documentation

## 2017-10-19 DIAGNOSIS — I509 Heart failure, unspecified: Secondary | ICD-10-CM | POA: Diagnosis not present

## 2017-10-19 DIAGNOSIS — Z87891 Personal history of nicotine dependence: Secondary | ICD-10-CM | POA: Diagnosis not present

## 2017-10-19 DIAGNOSIS — Z79899 Other long term (current) drug therapy: Secondary | ICD-10-CM | POA: Insufficient documentation

## 2017-10-19 DIAGNOSIS — Z7982 Long term (current) use of aspirin: Secondary | ICD-10-CM | POA: Insufficient documentation

## 2017-10-19 DIAGNOSIS — I2510 Atherosclerotic heart disease of native coronary artery without angina pectoris: Secondary | ICD-10-CM | POA: Insufficient documentation

## 2017-10-19 DIAGNOSIS — K4090 Unilateral inguinal hernia, without obstruction or gangrene, not specified as recurrent: Secondary | ICD-10-CM | POA: Diagnosis not present

## 2017-10-19 DIAGNOSIS — I252 Old myocardial infarction: Secondary | ICD-10-CM | POA: Diagnosis not present

## 2017-10-19 DIAGNOSIS — G8918 Other acute postprocedural pain: Secondary | ICD-10-CM | POA: Diagnosis not present

## 2017-10-19 HISTORY — PX: INSERTION OF MESH: SHX5868

## 2017-10-19 HISTORY — PX: INGUINAL HERNIA REPAIR: SHX194

## 2017-10-19 SURGERY — REPAIR, HERNIA, INGUINAL, ADULT
Anesthesia: General | Site: Abdomen | Laterality: Right

## 2017-10-19 MED ORDER — MIDAZOLAM HCL 5 MG/5ML IJ SOLN
INTRAMUSCULAR | Status: DC | PRN
  Administered 2017-10-19: 2 mg via INTRAVENOUS

## 2017-10-19 MED ORDER — PROPOFOL 10 MG/ML IV BOLUS
INTRAVENOUS | Status: DC | PRN
  Administered 2017-10-19: 150 mg via INTRAVENOUS

## 2017-10-19 MED ORDER — ONDANSETRON HCL 4 MG/2ML IJ SOLN
INTRAMUSCULAR | Status: AC
  Filled 2017-10-19: qty 2

## 2017-10-19 MED ORDER — BUPIVACAINE HCL (PF) 0.5 % IJ SOLN
INTRAMUSCULAR | Status: DC | PRN
  Administered 2017-10-19: 30 mL

## 2017-10-19 MED ORDER — MIDAZOLAM HCL 2 MG/2ML IJ SOLN
INTRAMUSCULAR | Status: AC
  Filled 2017-10-19: qty 2

## 2017-10-19 MED ORDER — CHLORHEXIDINE GLUCONATE CLOTH 2 % EX PADS: 6.0000 | MEDICATED_PAD | Freq: Once | CUTANEOUS | Status: DC

## 2017-10-19 MED ORDER — GABAPENTIN 300 MG PO CAPS
ORAL_CAPSULE | ORAL | Status: AC
  Filled 2017-10-19: qty 1

## 2017-10-19 MED ORDER — OXYCODONE HCL 5 MG PO TABS: 5.0000 mg | ORAL_TABLET | Freq: Four times a day (QID) | ORAL | 0 refills | Status: DC | PRN

## 2017-10-19 MED ORDER — LIDOCAINE HCL (CARDIAC) PF 100 MG/5ML IV SOSY
PREFILLED_SYRINGE | INTRAVENOUS | Status: DC | PRN
  Administered 2017-10-19: 100 mg via INTRAVENOUS

## 2017-10-19 MED ORDER — FENTANYL CITRATE (PF) 100 MCG/2ML IJ SOLN
INTRAMUSCULAR | Status: AC
  Filled 2017-10-19: qty 2

## 2017-10-19 MED ORDER — ACETAMINOPHEN 500 MG PO TABS
ORAL_TABLET | ORAL | Status: AC
  Filled 2017-10-19: qty 2

## 2017-10-19 MED ORDER — FENTANYL CITRATE (PF) 100 MCG/2ML IJ SOLN
INTRAMUSCULAR | Status: DC | PRN
  Administered 2017-10-19: 50 ug via INTRAVENOUS

## 2017-10-19 MED ORDER — BUPIVACAINE HCL (PF) 0.25 % IJ SOLN
INTRAMUSCULAR | Status: AC
  Filled 2017-10-19: qty 30

## 2017-10-19 MED ORDER — PHENYLEPHRINE HCL 10 MG/ML IJ SOLN
INTRAMUSCULAR | Status: DC | PRN
  Administered 2017-10-19 (×7): 80 ug via INTRAVENOUS

## 2017-10-19 MED ORDER — BUPIVACAINE-EPINEPHRINE (PF) 0.25% -1:200000 IJ SOLN
INTRAMUSCULAR | Status: AC
  Filled 2017-10-19: qty 60

## 2017-10-19 MED ORDER — EPHEDRINE 5 MG/ML INJ
INTRAVENOUS | Status: AC
  Filled 2017-10-19: qty 10

## 2017-10-19 MED ORDER — BUPIVACAINE-EPINEPHRINE 0.25% -1:200000 IJ SOLN
INTRAMUSCULAR | Status: DC | PRN
  Administered 2017-10-19: 10 mL

## 2017-10-19 MED ORDER — SCOPOLAMINE 1 MG/3DAYS TD PT72: 1.0000 | MEDICATED_PATCH | Freq: Once | TRANSDERMAL | Status: DC | PRN

## 2017-10-19 MED ORDER — SUGAMMADEX SODIUM 200 MG/2ML IV SOLN
INTRAVENOUS | Status: DC | PRN
  Administered 2017-10-19: 200 mg via INTRAVENOUS

## 2017-10-19 MED ORDER — CEFAZOLIN SODIUM-DEXTROSE 2-3 GM-%(50ML) IV SOLR
INTRAVENOUS | Status: DC | PRN
  Administered 2017-10-19: 2 g via INTRAVENOUS

## 2017-10-19 MED ORDER — GABAPENTIN 300 MG PO CAPS
300.0000 mg | ORAL_CAPSULE | ORAL | Status: AC
  Administered 2017-10-19: 300 mg via ORAL

## 2017-10-19 MED ORDER — ACETAMINOPHEN 500 MG PO TABS
1000.0000 mg | ORAL_TABLET | ORAL | Status: AC
  Administered 2017-10-19: 1000 mg via ORAL

## 2017-10-19 MED ORDER — LIDOCAINE 2% (20 MG/ML) 5 ML SYRINGE
INTRAMUSCULAR | Status: AC
  Filled 2017-10-19: qty 5

## 2017-10-19 MED ORDER — BUPIVACAINE HCL (PF) 0.5 % IJ SOLN
INTRAMUSCULAR | Status: AC
  Filled 2017-10-19: qty 30

## 2017-10-19 MED ORDER — ROCURONIUM BROMIDE 100 MG/10ML IV SOLN
INTRAVENOUS | Status: DC | PRN
  Administered 2017-10-19: 40 mg via INTRAVENOUS

## 2017-10-19 MED ORDER — DEXTROSE 5 % IV SOLN: 3.0000 g | INTRAVENOUS | Status: DC

## 2017-10-19 MED ORDER — LACTATED RINGERS IV SOLN
INTRAVENOUS | Status: DC
  Administered 2017-10-19 (×2): via INTRAVENOUS

## 2017-10-19 MED ORDER — EPHEDRINE SULFATE 50 MG/ML IJ SOLN
INTRAMUSCULAR | Status: DC | PRN
  Administered 2017-10-19 (×2): 10 mg via INTRAVENOUS

## 2017-10-19 MED ORDER — PROPOFOL 10 MG/ML IV BOLUS
INTRAVENOUS | Status: AC
  Filled 2017-10-19: qty 20

## 2017-10-19 MED ORDER — DEXAMETHASONE SODIUM PHOSPHATE 10 MG/ML IJ SOLN
INTRAMUSCULAR | Status: AC
  Filled 2017-10-19: qty 1

## 2017-10-19 MED ORDER — ROCURONIUM BROMIDE 50 MG/5ML IV SOSY
PREFILLED_SYRINGE | INTRAVENOUS | Status: AC
  Filled 2017-10-19: qty 5

## 2017-10-19 MED ORDER — PHENYLEPHRINE 40 MCG/ML (10ML) SYRINGE FOR IV PUSH (FOR BLOOD PRESSURE SUPPORT)
PREFILLED_SYRINGE | INTRAVENOUS | Status: AC
  Filled 2017-10-19: qty 20

## 2017-10-19 MED ORDER — MIDAZOLAM HCL 2 MG/2ML IJ SOLN
1.0000 mg | INTRAMUSCULAR | Status: DC | PRN
  Administered 2017-10-19: 2 mg via INTRAVENOUS

## 2017-10-19 MED ORDER — FENTANYL CITRATE (PF) 100 MCG/2ML IJ SOLN
50.0000 ug | INTRAMUSCULAR | Status: DC | PRN
  Administered 2017-10-19: 50 ug via INTRAVENOUS

## 2017-10-19 MED ORDER — CEFAZOLIN SODIUM-DEXTROSE 2-4 GM/100ML-% IV SOLN
INTRAVENOUS | Status: AC
  Filled 2017-10-19: qty 100

## 2017-10-19 MED ORDER — ONDANSETRON HCL 4 MG/2ML IJ SOLN
INTRAMUSCULAR | Status: DC | PRN
  Administered 2017-10-19: 4 mg via INTRAVENOUS

## 2017-10-19 MED ORDER — DEXAMETHASONE SODIUM PHOSPHATE 4 MG/ML IJ SOLN
INTRAMUSCULAR | Status: DC | PRN
  Administered 2017-10-19: 10 mg via INTRAVENOUS

## 2017-10-19 SURGICAL SUPPLY — 51 items
ADH SKN CLS APL DERMABOND .7 (GAUZE/BANDAGES/DRESSINGS) ×1
BLADE CLIPPER SURG (BLADE) IMPLANT
BLADE SURG 15 STRL LF DISP TIS (BLADE) ×1 IMPLANT
BLADE SURG 15 STRL SS (BLADE) ×3
CANISTER SUCT 1200ML W/VALVE (MISCELLANEOUS) ×2 IMPLANT
CHLORAPREP W/TINT 26ML (MISCELLANEOUS) ×3 IMPLANT
COVER BACK TABLE 60X90IN (DRAPES) ×3 IMPLANT
COVER MAYO STAND STRL (DRAPES) ×3 IMPLANT
DECANTER SPIKE VIAL GLASS SM (MISCELLANEOUS) IMPLANT
DERMABOND ADVANCED (GAUZE/BANDAGES/DRESSINGS) ×2
DERMABOND ADVANCED .7 DNX12 (GAUZE/BANDAGES/DRESSINGS) ×1 IMPLANT
DRAIN PENROSE 1/2X12 LTX STRL (WOUND CARE) ×3 IMPLANT
DRAPE LAPAROTOMY TRNSV 102X78 (DRAPE) ×3 IMPLANT
DRAPE UTILITY XL STRL (DRAPES) ×3 IMPLANT
ELECT COATED BLADE 2.86 ST (ELECTRODE) ×3 IMPLANT
ELECT REM PT RETURN 9FT ADLT (ELECTROSURGICAL) ×3
ELECTRODE REM PT RTRN 9FT ADLT (ELECTROSURGICAL) ×1 IMPLANT
GAUZE 4X4 16PLY RFD (DISPOSABLE) IMPLANT
GAUZE SPONGE 4X4 12PLY STRL LF (GAUZE/BANDAGES/DRESSINGS) IMPLANT
GLOVE BIO SURGEON STRL SZ 6.5 (GLOVE) ×1 IMPLANT
GLOVE BIO SURGEONS STRL SZ 6.5 (GLOVE) ×1
GLOVE BIOGEL PI IND STRL 7.0 (GLOVE) IMPLANT
GLOVE BIOGEL PI IND STRL 8 (GLOVE) ×1 IMPLANT
GLOVE BIOGEL PI INDICATOR 7.0 (GLOVE) ×2
GLOVE BIOGEL PI INDICATOR 8 (GLOVE) ×4
GLOVE ECLIPSE 8.0 STRL XLNG CF (GLOVE) ×3 IMPLANT
GOWN STRL REUS W/ TWL LRG LVL3 (GOWN DISPOSABLE) ×2 IMPLANT
GOWN STRL REUS W/TWL LRG LVL3 (GOWN DISPOSABLE) ×6
MESH HERNIA SYS ULTRAPRO LRG (Mesh General) ×2 IMPLANT
NDL HYPO 25X1 1.5 SAFETY (NEEDLE) ×1 IMPLANT
NEEDLE HYPO 25X1 1.5 SAFETY (NEEDLE) ×3 IMPLANT
NS IRRIG 1000ML POUR BTL (IV SOLUTION) ×2 IMPLANT
PACK BASIN DAY SURGERY FS (CUSTOM PROCEDURE TRAY) ×3 IMPLANT
PENCIL BUTTON HOLSTER BLD 10FT (ELECTRODE) ×3 IMPLANT
SLEEVE SCD COMPRESS KNEE MED (MISCELLANEOUS) ×3 IMPLANT
SPONGE LAP 4X18 RFD (DISPOSABLE) ×3 IMPLANT
SUT MON AB 4-0 PC3 18 (SUTURE) ×3 IMPLANT
SUT NOVA 0 T19/GS 22DT (SUTURE) ×6 IMPLANT
SUT VIC AB 2-0 SH 27 (SUTURE) ×3
SUT VIC AB 2-0 SH 27XBRD (SUTURE) ×1 IMPLANT
SUT VIC AB 3-0 54X BRD REEL (SUTURE) IMPLANT
SUT VIC AB 3-0 BRD 54 (SUTURE)
SUT VICRYL 3-0 CR8 SH (SUTURE) ×3 IMPLANT
SUT VICRYL AB 2 0 TIE (SUTURE) IMPLANT
SUT VICRYL AB 2 0 TIES (SUTURE)
SYR CONTROL 10ML LL (SYRINGE) ×3 IMPLANT
TOWEL GREEN STERILE FF (TOWEL DISPOSABLE) ×3 IMPLANT
TOWEL OR NON WOVEN STRL DISP B (DISPOSABLE) ×1 IMPLANT
TUBE CONNECTING 20'X1/4 (TUBING)
TUBE CONNECTING 20X1/4 (TUBING) IMPLANT
YANKAUER SUCT BULB TIP NO VENT (SUCTIONS) IMPLANT

## 2017-10-19 NOTE — Interval H&P Note (Signed)
History and Physical Interval Note:  10/19/2017 7:22 AM  Mike Ray  has presented today for surgery, with the diagnosis of right inguinal hernia  The various methods of treatment have been discussed with the patient and family. After consideration of risks, benefits and other options for treatment, the patient has consented to  Procedure(s) with comments: [name] (Right) - Tap block INSERTION OF MESH (Right) as a surgical intervention .  The patient's history has been reviewed, patient examined, no change in status, stable for surgery.  I have reviewed the patient's chart and labs.  Questions were answered to the patient's satisfaction.     [place]

## 2017-10-19 NOTE — Op Note (Signed)
Right inguinal hernia, Open, Procedure Note with mesh  Indications: The patient presented with a history of a reducible large right inguinal hernia.  We discussed laparoscopic and open techniques.  We discussed the use of mesh and long-term expectations and natural history of hernias.  He opted for repair of his right anal hernia with mesh.The risk of hernia repair include bleeding,  Infection,   Recurrence of the hernia,  Mesh use, chronic pain,  Organ injury,  Bowel injury,  Bladder injury,   nerve injury with numbness around the incision,  Death,  and worsening of preexisting  medical problems.  The alternatives to surgery have been discussed as well..  Long term expectations of both operative and non operative treatments have been discussed.   The patient agrees to proceed. Pre-operative Diagnosis: Reducible right inguinal hernia  Post-operative Diagnosis: same (direct )  Surgeon: Turner Daniels MD   Assistants: None  Anesthesia: General endotracheal anesthesia, Local anesthesia 0.25.% bupivacaine, with epinephrine and TAP block  ASA Class: 2  Procedure Details  The patient was seen again in the Holding Room. The risks, benefits, complications, treatment options, and expected outcomes were discussed with the patient. The possibilities of reaction to medication, pulmonary aspiration, perforation of viscus, bleeding, recurrent infection, the need for additional procedures, and development of a complication requiring transfusion or further operation were discussed with the patient and/or family. There was concurrence with the proposed plan, and informed consent was obtained. The site of surgery was properly noted/marked. The patient was taken to the Operating Room, identified as Mike Ray, and the procedure verified as hernia repair. A Time Out was held and the above information confirmed.  The patient was placed in the supine position and underwent induction of anesthesia, the lower  abdomen and groin was prepped and draped in the standard fashion, and 0.25% Marcaine with epinephrine was used to anesthetize the skin over the mid-portion of the inguinal canal. A transverse incision was made. Dissection was carried through the soft tissue to expose the inguinal canal and inguinal ligament along its lower edge. The external oblique fascia was split along the course of its fibers, exposing the inguinal canal. The cord and nerve were looped using a Penrose drain and reflected out of the field. The defect was exposed and a piece of prolene hernia system ultrapro mesh was and placed into the direct defect. Interupted 1-0 novafil suture was then used  to repair the defect, with the suture being sewn from the pubic tubercle inferiorly and superiorly along the canal to a level just beyond the internal ring. The mesh was split to allow passage of the cord and nerve into the canal without entrapment.  The ilioinguinal nerve was identified it was tethered to the mesh.  I had concerns about postoperative neuralgia therefore divided the nerve as it exited the muscle.  The contents were then returned to canal and the external oblique fashion was then closed in a continuous fashion using 3-0 Vicryl suture taking care not to cause entrapment. Scarpa's layer closed with 3 0 vicryl and 4 0 monocryl used to close the skin.  Dermabond used for dressing.  Instrument, sponge, and needle counts were correct prior to closure and at the conclusion of the case.  Findings: Hernia as above  Estimated Blood Loss: Minimal         Drains: None         Total IV Fluids: Per anesthesia record  Specimens: None               Complications: None; patient tolerated the procedure well.         Disposition: PACU - hemodynamically stable.         Condition: stable

## 2017-10-19 NOTE — Anesthesia Procedure Notes (Signed)
Anesthesia Regional Block: TAP block   Pre-Anesthetic Checklist: ,, timeout performed, Correct Patient, Correct Site, Correct Laterality, Correct Procedure, Correct Position, site marked, Risks and benefits discussed,  Surgical consent,  Pre-op evaluation,  At surgeon's request and post-op pain management  Laterality: Right  Prep: chloraprep       Needles:  Injection technique: Single-shot  Needle Type: Stimiplex     Needle Length: 9cm      Additional Needles:   Procedures:,,,, ultrasound used (permanent image in chart),,,,  Narrative:  Start time: 10/19/2017 7:20 AM End time: 10/19/2017 7:25 AM Injection made incrementally with aspirations every 5 mL.  Performed by: Personally  Anesthesiologist: Nolon Nations, MD  Additional Notes: Patient tolerated well. Good fascial spread noted.

## 2017-10-19 NOTE — Discharge Instructions (Signed)
Post Anesthesia Home Care Instructions  Activity: Get plenty of rest for the remainder of the day. A responsible individual must stay with you for 24 hours following the procedure.  For the next 24 hours, DO NOT: -Drive a car -Paediatric nurse -Drink alcoholic beverages -Take any medication unless instructed by your physician -Make any legal decisions or sign important papers.  Meals: Start with liquid foods such as gelatin or soup. Progress to regular foods as tolerated. Avoid greasy, spicy, heavy foods. If nausea and/or vomiting occur, drink only clear liquids until the nausea and/or vomiting subsides. Call your physician if vomiting continues.  Special Instructions/Symptoms: Your throat may feel dry or sore from the anesthesia or the breathing tube placed in your throat during surgery. If this causes discomfort, gargle with warm salt water. The discomfort should disappear within 24 hours.  If you had a scopolamine patch placed behind your ear for the management of post- operative nausea and/or vomiting:  1. The medication in the patch is effective for 72 hours, after which it should be removed.  Wrap patch in a tissue and discard in the trash. Wash hands thoroughly with soap and water. 2. You may remove the patch earlier than 72 hours if you experience unpleasant side effects which may include dry mouth, dizziness or visual disturbances. 3. Avoid touching the patch. Wash your hands with soap and water after contact with the patch.     [facility] _______[facility]  UMBILICAL OR INGUINAL HERNIA REPAIR: POST OP INSTRUCTIONS  Always review your discharge instruction sheet given to you by the facility where your surgery was performed. IF YOU HAVE DISABILITY OR FAMILY LEAVE FORMS, YOU MUST BRING THEM TO THE OFFICE FOR PROCESSING.   DO NOT GIVE THEM TO YOUR DOCTOR.  1. A  prescription for pain medication may be given to you upon discharge.  Take your pain medication as  prescribed, if needed.  If narcotic pain medicine is not needed, then you may take acetaminophen (Tylenol) or ibuprofen (Advil) as needed. 2. Take your usually prescribed medications unless otherwise directed. If you need a refill on your pain medication, please contact your pharmacy.  They will contact our office to request authorization. Prescriptions will not be filled after 5 pm or on week-ends. 3. You should follow a light diet the first 24 hours after arrival home, such as soup and crackers, etc.  Be sure to include lots of fluids daily.  Resume your normal diet the day after surgery. 4.Most patients will experience some swelling and bruising around the umbilicus or in the groin and scrotum.  Ice packs and reclining will help.  Swelling and bruising can take several days to resolve.  6. It is common to experience some constipation if taking pain medication after surgery.  Increasing fluid intake and taking a stool softener (such as Colace) will usually help or prevent this problem from occurring.  A mild laxative (Milk of Magnesia or Miralax) should be taken according to package directions if there are no bowel movements after 48 hours. 7. Unless discharge instructions indicate otherwise, you may remove your bandages 24-48 hours after surgery, and you may shower at that time.  You may have steri-strips (small skin tapes) in place directly over the incision.  These strips should be left on the skin for 7-10 days.  If your surgeon used skin glue on the incision, you may shower in 24 hours.  The glue will flake off over the next 2-3 weeks.  Any  sutures or staples will be removed at the office during your follow-up visit. 8. ACTIVITIES:  You may resume regular (light) daily activities beginning the next day--such as daily self-care, walking, climbing stairs--gradually increasing activities as tolerated.  You may have sexual intercourse when it is comfortable.  Refrain from any heavy lifting or straining  until approved by your doctor.  a.You may drive when you are no longer taking prescription pain medication, you can comfortably wear a seatbelt, and you can safely maneuver your car and apply brakes. b.RETURN TO WORK:   _____________________________________________  9.You should see your doctor in the office for a follow-up appointment approximately 2-3 weeks after your surgery.  Make sure that you call for this appointment within a day or two after you arrive home to insure a convenient appointment time. 10.OTHER INSTRUCTIONS: _________________________    _____________________________________  WHEN TO CALL YOUR DOCTOR: 1. Fever over 101.0 2. Inability to urinate 3. Nausea and/or vomiting 4. Extreme swelling or bruising 5. Continued bleeding from incision. 6. Increased pain, redness, or drainage from the incision  The clinic staff is available to answer your questions during regular business hours.  Please dont hesitate to call and ask to speak to one of the nurses for clinical concerns.  If you have a medical emergency, go to the nearest emergency room or call 911.  A surgeon from [facility] is always on call at the hospital   [address], [place], [place], [place]  [address] ?  [address], [place], [place]   [address] [phone] ? [phone] ? FAX [phone] Web site: [email or web address]   Regional Anesthesia Blocks  1. Numbness or the inability to move the "blocked" extremity may last from 3-48 hours after placement. The length of time depends on the medication injected and your individual response to the medication. If the numbness is not going away after 48 hours, call your surgeon.  2. The extremity that is blocked will need to be protected until the numbness is gone and the  Strength has returned. Because you cannot feel it, you will need to take extra care to avoid injury. Because it may be weak, you may have difficulty moving it or  using it. You may not know what position it is in without looking at it while the block is in effect.  3. For blocks in the legs and feet, returning to weight bearing and walking needs to be done carefully. You will need to wait until the numbness is entirely gone and the strength has returned. You should be able to move your leg and foot normally before you try and bear weight or walk. You will need someone to be with you when you first try to ensure you do not fall and possibly risk injury.  4. Bruising and tenderness at the needle site are common side effects and will resolve in a few days.  5. Persistent numbness or new problems with movement should be communicated to the surgeon or the [place] [phone] [place] [phone]).

## 2017-10-19 NOTE — Anesthesia Procedure Notes (Signed)
Procedure Name: Intubation Date/Time: 10/19/2017 7:42 AM Performed by: Marrianne Mood, CRNA Pre-anesthesia Checklist: Patient identified, Emergency Drugs available, Suction available, Patient being monitored and Timeout performed Patient Re-evaluated:Patient Re-evaluated prior to induction Oxygen Delivery Method: Circle system utilized Preoxygenation: Pre-oxygenation with 100% oxygen Induction Type: IV induction Ventilation: Mask ventilation without difficulty Laryngoscope Size: Miller and 3 Grade View: Grade III Tube type: Oral Tube size: 8.0 mm Number of attempts: 1 Airway Equipment and Method: Stylet and Oral airway Placement Confirmation: ETT inserted through vocal cords under direct vision,  positive ETCO2 and breath sounds checked- equal and bilateral Secured at: 23 cm Tube secured with: Tape Dental Injury: Teeth and Oropharynx as per pre-operative assessment

## 2017-10-19 NOTE — Anesthesia Preprocedure Evaluation (Signed)
Anesthesia Evaluation  Patient identified by MRN, date of birth, ID band Patient awake    Reviewed: Allergy & Precautions, NPO status , Patient's Chart, lab work & pertinent test results  Airway Mallampati: II  TM Distance: >3 FB Neck ROM: Full    Dental no notable dental hx.    Pulmonary neg pulmonary ROS, former smoker,    Pulmonary exam normal breath sounds clear to auscultation       Cardiovascular hypertension, + CAD and + Past MI  Normal cardiovascular exam Rhythm:Regular Rate:Normal     Neuro/Psych negative neurological ROS  negative psych ROS   GI/Hepatic negative GI ROS, Neg liver ROS,   Endo/Other  negative endocrine ROS  Renal/GU negative Renal ROS     Musculoskeletal  (+) Arthritis ,   Abdominal   Peds  Hematology negative hematology ROS (+)   Anesthesia Other Findings   Reproductive/Obstetrics negative OB ROS                             Anesthesia Physical Anesthesia Plan  ASA: III  Anesthesia Plan: General   Post-op Pain Management: GA combined w/ Regional for post-op pain   Induction: Intravenous  PONV Risk Score and Plan: 4 or greater and Ondansetron, Dexamethasone and Treatment may vary due to age or medical condition  Airway Management Planned: LMA  Additional Equipment:   Intra-op Plan:   Post-operative Plan: Extubation in OR  Informed Consent: I have reviewed the patients History and Physical, chart, labs and discussed the procedure including the risks, benefits and alternatives for the proposed anesthesia with the patient or authorized representative who has indicated his/her understanding and acceptance.   Dental advisory given  Plan Discussed with: CRNA  Anesthesia Plan Comments:         Anesthesia Quick Evaluation

## 2017-10-19 NOTE — Transfer of Care (Signed)
Immediate Anesthesia Transfer of Care Note  Patient: Mike Ray  Procedure(s) Performed: RIGHT  INGUINAL HERNIA REPAIR ERAS PATHWAY (Right Abdomen) INSERTION OF MESH (Right Abdomen)  Patient Location: PACU  Anesthesia Type:GA combined with regional for post-op pain  Level of Consciousness: sedated  Airway & Oxygen Therapy: Patient Spontanous Breathing and Patient connected to face mask oxygen  Post-op Assessment: Report given to RN and Post -op Vital signs reviewed and stable  Post vital signs: Reviewed and stable  Last Vitals:  Vitals Value Taken Time  BP 140/94 10/19/2017  8:55 AM  Temp    Pulse 95 10/19/2017  8:56 AM  Resp 15 10/19/2017  8:56 AM  SpO2 98 % 10/19/2017  8:56 AM  Vitals shown include unvalidated device data.  Last Pain:  Vitals:   10/19/17 0627  TempSrc: Oral  PainSc: 5       Patients Stated Pain Goal: 2 (29/51/88 4166)  Complications: No apparent anesthesia complications

## 2017-10-19 NOTE — Progress Notes (Signed)
Patient or family member left an orange Yeti cup in post-op when they left.  I called patient's phone and left message to let them know that we have it.

## 2017-10-19 NOTE — Progress Notes (Signed)
Assisted Dr. Germeroth with right, ultrasound guided, transabdominal plane block. Side rails up, monitors on throughout procedure. See vital signs in flow sheet. Tolerated Procedure well. 

## 2017-10-20 ENCOUNTER — Encounter (HOSPITAL_BASED_OUTPATIENT_CLINIC_OR_DEPARTMENT_OTHER): Payer: Self-pay | Admitting: Surgery

## 2017-10-20 NOTE — Anesthesia Postprocedure Evaluation (Signed)
Anesthesia Post Note  Patient: Mike Ray  Procedure(s) Performed: RIGHT  INGUINAL HERNIA REPAIR ERAS PATHWAY (Right Abdomen) INSERTION OF MESH (Right Abdomen)     Patient location during evaluation: PACU Anesthesia Type: General Level of consciousness: sedated and patient cooperative Pain management: pain level controlled Vital Signs Assessment: post-procedure vital signs reviewed and stable Respiratory status: spontaneous breathing Cardiovascular status: stable Anesthetic complications: no    Last Vitals:  Vitals:   10/19/17 0930 10/19/17 0945  BP: 131/89 122/88  Pulse: 92 89  Resp: 19 18  Temp:  36.6 C  SpO2: 94% 95%    Last Pain:  Vitals:   10/19/17 0945  TempSrc:   PainSc: 0-No pain                 Nolon Nations

## 2017-10-21 ENCOUNTER — Other Ambulatory Visit: Payer: Self-pay | Admitting: Nurse Practitioner

## 2017-10-21 ENCOUNTER — Encounter: Payer: Self-pay | Admitting: Internal Medicine

## 2017-11-18 DIAGNOSIS — Z23 Encounter for immunization: Secondary | ICD-10-CM | POA: Diagnosis not present

## 2017-12-06 ENCOUNTER — Encounter: Payer: Self-pay | Admitting: Internal Medicine

## 2017-12-06 NOTE — Telephone Encounter (Signed)
Documented flu shot.../LMB  

## 2017-12-12 ENCOUNTER — Other Ambulatory Visit: Payer: Self-pay

## 2017-12-15 DIAGNOSIS — M25561 Pain in right knee: Secondary | ICD-10-CM | POA: Diagnosis not present

## 2017-12-15 DIAGNOSIS — M19011 Primary osteoarthritis, right shoulder: Secondary | ICD-10-CM | POA: Diagnosis not present

## 2017-12-15 DIAGNOSIS — M1711 Unilateral primary osteoarthritis, right knee: Secondary | ICD-10-CM | POA: Diagnosis not present

## 2017-12-15 DIAGNOSIS — M25511 Pain in right shoulder: Secondary | ICD-10-CM | POA: Diagnosis not present

## 2017-12-21 ENCOUNTER — Other Ambulatory Visit: Payer: Self-pay | Admitting: *Deleted

## 2017-12-21 MED ORDER — NITROGLYCERIN 0.4 MG SL SUBL: 0.4000 mg | SUBLINGUAL_TABLET | SUBLINGUAL | 5 refills | Status: DC | PRN

## 2017-12-28 DIAGNOSIS — M19011 Primary osteoarthritis, right shoulder: Secondary | ICD-10-CM | POA: Diagnosis not present

## 2018-01-10 NOTE — Patient Instructions (Addendum)
  Tests ordered today. Your results will be released to MyChart (or called to you) after review, usually within 72hours after test completion. If any changes need to be made, you will be notified at that same time.  Medications reviewed and updated.  Changes include :   none      Please followup in 6 months   

## 2018-01-10 NOTE — Progress Notes (Addendum)
Subjective:   Mike Ray is a 67 y.o. male who presents for Medicare Annual/Subsequent preventive examination.  Review of Systems:  No ROS.  Medicare Wellness Visit. Additional risk factors are reflected in the social history.  Cardiac Risk Factors include: advanced age (>52men, >45 women);dyslipidemia;hypertension;male gender Sleep patterns: feels rested on waking, gets up 1 times nightly to void and sleeps 7-8 hours nightly.    Home Safety/Smoke Alarms: Feels safe in home. Smoke alarms in place. Living environment; residence and Firearm Safety: 1-story house/ trailer, no firearms. Lives with wife, no needs for DME, good support system Seat Belt Safety/Bike Helmet: Wears seat belt.     Objective:    Vitals: BP (!) 144/88   Pulse 73   Resp 16   Ht 5\' 11"  (1.803 m)   Wt 232 lb (105.2 kg)   SpO2 95%   BMI 32.36 kg/m   Body mass index is 32.36 kg/m.  Advanced Directives 01/11/2018 10/19/2017 10/14/2017 01/10/2017 07/30/2015  Does Patient Have a Medical Advance Directive? Yes Yes Yes Yes Yes  Type of Paramedic of [place];Living will Living will Living will [place];Living will [place];Living will  Does patient want to make changes to medical advance directive? - No - Patient declined No - Patient declined - No - Patient declined  Copy of [place] in Chart? No - copy requested No - copy requested No - copy requested No - copy requested No - copy requested    Tobacco Social History   Tobacco Use  Smoking Status Former Smoker  . Last attempt to quit: 01/26/2000  . Years since quitting: 17.9  Smokeless Tobacco Never Used  Tobacco Comment   Age 60     Counseling given: Not Answered Comment: Age 21  Past Medical History:  Diagnosis Date  . Acute MI, inferolateral wall, initial episode of care ([place]) 07/30/2015  . Coronary artery disease   . Elevated PSA    Dr Barnie Del  . Gilbert's  syndrome   . History of heat stroke 2009  . History of nephrolithiasis   . Hyperlipidemia    NMR 06/2009: LDL 37(1062/694), HDL 40, TG 127. Framingham Study LDL goal =< 130   . Hypertension   . Osteoarthritis of left hip    Past Surgical History:  Procedure Laterality Date  . CARDIAC CATHETERIZATION    . CARDIAC CATHETERIZATION N/A 07/30/2015   Procedure: Left Heart Cath and Coronary Angiography;  Surgeon: Sherren Mocha, MD;  Location: [facility];  Service: Cardiovascular;  Laterality: N/A;  . CARDIAC CATHETERIZATION N/A 07/30/2015   Procedure: Coronary Stent Intervention;  Surgeon: Sherren Mocha, MD;  Location: [facility];  Service: Cardiovascular;  Laterality: N/A;  Distal RCA- Promus 3.50x16  . COLONOSCOPY  2010   negative  . INGUINAL HERNIA REPAIR Right 10/19/2017   Procedure: RIGHT  INGUINAL HERNIA REPAIR ERAS PATHWAY;  Surgeon: Erroll Luna, MD;  Location: [facility];  Service: General;  Laterality: Right;  Tap block  . INSERTION OF MESH Right 10/19/2017   Procedure: INSERTION OF MESH;  Surgeon: Erroll Luna, MD;  Location: [facility];  Service: General;  Laterality: Right;  . LITHOTRIPSY  2009   Dr.Duckett, [place]   . TOTAL HIP ARTHROPLASTY  2008   left  . TOTAL HIP ARTHROPLASTY  2004   right   Family History  Problem Relation Age of Onset  . Diabetes Mother   .  Hypertension Mother   . Heart attack Father 27       smoker  . Prostate cancer Brother 38       CAD; stents @ 64  . Hypertension Brother   . Heart attack Brother 37  . Coronary artery disease Brother        stents late 16s  . Stroke Neg Hx    Social History   Socioeconomic History  . Marital status: Married    Spouse name: Not on file  . Number of children: 3  . Years of education: Not on file  . Highest education level: Not on file  Occupational History  . Occupation: Museum/gallery curator  . Financial resource strain: Not hard at all  . Food  insecurity:    Worry: Never true    Inability: Never true  . Transportation needs:    Medical: No    Non-medical: No  Tobacco Use  . Smoking status: Former Smoker    Last attempt to quit: 01/26/2000    Years since quitting: 17.9  . Smokeless tobacco: Never Used  . Tobacco comment: Age 47  Substance and Sexual Activity  . Alcohol use: Yes    Comment: 15 beers/week  . Drug use: No  . Sexual activity: Yes    Birth control/protection: Other-see comments  Lifestyle  . Physical activity:    Days per week: 0 days    Minutes per session: 0 min  . Stress: To some extent  Relationships  . Social connections:    Talks on phone: More than three times a week    Gets together: More than three times a week    Attends religious service: More than 4 times per year    Active member of club or organization: Yes    Attends meetings of clubs or organizations: More than 4 times per year    Relationship status: Married  Other Topics Concern  . Not on file  Social History Narrative  . Not on file    Outpatient Encounter Medications as of 01/11/2018  Medication Sig  . amLODipine (NORVASC) 10 MG tablet Take 0.5 tablets (5 mg total) by mouth daily.  Marland Kitchen aspirin 81 MG tablet Take 81 mg by mouth daily.  Marland Kitchen atorvastatin (LIPITOR) 80 MG tablet TAKE 1 TABLET(80 MG) BY MOUTH DAILY AT 6 PM  . fish oil-omega-3 fatty acids 1000 MG capsule Take 1 g by mouth every other day.   . losartan (COZAAR) 100 MG tablet Take 1 tablet (100 mg total) by mouth daily.  . metoprolol tartrate (LOPRESSOR) 25 MG tablet TAKE 1/2 (ONE-HALF) TABLET BY MOUTH TWICE DAILY  . Multiple Vitamin (MULTIVITAMIN) tablet Take 1 tablet by mouth every other day.   . nitroGLYCERIN (NITROSTAT) 0.4 MG SL tablet Place 1 tablet (0.4 mg total) under the tongue every 5 (five) minutes x 3 doses as needed for chest pain.   No facility-administered encounter medications on file as of 01/11/2018.     Activities of Daily Living In your present state of  health, do you have any difficulty performing the following activities: 01/11/2018 10/19/2017  Hearing? N N  Vision? N N  Difficulty concentrating or making decisions? N N  Walking or climbing stairs? N N  Dressing or bathing? N N  Doing errands, shopping? N -  Preparing Food and eating ? N -  Using the Toilet? N -  In the past six months, have you accidently leaked urine? N -  Do you have problems  with loss of bowel control? N -  Managing your Medications? N -  Managing your Finances? N -  Housekeeping or managing your Housekeeping? N -  Some recent data might be hidden    Patient Care Team: Binnie Rail, MD as PCP - General (Internal Medicine) Sherren Mocha, MD as PCP - Cardiology (Cardiology)   Assessment:   This is a routine wellness examination for [place]. Physical assessment deferred to PCP.   Exercise Activities and Dietary recommendations Current Exercise Habits: Structured exercise class, Time (Minutes): 45, Frequency (Times/Week): 3, Weekly Exercise (Minutes/Week): 135, Intensity: Mild  Diet (meal preparation, eat out, water intake, caffeinated beverages, dairy products, fruits and vegetables): in general, a "healthy" diet  , well balanced .   Reviewed heart healthy and diabetic diet. Encouraged patient to increase daily water and healthy fluid intake.  Goals    . Patient Stated     I would like to lose weight with a goal of 205 pounds. I will do this by continuing to swim, and monitor the amount of sugar and carbohydrates in my diet.     . Patient Stated     Lose weight to the goal of 200 Pounds. Once I get over my hernia surgery, I will begin swimming again and monitor my diet closely.       Fall Risk Fall Risk  01/11/2018 01/10/2017 01/10/2017 01/05/2016  Falls in the past year? 0 No No No   Depression Screen PHQ 2/9 Scores 01/11/2018 01/10/2017 01/10/2017 01/05/2016  PHQ - 2 Score 1 0 0 0    Cognitive Function       Ad8 score reviewed for  issues:  Issues making decisions: no  Less interest in hobbies / activities: no  Repeats questions, stories (family complaining): no  Trouble using ordinary gadgets (microwave, computer, phone):no  Forgets the month or year: no  Mismanaging finances: no  Remembering appts: no  Daily problems with thinking and/or memory: no Ad8 score is= 0  Immunization History  Administered Date(s) Administered  . Influenza, High Dose Seasonal PF 10/25/2017  . Influenza-Unspecified 10/26/2015, 11/08/2016  . Pneumococcal Conjugate-13 01/05/2016  . Pneumococcal Polysaccharide-23 01/10/2017  . Tdap 05/29/2012  . Zoster 05/01/2012   Screening Tests Health Maintenance  Topic Date Due  . COLONOSCOPY  01/10/2020  . TETANUS/TDAP  05/30/2022  . INFLUENZA VACCINE  Completed  . Hepatitis C Screening  Completed  . PNA vac Low Risk Adult  Completed      Plan:     Continue doing brain stimulating activities (puzzles, reading, adult coloring books, staying active) to keep memory sharp.   Continue to eat heart healthy diet (full of fruits, vegetables, whole grains, lean protein, water--limit salt, fat, and sugar intake) and increase physical activity as tolerated.  I have personally reviewed and noted the following in the patient's chart:   . Medical and social history . Use of alcohol, tobacco or illicit drugs  . Current medications and supplements . Functional ability and status . Nutritional status . Physical activity . Advanced directives . List of other physicians . Vitals . Screenings to include cognitive, depression, and falls . Referrals and appointments  In addition, I have reviewed and discussed with patient certain preventive protocols, quality metrics, and best practice recommendations. A written personalized care plan for preventive services as well as general preventive health recommendations were provided to patient.     Michiel Cowboy, RN  01/11/2018   Medical screening  examination/treatment/procedure(s) were performed by non-physician practitioner  and as supervising physician I was immediately available for consultation/collaboration. I agree with above. Binnie Rail, MD

## 2018-01-10 NOTE — Progress Notes (Signed)
Subjective:    Patient ID: Mike Ray, male    DOB: 1950/05/13, 67 y.o.   MRN: [id number]  HPI The patient is here for follow up.  CAD, Hypertension: He is taking his medication daily. He is compliant with a low sodium diet.  He denies chest pain, palpitations, edema, shortness of breath and regular headaches. He is not exercising regularly.  He does not monitor his blood pressure at home.    Hyperlipidemia: He is taking his medication daily. He is compliant with a low fat/cholesterol diet. He is not exercising regularly. He denies myalgias.   Prediabetes:  He is compliant with a low sugar/carbohydrate diet.  He is not exercising regularly.  Right inguinal hernia:   He still has discomfort in the right inguinal area.  It can be a burning sensation.  He has a follow up with surgery.  Medications and allergies reviewed with patient and updated if appropriate.  Patient Active Problem List   Diagnosis Date Noted  . Non-recurrent unilateral inguinal hernia without obstruction or gangrene 09/12/2017  . Prediabetes 07/04/2016  . CAD (coronary artery disease) 01/04/2016  . History of ST elevation myocardial infarction (STEMI) 01/04/2016  . NEPHROLITHIASIS, HX OF 10/22/2008  . CHOLELITHIASIS 11/08/2007  . Essential hypertension 10/17/2007  . HYPERLIPIDEMIA 12/13/2006  . ELEVATED PROSTATE SPECIFIC ANTIGEN 09/16/2006    Current Outpatient Medications on File Prior to Visit  Medication Sig Dispense Refill  . amLODipine (NORVASC) 10 MG tablet Take 0.5 tablets (5 mg total) by mouth daily. 45 tablet 3  . aspirin 81 MG tablet Take 81 mg by mouth daily.    Marland Kitchen atorvastatin (LIPITOR) 80 MG tablet TAKE 1 TABLET(80 MG) BY MOUTH DAILY AT 6 PM 90 tablet 2  . fish oil-omega-3 fatty acids 1000 MG capsule Take 1 g by mouth every other day.     . losartan (COZAAR) 100 MG tablet Take 1 tablet (100 mg total) by mouth daily. 90 tablet 3  . metoprolol tartrate (LOPRESSOR) 25 MG tablet TAKE 1/2  (ONE-HALF) TABLET BY MOUTH TWICE DAILY 90 tablet 2  . Multiple Vitamin (MULTIVITAMIN) tablet Take 1 tablet by mouth every other day.     . nitroGLYCERIN (NITROSTAT) 0.4 MG SL tablet Place 1 tablet (0.4 mg total) under the tongue every 5 (five) minutes x 3 doses as needed for chest pain. 25 tablet 5   No current facility-administered medications on file prior to visit.     Past Medical History:  Diagnosis Date  . Acute MI, inferolateral wall, initial episode of care ([place]) 07/30/2015  . Coronary artery disease   . Elevated PSA    Dr Barnie Del  . Gilbert's syndrome   . History of heat stroke 2009  . History of nephrolithiasis   . Hyperlipidemia    NMR 06/2009: LDL 09(2330/076), HDL 40, TG 127. Framingham Study LDL goal =< 130   . Hypertension   . Osteoarthritis of left hip     Past Surgical History:  Procedure Laterality Date  . CARDIAC CATHETERIZATION    . CARDIAC CATHETERIZATION N/A 07/30/2015   Procedure: Left Heart Cath and Coronary Angiography;  Surgeon: Sherren Mocha, MD;  Location: [facility];  Service: Cardiovascular;  Laterality: N/A;  . CARDIAC CATHETERIZATION N/A 07/30/2015   Procedure: Coronary Stent Intervention;  Surgeon: Sherren Mocha, MD;  Location: [facility];  Service: Cardiovascular;  Laterality: N/A;  Distal RCA- Promus 3.50x16  . COLONOSCOPY  2010   negative  . INGUINAL HERNIA REPAIR  Right 10/19/2017   Procedure: RIGHT  INGUINAL HERNIA REPAIR ERAS PATHWAY;  Surgeon: Erroll Luna, MD;  Location: [facility];  Service: General;  Laterality: Right;  Tap block  . INSERTION OF MESH Right 10/19/2017   Procedure: INSERTION OF MESH;  Surgeon: Erroll Luna, MD;  Location: [facility];  Service: General;  Laterality: Right;  . LITHOTRIPSY  2009   Dr.Duckett, [place]   . TOTAL HIP ARTHROPLASTY  2008   left  . TOTAL HIP ARTHROPLASTY  2004   right    Social History   Socioeconomic History  . Marital status: Married     Spouse name: Not on file  . Number of children: Not on file  . Years of education: Not on file  . Highest education level: Not on file  Occupational History  . Not on file  Social Needs  . Financial resource strain: Not on file  . Food insecurity:    Worry: Not on file    Inability: Not on file  . Transportation needs:    Medical: Not on file    Non-medical: Not on file  Tobacco Use  . Smoking status: Former Smoker    Last attempt to quit: 01/26/2000    Years since quitting: 17.9  . Smokeless tobacco: Never Used  . Tobacco comment: Age 30  Substance and Sexual Activity  . Alcohol use: Yes    Comment: 15 beers/week  . Drug use: No  . Sexual activity: Yes    Birth control/protection: Other-see comments  Lifestyle  . Physical activity:    Days per week: Not on file    Minutes per session: Not on file  . Stress: Not on file  Relationships  . Social connections:    Talks on phone: Not on file    Gets together: Not on file    Attends religious service: Not on file    Active member of club or organization: Not on file    Attends meetings of clubs or organizations: Not on file    Relationship status: Not on file  Other Topics Concern  . Not on file  Social History Narrative  . Not on file    Family History  Problem Relation Age of Onset  . Diabetes Mother   . Hypertension Mother   . Heart attack Father 56       smoker  . Prostate cancer Brother 29       CAD; stents @ 81  . Hypertension Brother   . Heart attack Brother 80  . Coronary artery disease Brother        stents late 84s  . Stroke Neg Hx     Review of Systems  Constitutional: Negative for chills and fever.  Respiratory: Positive for cough (intermittent). Negative for shortness of breath and wheezing.   Cardiovascular: Negative for chest pain, palpitations and leg swelling.  Neurological: Positive for headaches (occ). Negative for dizziness and light-headedness.       Objective:   Vitals:   01/11/18  0737  BP: (!) 144/88  Pulse: 73  Resp: 16  Temp: 98.9 F (37.2 C)  SpO2: 95%   BP Readings from Last 3 Encounters:  01/11/18 (!) 144/88  10/19/17 122/88  10/05/17 140/86   Wt Readings from Last 3 Encounters:  01/11/18 232 lb (105.2 kg)  10/19/17 225 lb 8.5 oz (102.3 kg)  10/11/17 225 lb (102.1 kg)   Body mass index is 32.36 kg/m.   Physical Exam  Constitutional: Appears well-developed and well-nourished. No distress.  HENT:  Head: Normocephalic and atraumatic.  Neck: Neck supple. No tracheal deviation present. No thyromegaly present.  No cervical lymphadenopathy Cardiovascular: Normal rate, regular rhythm and normal heart sounds.   No murmur heard. No carotid bruit .  No edema Pulmonary/Chest: Effort normal and breath sounds normal. No respiratory distress. No has no wheezes. No rales.  Skin: Skin is warm and dry. Not diaphoretic.  Psychiatric: Normal mood and affect. Behavior is normal.      Assessment & Plan:    See Problem List for Assessment and Plan of chronic medical problems.

## 2018-01-11 ENCOUNTER — Other Ambulatory Visit (INDEPENDENT_AMBULATORY_CARE_PROVIDER_SITE_OTHER): Payer: Medicare Other

## 2018-01-11 ENCOUNTER — Ambulatory Visit (INDEPENDENT_AMBULATORY_CARE_PROVIDER_SITE_OTHER): Payer: Medicare Other | Admitting: Internal Medicine

## 2018-01-11 ENCOUNTER — Encounter: Payer: Self-pay | Admitting: Internal Medicine

## 2018-01-11 ENCOUNTER — Ambulatory Visit: Payer: Medicare Other | Admitting: Internal Medicine

## 2018-01-11 ENCOUNTER — Ambulatory Visit (INDEPENDENT_AMBULATORY_CARE_PROVIDER_SITE_OTHER): Payer: Medicare Other | Admitting: *Deleted

## 2018-01-11 VITALS — BP 144/88 | HR 73 | Resp 16 | Ht 71.0 in | Wt 232.0 lb

## 2018-01-11 VITALS — BP 144/88 | HR 73 | Temp 98.9°F | Resp 16 | Ht 71.0 in | Wt 232.0 lb

## 2018-01-11 DIAGNOSIS — I25119 Atherosclerotic heart disease of native coronary artery with unspecified angina pectoris: Secondary | ICD-10-CM

## 2018-01-11 DIAGNOSIS — I10 Essential (primary) hypertension: Secondary | ICD-10-CM

## 2018-01-11 DIAGNOSIS — R7303 Prediabetes: Secondary | ICD-10-CM

## 2018-01-11 DIAGNOSIS — I2510 Atherosclerotic heart disease of native coronary artery without angina pectoris: Secondary | ICD-10-CM | POA: Diagnosis not present

## 2018-01-11 DIAGNOSIS — E782 Mixed hyperlipidemia: Secondary | ICD-10-CM

## 2018-01-11 DIAGNOSIS — Z0000 Encounter for general adult medical examination without abnormal findings: Secondary | ICD-10-CM

## 2018-01-11 LAB — HEMOGLOBIN A1C: Hgb A1c MFr Bld: 6.1 % (ref 4.6–6.5)

## 2018-01-11 LAB — LIPID PANEL
CHOL/HDL RATIO: 3
Cholesterol: 119 mg/dL (ref 0–200)
HDL: 38.3 mg/dL — ABNORMAL LOW (ref >39.00)
LDL Cholesterol: 47 mg/dL (ref 0–99)
NonHDL: 80.77
Triglycerides: 171 mg/dL — ABNORMAL HIGH (ref 0.0–149.0)
VLDL: 34.2 mg/dL (ref 0.0–40.0)

## 2018-01-11 LAB — COMPREHENSIVE METABOLIC PANEL
ALT: 47 U/L (ref 0–53)
AST: 34 U/L (ref 0–37)
Albumin: 4.9 g/dL (ref 3.5–5.2)
Alkaline Phosphatase: 61 U/L (ref 39–117)
BUN: 15 mg/dL (ref 6–23)
CO2: 26 mEq/L (ref 19–32)
Calcium: 9.6 mg/dL (ref 8.4–10.5)
Chloride: 103 mEq/L (ref 96–112)
Creatinine, Ser: 0.88 mg/dL (ref 0.40–1.50)
GFR: 91.63 mL/min (ref >60.00)
Glucose, Bld: 127 mg/dL — ABNORMAL HIGH (ref 70–99)
Potassium: 4.4 mEq/L (ref 3.5–5.1)
Sodium: 140 mEq/L (ref 135–145)
Total Bilirubin: 1.3 mg/dL — ABNORMAL HIGH (ref 0.2–1.2)
Total Protein: 7.5 g/dL (ref 6.0–8.3)

## 2018-01-11 MED ORDER — MUPIROCIN 2 % EX OINT: 1.0000 "application " | TOPICAL_OINTMENT | Freq: Two times a day (BID) | CUTANEOUS | 0 refills | Status: DC

## 2018-01-11 NOTE — Assessment & Plan Note (Signed)
bp slightly elevated today but overall controlled - he monitors it at home BP controlled Current regimen effective and well tolerated Continue current medications at current doses cmp

## 2018-01-11 NOTE — Assessment & Plan Note (Signed)
Check lipid panel  Continue daily statin Regular exercise and healthy diet encouraged  

## 2018-01-11 NOTE — Assessment & Plan Note (Signed)
No chest pain, palps, sob Continue current medications Lipid, cmp

## 2018-01-11 NOTE — Patient Instructions (Addendum)
Continue doing brain stimulating activities (puzzles, reading, adult coloring books, staying active) to keep memory sharp.   Continue to eat heart healthy diet (full of fruits, vegetables, whole grains, lean protein, water--limit salt, fat, and sugar intake) and increase physical activity as tolerated.   Mike Ray , Thank you for taking time to come for your Medicare Wellness Visit. I appreciate your ongoing commitment to your health goals. Please review the following plan we discussed and let me know if I can assist you in the future.   These are the goals we discussed: Goals    . Patient Stated     I would like to lose weight with a goal of 205 pounds. I will do this by continuing to swim, and monitor the amount of sugar and carbohydrates in my diet.     . Patient Stated     Lose weight to the goal of 200 Pounds. Once I get over my hernia surgery, I will begin swimming again and monitor my diet closely.       This is a list of the screening recommended for you and due dates:  Health Maintenance  Topic Date Due  . Colon Cancer Screening  01/10/2020  . Tetanus Vaccine  05/30/2022  . Flu Shot  Completed  .  Hepatitis C: One time screening is recommended by Center for Disease Control  (CDC) for  adults born from [birth year] through 1965.   Completed  . Pneumonia vaccines  Completed     Health Maintenance, Male A healthy lifestyle and preventive care is important for your health and wellness. Ask your health care provider about what schedule of regular examinations is right for you. What should I know about weight and diet? Eat a Healthy Diet  Eat plenty of vegetables, fruits, whole grains, low-fat dairy products, and lean protein.  Do not eat a lot of foods high in solid fats, added sugars, or salt.  Maintain a Healthy Weight Regular exercise can help you achieve or maintain a healthy weight. You should:  Do at least 150 minutes of exercise each week. The exercise should increase  your heart rate and make you sweat (moderate-intensity exercise).  Do strength-training exercises at least twice a week. Watch Your Levels of Cholesterol and Blood Lipids  Have your blood tested for lipids and cholesterol every 5 years starting at 67 years of age. If you are at high risk for heart disease, you should start having your blood tested when you are 67 years old. You may need to have your cholesterol levels checked more often if: ? Your lipid or cholesterol levels are high. ? You are older than 67 years of age. ? You are at high risk for heart disease. What should I know about cancer screening? Many types of cancers can be detected early and may often be prevented. Lung Cancer  You should be screened every year for lung cancer if: ? You are a current smoker who has smoked for at least 30 years. ? You are a former smoker who has quit within the past 15 years.  Talk to your health care provider about your screening options, when you should start screening, and how often you should be screened. Colorectal Cancer  Routine colorectal cancer screening usually begins at 67 years of age and should be repeated every 5-10 years until you are 67 years old. You may need to be screened more often if early forms of precancerous polyps or small growths are found. Your health  care provider may recommend screening at an earlier age if you have risk factors for colon cancer.  Your health care provider may recommend using home test kits to check for hidden blood in the stool.  A small camera at the end of a tube can be used to examine your colon (sigmoidoscopy or colonoscopy). This checks for the earliest forms of colorectal cancer. Prostate and Testicular Cancer  Depending on your age and overall health, your health care provider may do certain tests to screen for prostate and testicular cancer.  Talk to your health care provider about any symptoms or concerns you have about testicular or  prostate cancer. Skin Cancer  Check your skin from head to toe regularly.  Tell your health care provider about any new moles or changes in moles, especially if: ? There is a change in a mole's size, shape, or color. ? You have a mole that is larger than a pencil eraser.  Always use sunscreen. Apply sunscreen liberally and repeat throughout the day.  Protect yourself by wearing long sleeves, pants, a wide-brimmed hat, and sunglasses when outside. What should I know about heart disease, diabetes, and high blood pressure?  If you are 45-33 years of age, have your blood pressure checked every 3-5 years. If you are 17 years of age or older, have your blood pressure checked every year. You should have your blood pressure measured twice-once when you are at a hospital or clinic, and once when you are not at a hospital or clinic. Record the average of the two measurements. To check your blood pressure when you are not at a hospital or clinic, you can use: ? An automated blood pressure machine at a pharmacy. ? A home blood pressure monitor.  Talk to your health care provider about your target blood pressure.  If you are between 51-26 years old, ask your health care provider if you should take aspirin to prevent heart disease.  Have regular diabetes screenings by checking your fasting blood sugar level. ? If you are at a normal weight and have a low risk for diabetes, have this test once every three years after the age of 16. ? If you are overweight and have a high risk for diabetes, consider being tested at a younger age or more often.  A one-time screening for abdominal aortic aneurysm (AAA) by ultrasound is recommended for men aged 75-75 years who are current or former smokers. What should I know about preventing infection? Hepatitis B If you have a higher risk for hepatitis B, you should be screened for this virus. Talk with your health care provider to find out if you are at risk for  hepatitis B infection. Hepatitis C Blood testing is recommended for:  Everyone born from [birth year] through 1965.  Anyone with known risk factors for hepatitis C. Sexually Transmitted Diseases (STDs)  You should be screened each year for STDs including gonorrhea and chlamydia if: ? You are sexually active and are younger than 67 years of age. ? You are older than 67 years of age and your health care provider tells you that you are at risk for this type of infection. ? Your sexual activity has changed since you were last screened and you are at an increased risk for chlamydia or gonorrhea. Ask your health care provider if you are at risk.  Talk with your health care provider about whether you are at high risk of being infected with HIV. Your health care provider may recommend  a prescription medicine to help prevent HIV infection. What else can I do?  Schedule regular health, dental, and eye exams.  Stay current with your vaccines (immunizations).  Do not use any tobacco products, such as cigarettes, chewing tobacco, and e-cigarettes. If you need help quitting, ask your health care provider.  Limit alcohol intake to no more than 2 drinks per day. One drink equals 12 ounces of beer, 5 ounces of Arrin Ishler, or 1 ounces of hard liquor.  Do not use street drugs.  Do not share needles.  Ask your health care provider for help if you need support or information about quitting drugs.  Tell your health care provider if you often feel depressed.  Tell your health care provider if you have ever been abused or do not feel safe at home. This information is not intended to replace advice given to you by your health care provider. Make sure you discuss any questions you have with your health care provider. Document Released: 07/10/2007 Document Revised: 09/10/2015 Document Reviewed: 10/15/2014 Elsevier Interactive Patient Education  2019 [employer].

## 2018-01-11 NOTE — Assessment & Plan Note (Signed)
Check a1c Low sugar / carb diet Stressed regular exercise   

## 2018-01-12 ENCOUNTER — Encounter: Payer: Self-pay | Admitting: Internal Medicine

## 2018-01-30 IMAGING — CR DG CHEST 1V PORT
2 series · 2 of 2 positions shown · non-contrast
Comparison: Portable exam 9222 hours compared to 01/02/2007

CLINICAL DATA: Chest pain at rest, history coronary artery disease
post stent placement

EXAM:
PORTABLE CHEST 1 VIEW

[AP (1 of 2)]
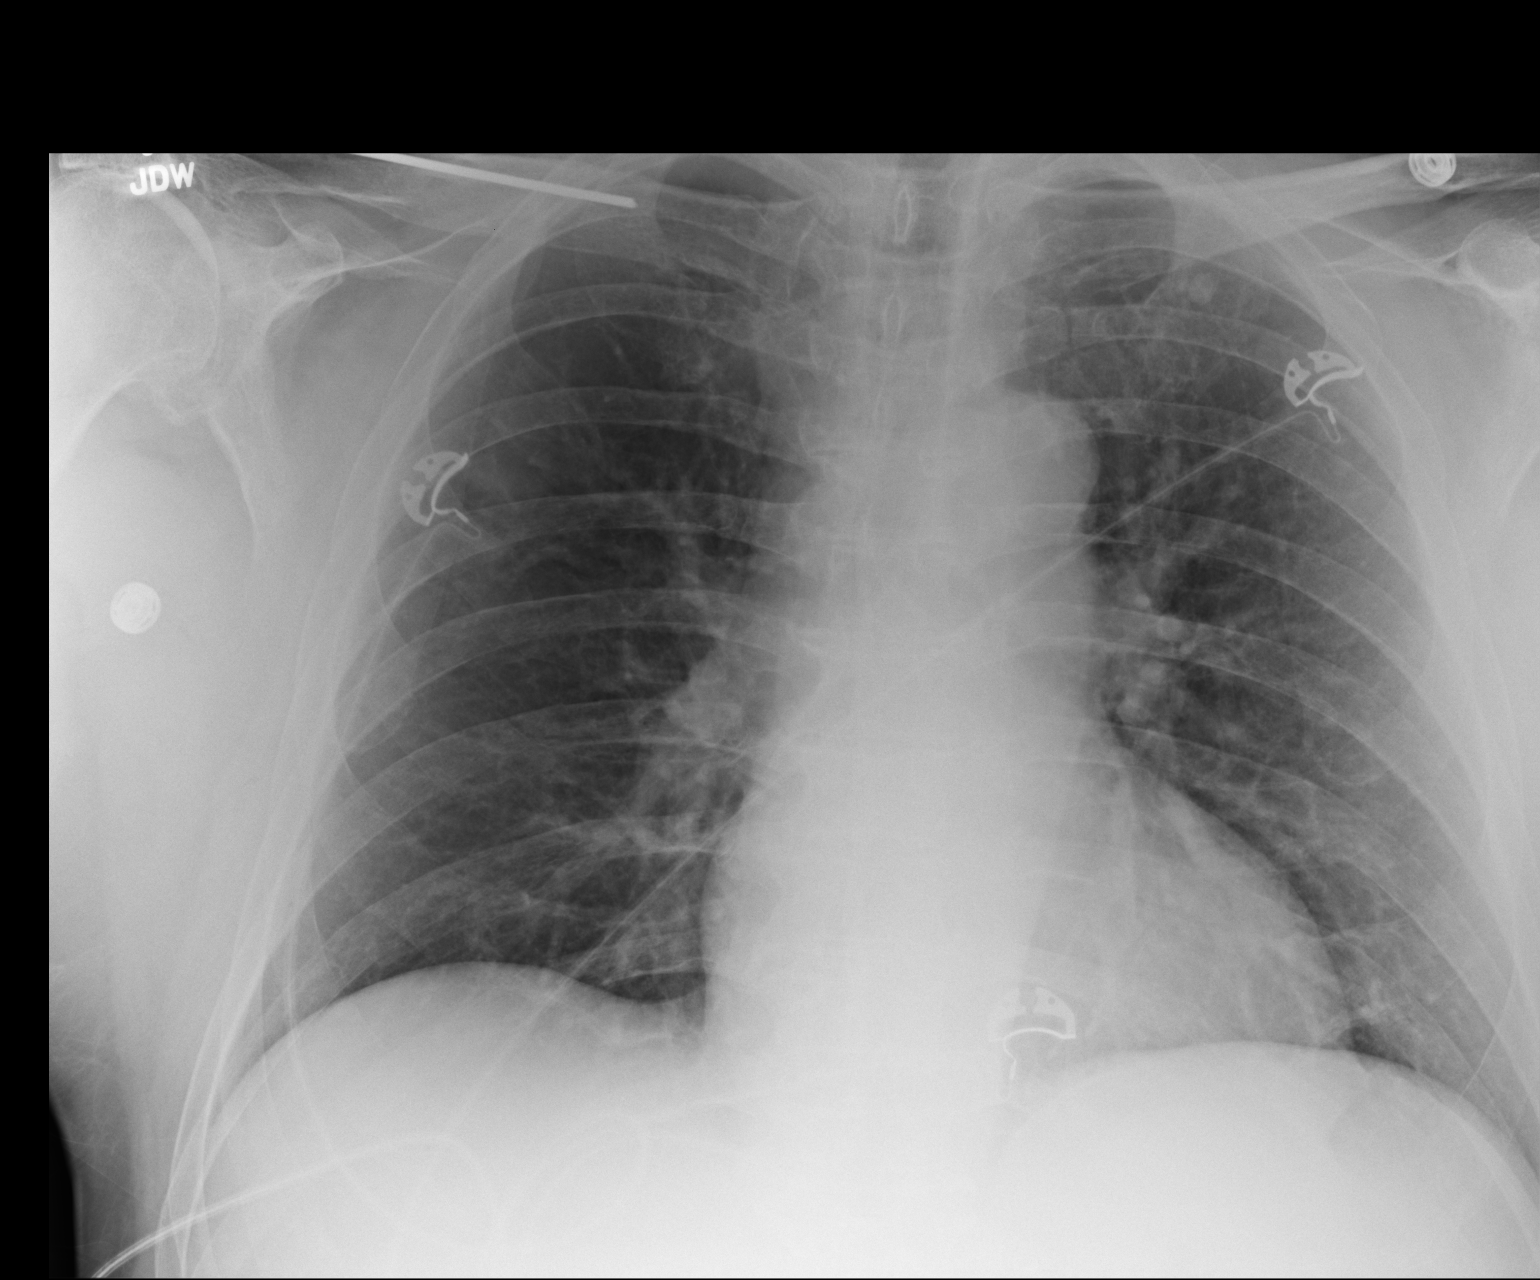

[AP (2 of 2)]
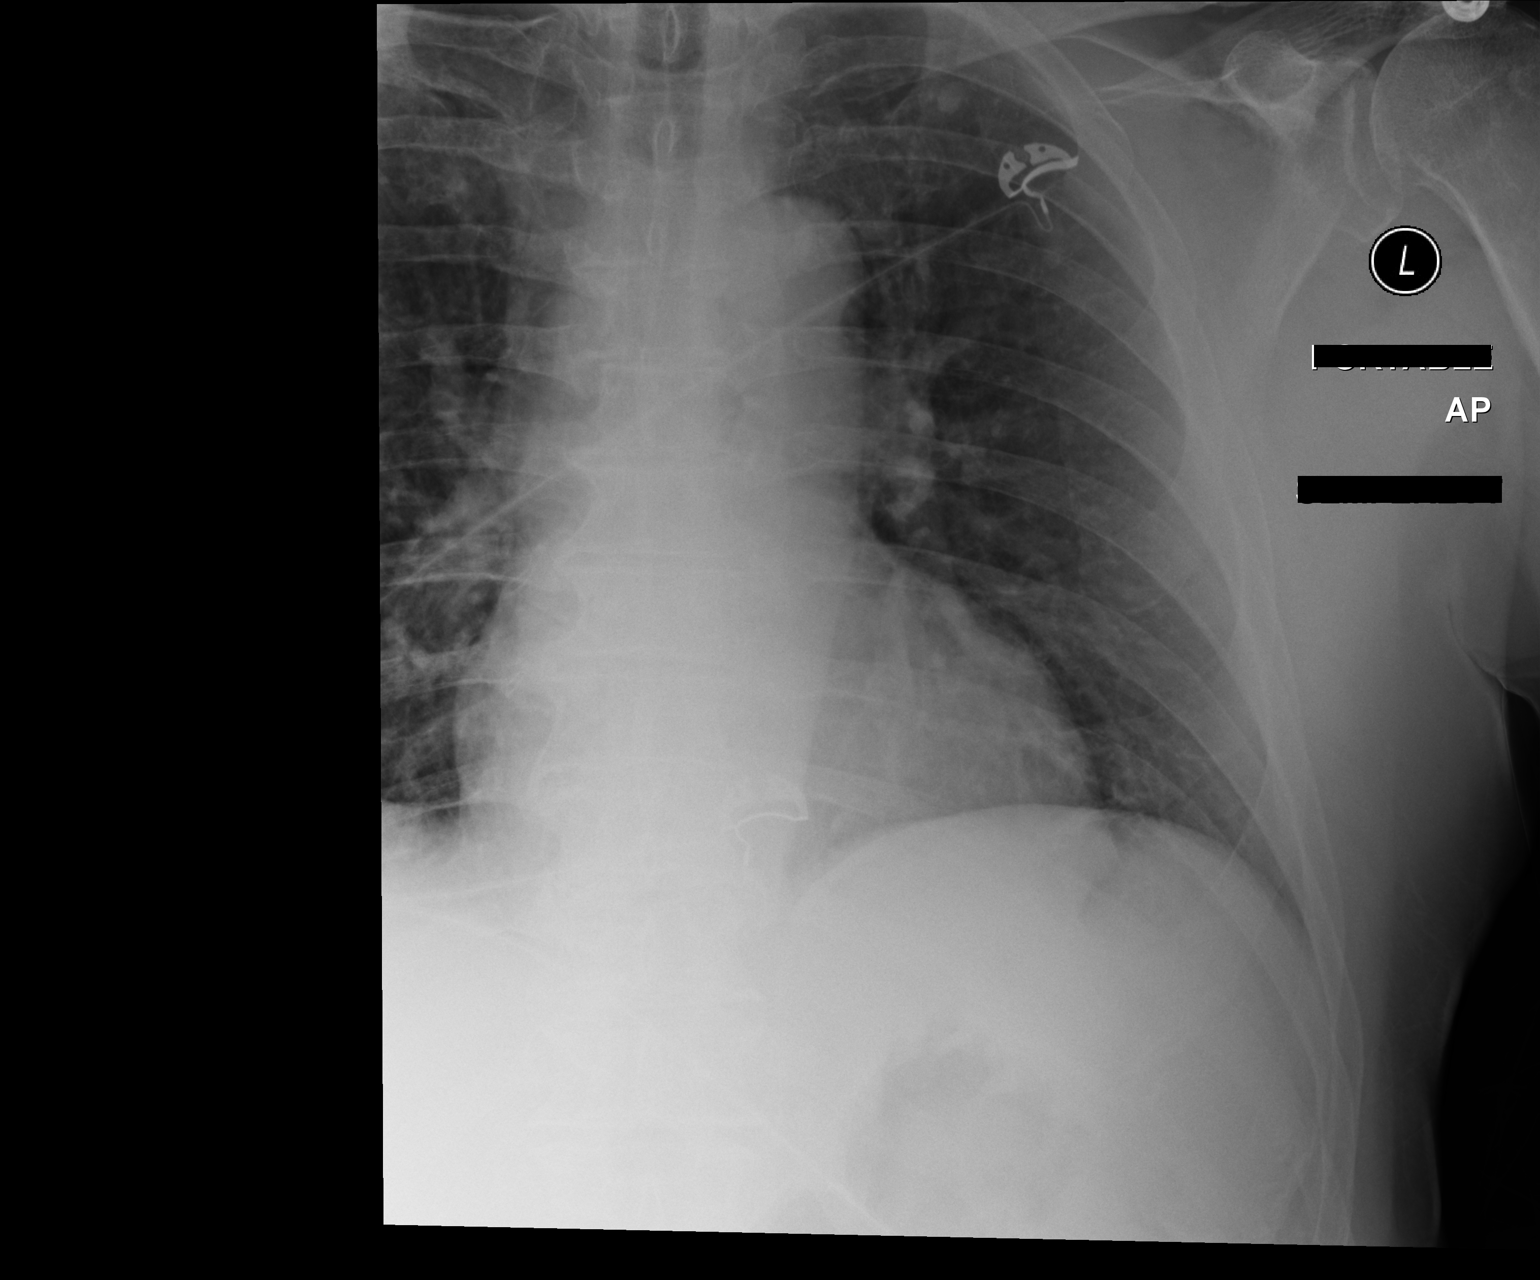

[2 of 2 positions shown; findings below may reference images not displayed]

FINDINGS: Normal heart size, mediastinal contours, and pulmonary vascularity.

Lungs clear.

No pleural effusion or pneumothorax.

Calcified granuloma LEFT upper lobe versus bone island at anterior
LEFT first rib.

No acute osseous findings.

Prior K-wire fixation of RIGHT clavicle.

RIGHT glenohumeral degenerative changes and chronic rotator cuff
tear.
IMPRESSION: No acute abnormalities.

## 2018-03-04 IMAGING — CR DG CHEST 2V
2 series · 2 of 2 positions shown · non-contrast
Comparison: 07/30/2015.

CLINICAL DATA: Cough, wheezing and chest congestion for the past 4
days. Previous coronary artery stent placement. Ex-smoker.

EXAM:
CHEST  2 VIEW

[w chest pa]
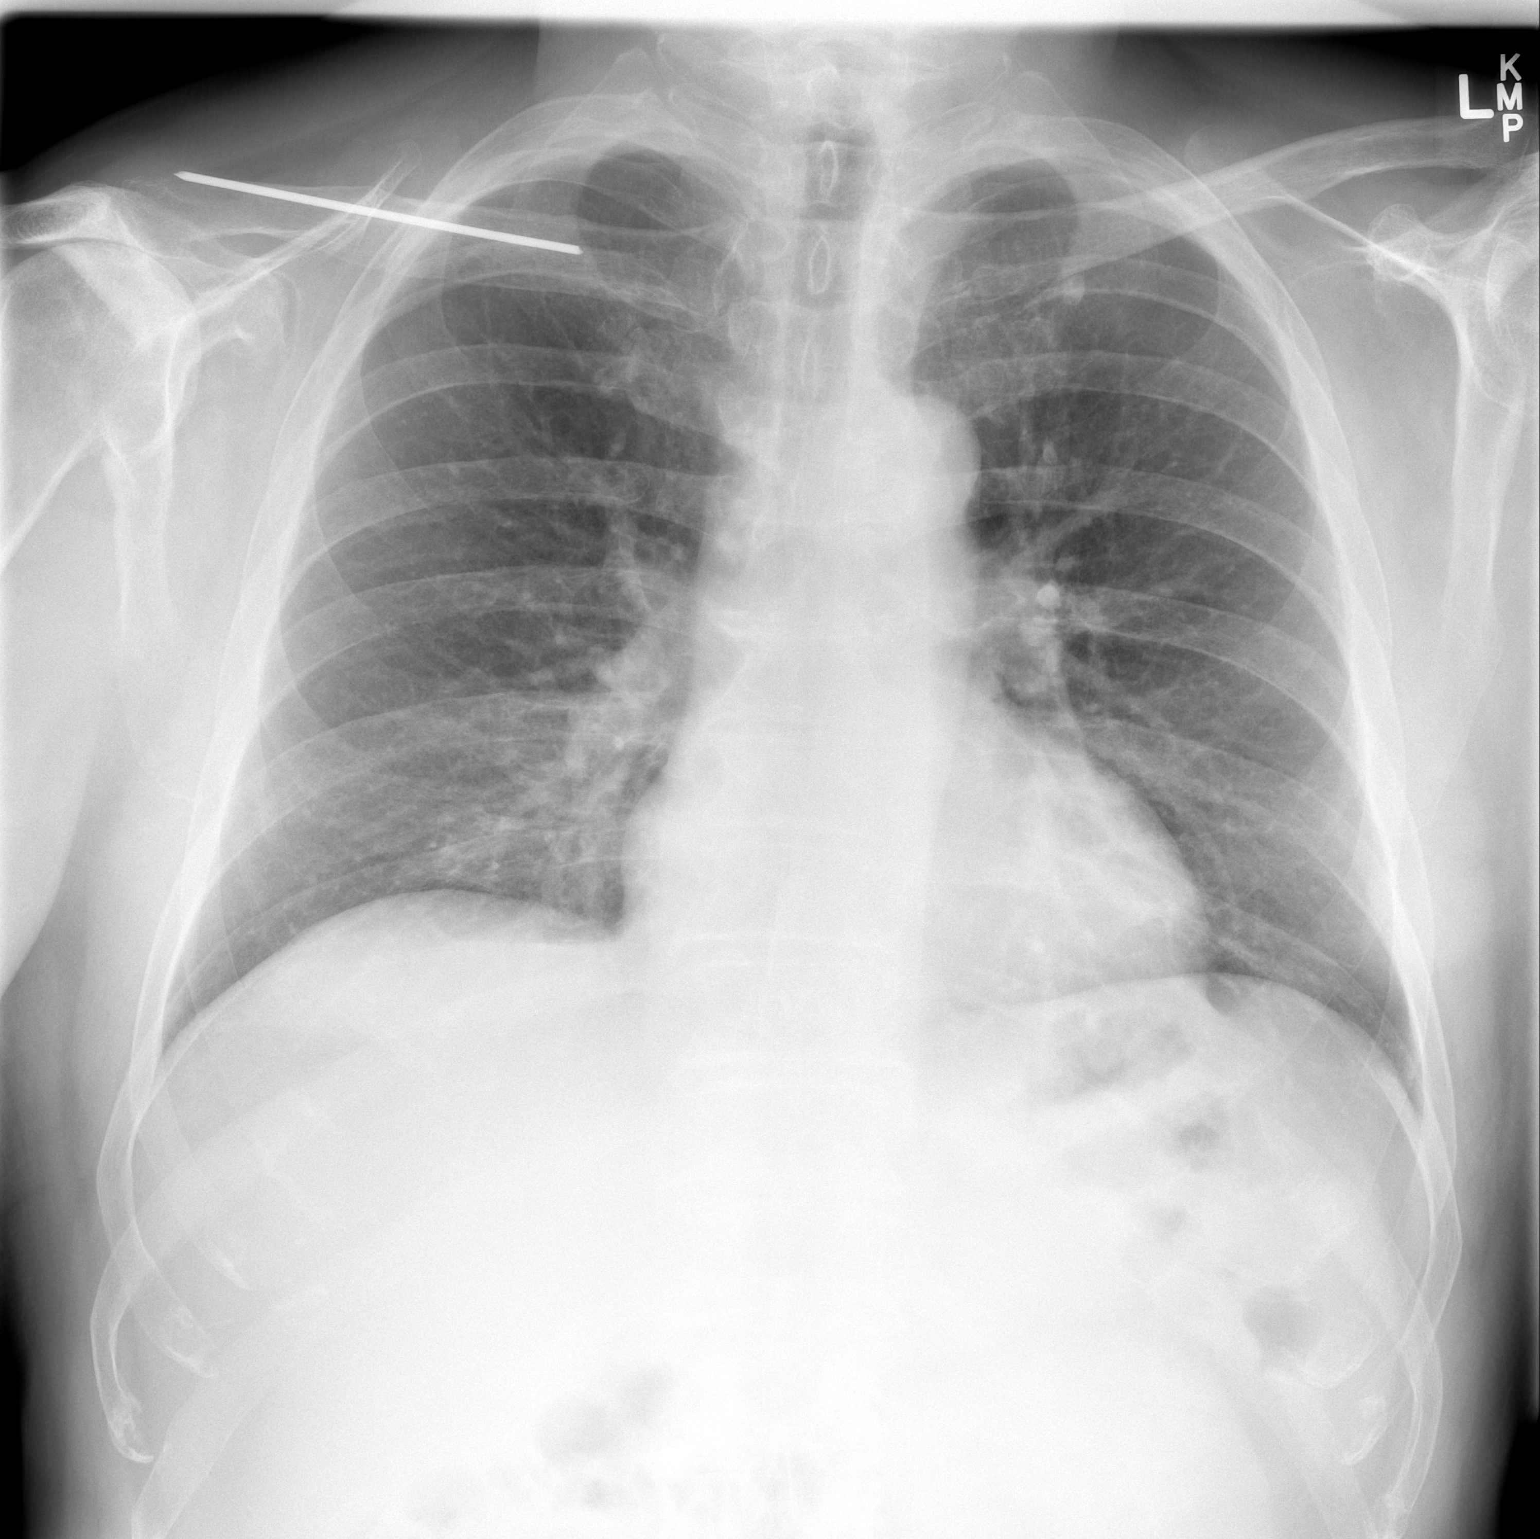

[w chest lat]
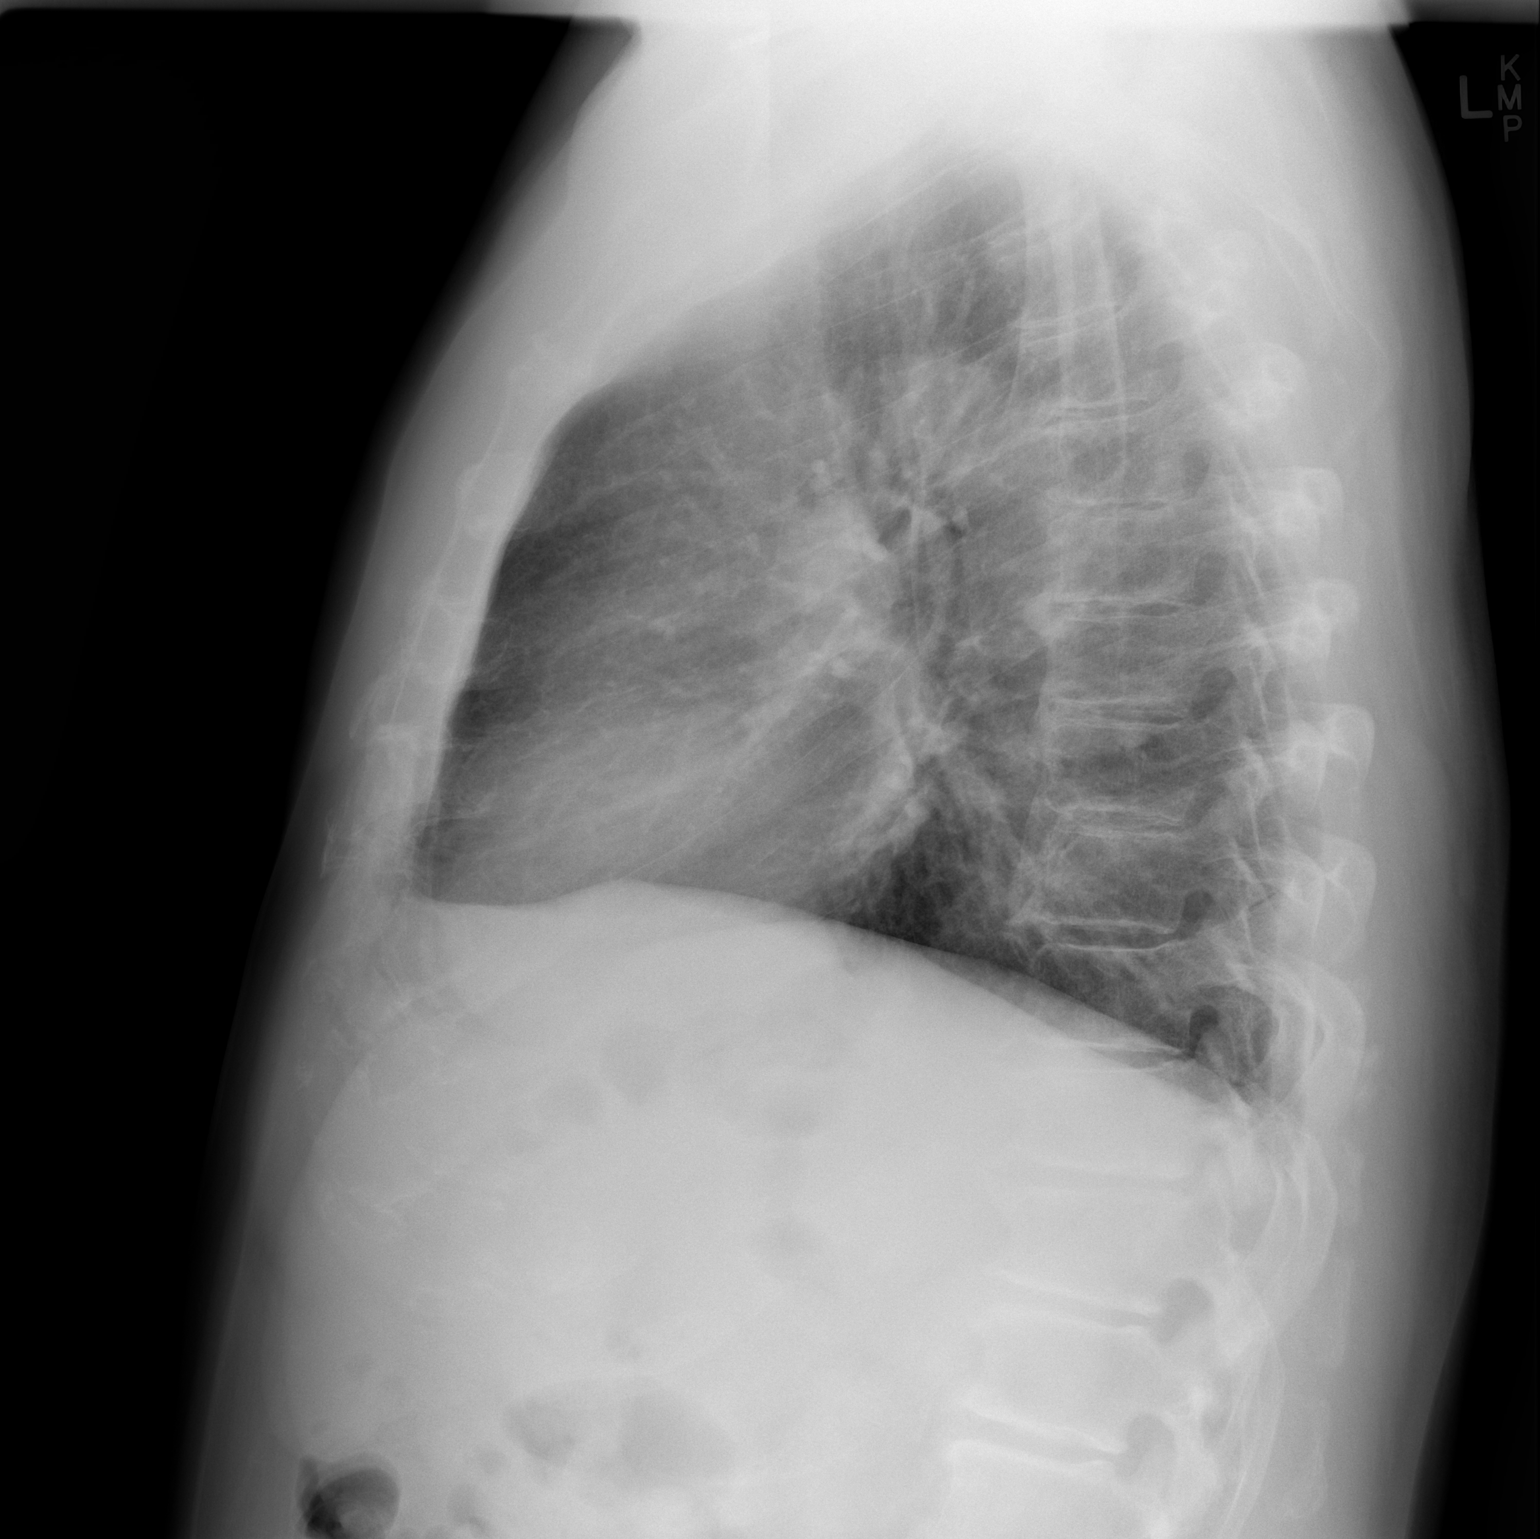

[2 of 2 positions shown; findings below may reference images not displayed]

FINDINGS: The heart remains normal in size. A left apical calcified granuloma
is unchanged. Clear lungs with normal vascularity. K-wire fixation
of the right clavicle is stable. Thoracic spine degenerative
changes.
IMPRESSION: No acute abnormality.

## 2018-04-04 DIAGNOSIS — Z08 Encounter for follow-up examination after completed treatment for malignant neoplasm: Secondary | ICD-10-CM | POA: Diagnosis not present

## 2018-04-04 DIAGNOSIS — C44319 Basal cell carcinoma of skin of other parts of face: Secondary | ICD-10-CM | POA: Diagnosis not present

## 2018-04-04 DIAGNOSIS — L821 Other seborrheic keratosis: Secondary | ICD-10-CM | POA: Diagnosis not present

## 2018-04-04 DIAGNOSIS — L570 Actinic keratosis: Secondary | ICD-10-CM | POA: Diagnosis not present

## 2018-04-04 DIAGNOSIS — D485 Neoplasm of uncertain behavior of skin: Secondary | ICD-10-CM | POA: Diagnosis not present

## 2018-04-04 DIAGNOSIS — Z85828 Personal history of other malignant neoplasm of skin: Secondary | ICD-10-CM | POA: Diagnosis not present

## 2018-04-04 DIAGNOSIS — L578 Other skin changes due to chronic exposure to nonionizing radiation: Secondary | ICD-10-CM | POA: Diagnosis not present

## 2018-04-04 DIAGNOSIS — L905 Scar conditions and fibrosis of skin: Secondary | ICD-10-CM | POA: Diagnosis not present

## 2018-04-04 DIAGNOSIS — B078 Other viral warts: Secondary | ICD-10-CM | POA: Diagnosis not present

## 2018-05-17 ENCOUNTER — Other Ambulatory Visit: Payer: Self-pay | Admitting: Internal Medicine

## 2018-05-25 ENCOUNTER — Other Ambulatory Visit: Payer: Self-pay | Admitting: Internal Medicine

## 2018-06-13 ENCOUNTER — Other Ambulatory Visit: Payer: Self-pay | Admitting: Internal Medicine

## 2018-06-13 MED ORDER — MUPIROCIN 2 % EX OINT: 1.0000 "application " | TOPICAL_OINTMENT | Freq: Two times a day (BID) | CUTANEOUS | 0 refills | Status: DC

## 2018-07-21 ENCOUNTER — Ambulatory Visit: Payer: Medicare Other | Admitting: Internal Medicine

## 2018-07-23 NOTE — Progress Notes (Signed)
Subjective:    Patient ID: Mike Ray, male    DOB: 03/19/1950, 68 y.o.   MRN: [id number]  HPI The patient is here for follow up.  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     CAD, Hypertension: He is taking his medication daily. He is compliant with a low sodium diet.  He denies chest pain, palpitations, edema, shortness of breath and regular headaches.  He does not monitor his blood pressure at home.    Hyperlipidemia: He is taking his medication daily. He is compliant with a low fat/cholesterol diet. He denies myalgias.   Prediabetes:  He is compliant with a low sugar/carbohydrate diet.  He is very active, but not exercising regularly.   He will be having surgery on his forehead for skin cancer.     Medications and allergies reviewed with patient and updated if appropriate.  Patient Active Problem List   Diagnosis Date Noted  . Non-recurrent unilateral inguinal hernia without obstruction or gangrene 09/12/2017  . Prediabetes 07/04/2016  . CAD (coronary artery disease) 01/04/2016  . History of ST elevation myocardial infarction (STEMI) 01/04/2016  . NEPHROLITHIASIS, HX OF 10/22/2008  . CHOLELITHIASIS 11/08/2007  . Essential hypertension 10/17/2007  . HYPERLIPIDEMIA 12/13/2006  . ELEVATED PROSTATE SPECIFIC ANTIGEN 09/16/2006    Current Outpatient Medications on File Prior to Visit  Medication Sig Dispense Refill  . aspirin 81 MG tablet Take 81 mg by mouth daily.    Marland Kitchen atorvastatin (LIPITOR) 80 MG tablet TAKE 1 TABLET(80 MG) BY MOUTH DAILY AT 6 PM 90 tablet 1  . fish oil-omega-3 fatty acids 1000 MG capsule Take 1 g by mouth every other day.     . losartan (COZAAR) 100 MG tablet Take 1 tablet (100 mg total) by mouth daily. 90 tablet 3  . metoprolol tartrate (LOPRESSOR) 25 MG tablet Take 1/2 (one-half) tablet by mouth twice daily 90 tablet 0  . Multiple Vitamin (MULTIVITAMIN) tablet Take 1 tablet by mouth every other day.     .  mupirocin ointment (BACTROBAN) 2 % Place 1 application into the nose 2 (two) times daily. 22 g 0  . nitroGLYCERIN (NITROSTAT) 0.4 MG SL tablet Place 1 tablet (0.4 mg total) under the tongue every 5 (five) minutes x 3 doses as needed for chest pain. 25 tablet 5   No current facility-administered medications on file prior to visit.     Past Medical History:  Diagnosis Date  . Acute MI, inferolateral wall, initial episode of care ([place]) 07/30/2015  . Coronary artery disease   . Elevated PSA    Dr Barnie Del  . Gilbert's syndrome   . History of heat stroke 2009  . History of nephrolithiasis   . Hyperlipidemia    NMR 06/2009: LDL 34(9179/150), HDL 40, TG 127. Framingham Study LDL goal =< 130   . Hypertension   . Osteoarthritis of left hip     Past Surgical History:  Procedure Laterality Date  . CARDIAC CATHETERIZATION    . CARDIAC CATHETERIZATION N/A 07/30/2015   Procedure: Left Heart Cath and Coronary Angiography;  Surgeon: Sherren Mocha, MD;  Location: [facility];  Service: Cardiovascular;  Laterality: N/A;  . CARDIAC CATHETERIZATION N/A 07/30/2015   Procedure: Coronary Stent Intervention;  Surgeon: Sherren Mocha, MD;  Location: [facility];  Service: Cardiovascular;  Laterality: N/A;  Distal RCA- Promus 3.50x16  . COLONOSCOPY  2010   negative  . INGUINAL HERNIA REPAIR Right 10/19/2017  Procedure: RIGHT  INGUINAL HERNIA REPAIR ERAS PATHWAY;  Surgeon: Erroll Luna, MD;  Location: [facility];  Service: General;  Laterality: Right;  Tap block  . INSERTION OF MESH Right 10/19/2017   Procedure: INSERTION OF MESH;  Surgeon: Erroll Luna, MD;  Location: [facility];  Service: General;  Laterality: Right;  . LITHOTRIPSY  2009   Dr.Duckett, [place]   . TOTAL HIP ARTHROPLASTY  2008   left  . TOTAL HIP ARTHROPLASTY  2004   right    Social History   Socioeconomic History  . Marital status: Married    Spouse name: Not on file  . Number of  children: 3  . Years of education: Not on file  . Highest education level: Not on file  Occupational History  . Occupation: Museum/gallery curator  . Financial resource strain: Not hard at all  . Food insecurity    Worry: Never true    Inability: Never true  . Transportation needs    Medical: No    Non-medical: No  Tobacco Use  . Smoking status: Former Smoker    Quit date: 01/26/2000    Years since quitting: 18.5  . Smokeless tobacco: Never Used  . Tobacco comment: Age 78  Substance and Sexual Activity  . Alcohol use: Yes    Comment: 15 beers/week  . Drug use: No  . Sexual activity: Yes    Birth control/protection: Other-see comments  Lifestyle  . Physical activity    Days per week: 0 days    Minutes per session: 0 min  . Stress: To some extent  Relationships  . Social connections    Talks on phone: More than three times a week    Gets together: More than three times a week    Attends religious service: More than 4 times per year    Active member of club or organization: Yes    Attends meetings of clubs or organizations: More than 4 times per year    Relationship status: Married  Other Topics Concern  . Not on file  Social History Narrative  . Not on file    Family History  Problem Relation Age of Onset  . Diabetes Mother   . Hypertension Mother   . Heart attack Father 45       smoker  . Prostate cancer Brother 48       CAD; stents @ 26  . Hypertension Brother   . Heart attack Brother 33  . Coronary artery disease Brother        stents late 20s  . Stroke Neg Hx     Review of Systems  Constitutional: Negative for chills and fever.  Respiratory: Positive for cough (reminds him of when he was on lisinopril). Negative for shortness of breath and wheezing.   Cardiovascular: Negative for chest pain, palpitations and leg swelling.  Gastrointestinal:       GERD occasionally  Neurological: Negative for light-headedness and headaches.       Objective:    Vitals:   07/24/18 0903  BP: (!) 142/86  Pulse: 70  Resp: 16  Temp: 97.7 F (36.5 C)  SpO2: 97%   BP Readings from Last 3 Encounters:  07/24/18 (!) 142/86  01/11/18 (!) 144/88  01/11/18 (!) 144/88   Wt Readings from Last 3 Encounters:  07/24/18 221 lb 6.4 oz (100.4 kg)  01/11/18 232 lb (105.2 kg)  01/11/18 232 lb (105.2 kg)   Body mass index is  30.88 kg/m.   Physical Exam    Constitutional: Appears well-developed and well-nourished. No distress.  HENT:  Head: Normocephalic and atraumatic.  Neck: Neck supple. No tracheal deviation present. No thyromegaly present.  No cervical lymphadenopathy Cardiovascular: Normal rate, regular rhythm and normal heart sounds.   No murmur heard. No carotid bruit .  No edema Pulmonary/Chest: Effort normal and breath sounds normal. No respiratory distress. No has no wheezes. No rales.  Skin: Skin is warm and dry. Not diaphoretic.  Psychiatric: Normal mood and affect. Behavior is normal.      Assessment & Plan:    See Problem List for Assessment and Plan of chronic medical problems.

## 2018-07-23 NOTE — Patient Instructions (Addendum)
  Tests ordered today. Your results will be released to [facility] (or called to you) after review, usually within 72hours after test completion. If any changes need to be made, you will be notified at that same time.   Medications reviewed and updated.  Changes include :    Increasing amlodipine to 10 mg daily.    Your prescription(s) have been submitted to your pharmacy. Please take as directed and contact our office if you believe you are having problem(s) with the medication(s).    Please followup in 6 months

## 2018-07-24 ENCOUNTER — Ambulatory Visit (INDEPENDENT_AMBULATORY_CARE_PROVIDER_SITE_OTHER): Payer: Medicare Other | Admitting: Internal Medicine

## 2018-07-24 ENCOUNTER — Other Ambulatory Visit: Payer: Self-pay

## 2018-07-24 ENCOUNTER — Other Ambulatory Visit (INDEPENDENT_AMBULATORY_CARE_PROVIDER_SITE_OTHER): Payer: Medicare Other

## 2018-07-24 ENCOUNTER — Encounter: Payer: Self-pay | Admitting: Internal Medicine

## 2018-07-24 VITALS — BP 142/86 | HR 70 | Temp 97.7°F | Resp 16 | Ht 71.0 in | Wt 221.4 lb

## 2018-07-24 DIAGNOSIS — I10 Essential (primary) hypertension: Secondary | ICD-10-CM

## 2018-07-24 DIAGNOSIS — I2510 Atherosclerotic heart disease of native coronary artery without angina pectoris: Secondary | ICD-10-CM

## 2018-07-24 DIAGNOSIS — Z125 Encounter for screening for malignant neoplasm of prostate: Secondary | ICD-10-CM | POA: Diagnosis not present

## 2018-07-24 DIAGNOSIS — E782 Mixed hyperlipidemia: Secondary | ICD-10-CM | POA: Diagnosis not present

## 2018-07-24 DIAGNOSIS — R7303 Prediabetes: Secondary | ICD-10-CM

## 2018-07-24 DIAGNOSIS — R972 Elevated prostate specific antigen [PSA]: Secondary | ICD-10-CM | POA: Diagnosis not present

## 2018-07-24 DIAGNOSIS — C4490 Unspecified malignant neoplasm of skin, unspecified: Secondary | ICD-10-CM

## 2018-07-24 LAB — CBC WITH DIFFERENTIAL/PLATELET
Basophils Absolute: 0 10*3/uL (ref 0.0–0.1)
Basophils Relative: 0.7 % (ref 0.0–3.0)
Eosinophils Absolute: 0.1 10*3/uL (ref 0.0–0.7)
Eosinophils Relative: 1.8 % (ref 0.0–5.0)
HCT: 44.9 % (ref 39.0–52.0)
Hemoglobin: 15.5 g/dL (ref 13.0–17.0)
Lymphocytes Relative: 29.3 % (ref 12.0–46.0)
Lymphs Abs: 1.7 10*3/uL (ref 0.7–4.0)
MCHC: 34.5 g/dL (ref 30.0–36.0)
MCV: 94.1 fl (ref 78.0–100.0)
Monocytes Absolute: 0.5 10*3/uL (ref 0.1–1.0)
Monocytes Relative: 8 % (ref 3.0–12.0)
Neutro Abs: 3.5 10*3/uL (ref 1.4–7.7)
Neutrophils Relative %: 60.2 % (ref 43.0–77.0)
Platelets: 226 10*3/uL (ref 150.0–400.0)
RBC: 4.77 Mil/uL (ref 4.22–5.81)
RDW: 12.6 % (ref 11.5–15.5)
WBC: 5.9 10*3/uL (ref 4.0–10.5)

## 2018-07-24 LAB — LIPID PANEL
Cholesterol: 97 mg/dL (ref 0–200)
HDL: 38.6 mg/dL — ABNORMAL LOW (ref >39.00)
LDL Cholesterol: 33 mg/dL (ref 0–99)
NonHDL: 58.56
Total CHOL/HDL Ratio: 3
Triglycerides: 126 mg/dL (ref 0.0–149.0)
VLDL: 25.2 mg/dL (ref 0.0–40.0)

## 2018-07-24 LAB — TSH: TSH: 1.43 u[IU]/mL (ref 0.35–4.50)

## 2018-07-24 LAB — COMPREHENSIVE METABOLIC PANEL
ALT: 26 U/L (ref 0–53)
AST: 24 U/L (ref 0–37)
Albumin: 4.7 g/dL (ref 3.5–5.2)
Alkaline Phosphatase: 66 U/L (ref 39–117)
BUN: 15 mg/dL (ref 6–23)
CO2: 23 mEq/L (ref 19–32)
Calcium: 9.4 mg/dL (ref 8.4–10.5)
Chloride: 103 mEq/L (ref 96–112)
Creatinine, Ser: 0.89 mg/dL (ref 0.40–1.50)
GFR: 84.96 mL/min (ref >60.00)
Glucose, Bld: 117 mg/dL — ABNORMAL HIGH (ref 70–99)
Potassium: 4.8 mEq/L (ref 3.5–5.1)
Sodium: 138 mEq/L (ref 135–145)
Total Bilirubin: 1.1 mg/dL (ref 0.2–1.2)
Total Protein: 7 g/dL (ref 6.0–8.3)

## 2018-07-24 LAB — PSA, MEDICARE: PSA: 2.02 ng/ml (ref 0.10–4.00)

## 2018-07-24 LAB — HEMOGLOBIN A1C: Hgb A1c MFr Bld: 5.9 % (ref 4.6–6.5)

## 2018-07-24 MED ORDER — AMLODIPINE BESYLATE 10 MG PO TABS: 10.0000 mg | ORAL_TABLET | Freq: Every day | ORAL | 3 refills | Status: DC

## 2018-07-24 NOTE — Assessment & Plan Note (Signed)
Check a1c Low sugar / carb diet Stressed regular exercise   

## 2018-07-24 NOTE — Assessment & Plan Note (Signed)
Blood pressure slightly higher than ideal Increase amlodipine to 10 mg daily Continue losartan and metoprolol at current doses CMP, TSH, CBC

## 2018-07-24 NOTE — Assessment & Plan Note (Signed)
Recheck PSA-screening for prostate cancer His brother did have prostate cancer

## 2018-07-24 NOTE — Assessment & Plan Note (Signed)
Check lipid panel, CMP, TSH Continue daily statin Regular exercise and healthy diet encouraged  

## 2018-07-24 NOTE — Assessment & Plan Note (Signed)
Forehead-to have Mohs surgery July 2020

## 2018-07-24 NOTE — Assessment & Plan Note (Signed)
No chest pain, shortness of breath Taking an aspirin, statin and metoprolol Continue current medication Check labs-CMP, lipid panel, TSH

## 2018-07-25 ENCOUNTER — Encounter: Payer: Self-pay | Admitting: Internal Medicine

## 2018-08-01 DIAGNOSIS — C44319 Basal cell carcinoma of skin of other parts of face: Secondary | ICD-10-CM | POA: Diagnosis not present

## 2018-08-14 ENCOUNTER — Other Ambulatory Visit: Payer: Self-pay | Admitting: Internal Medicine

## 2018-10-05 ENCOUNTER — Ambulatory Visit (INDEPENDENT_AMBULATORY_CARE_PROVIDER_SITE_OTHER): Payer: Medicare Other | Admitting: Cardiology

## 2018-10-05 ENCOUNTER — Encounter: Payer: Self-pay | Admitting: Cardiology

## 2018-10-05 ENCOUNTER — Other Ambulatory Visit: Payer: Self-pay

## 2018-10-05 VITALS — BP 128/70 | HR 67 | Ht 71.0 in | Wt 224.1 lb

## 2018-10-05 DIAGNOSIS — I10 Essential (primary) hypertension: Secondary | ICD-10-CM | POA: Diagnosis not present

## 2018-10-05 DIAGNOSIS — E785 Hyperlipidemia, unspecified: Secondary | ICD-10-CM

## 2018-10-05 DIAGNOSIS — I2510 Atherosclerotic heart disease of native coronary artery without angina pectoris: Secondary | ICD-10-CM | POA: Diagnosis not present

## 2018-10-05 NOTE — Patient Instructions (Addendum)
Medication Instructions:  Your physician recommends that you continue on your current medications as directed. Please refer to the Current Medication list given to you today.  If you need a refill on your cardiac medications before your next appointment, please call your pharmacy.   Lab work: None   If you have labs (blood work) drawn today and your tests are completely normal, you will receive your results only by: . MyChart Message (if you have MyChart) OR . A paper copy in the mail If you have any lab test that is abnormal or we need to change your treatment, we will call you to review the results.  Testing/Procedures: None   Follow-Up: At [facility], you and your health needs are our priority.  As part of our continuing mission to provide you with exceptional heart care, we have created designated Provider Care Teams.  These Care Teams include your primary Cardiologist (physician) and Advanced Practice Providers (APPs -  Physician Assistants and Nurse Practitioners) who all work together to provide you with the care you need, when you need it. You will need a follow up appointment in:  12 months.  Please call our office 2 months in advance to schedule this appointment.  You may see Michael Cooper, MD or one of the following Advanced Practice Providers on your designated Care Team: Scott Weaver, PA-C Vin Bhagat, PA-C . Izaiyah Kleinman, NP  Any Other Special Instructions Will Be Listed Below (If Applicable).   Lifestyle Modifications to Prevent and Treat Heart Disease -Recommend heart healthy/Mediterranean diet, with whole grains, fruits, vegetables, fish, lean meats, nuts, olive oil and avocado oil.  -Limit salt intake to less than 1500 mg per day.  -Recommend moderate walking, starting slowly with a few minutes and working up to 3-5 times/week for 30-50 minutes each session. Aim for at least 150 minutes.week. Goal should be pace of 3 miles/hours, or walking 1.5 miles in 30  minutes -Recommend avoidance of tobacco products. Avoid excess alcohol. -Keep blood pressure well controlled, ideally less than 130/80.   

## 2018-10-05 NOTE — Progress Notes (Signed)
Cardiology Office Note:    Date:  10/05/2018   ID:  Mike Ray, DOB December 22, 1950, MRN [id number]  PCP:  Binnie Rail, MD  Cardiologist:  Sherren Mocha, MD  Referring MD: Binnie Rail, MD   Chief Complaint  Patient presents with  . Follow-up  . Coronary Artery Disease  . Hypertension  . Hyperlipidemia    History of Present Illness:    Mike Ray is a 68 y.o. male with a past medical history significant for CAD s/p inferolateral STEMI with PCI to RCA 2017, hypertension, hyperlipidemia.  The patient was last seen in the office on 10/05/2017 by Dr. Burt Knack.  He was noted to have resting chest pain that has occurred ever since his heart attack.  He has fleeting sharp pains that last a few seconds and caused him to catch his breath.  He also did have an episode of chest pressure.  A Lexiscan Myoview done on 10/11/2017 showed large prior infarct but no ischemia.  EF was estimated at 38% however a follow-up echocardiogram on 10/13/2017 showed normal EF of 55-60% with no regional wall motion abnormalities, grade 1 diastolic dysfunction.  Dr. Burt Knack recommended increasing amlodipine to 10 mg, however the patient says that he never did increase it.  Amlodipine increased to 10 mg per PCP on 07/24/2018.  Mike Ray is here today for annual follow up. He is a Development worker, international aid and very active. He swims twice a week. He is still having the brief, sharp chest pains although less often. Lasts a second or 2. Has occurred about once in the last 2 months. He has no exertional chest pain/pressure or shortness of breath. No orthopnea, PND, edema, palpitations, lightheadedness or syncope.   He drinks 4-8 beers about twice a week. He has joint pains, knees and has had both hips replaced. He still tries to be very active despite pain.   Patient's mother was a Radiographer, therapeutic and he used to eat a lot of homemade pies.  He says that his triglycerides were significantly elevated in the past but have been much  improved since he has cut out the pies.  He also says that he had multiple kidney stones when asked sugar intake was high (>100 grams per day) but he has had none since he lowered his sugar intake to less than 50 g/day.  Past Medical History:  Diagnosis Date  . Acute MI, inferolateral wall, initial episode of care ([place]) 07/30/2015  . Coronary artery disease   . Elevated PSA    Dr Barnie Del  . Gilbert's syndrome   . History of heat stroke 2009  . History of nephrolithiasis   . Hyperlipidemia    NMR 06/2009: LDL [id number]), HDL 40, TG 127. Framingham Study LDL goal =< 130   . Hypertension   . Osteoarthritis of left hip     Past Surgical History:  Procedure Laterality Date  . CARDIAC CATHETERIZATION    . CARDIAC CATHETERIZATION N/A 07/30/2015   Procedure: Left Heart Cath and Coronary Angiography;  Surgeon: Sherren Mocha, MD;  Location: [facility];  Service: Cardiovascular;  Laterality: N/A;  . CARDIAC CATHETERIZATION N/A 07/30/2015   Procedure: Coronary Stent Intervention;  Surgeon: Sherren Mocha, MD;  Location: [facility];  Service: Cardiovascular;  Laterality: N/A;  Distal RCA- Promus 3.50x16  . COLONOSCOPY  2010   negative  . INGUINAL HERNIA REPAIR Right 10/19/2017   Procedure: RIGHT  INGUINAL HERNIA REPAIR ERAS PATHWAY;  Surgeon: Erroll Luna, MD;  Location:  [facility];  Service: General;  Laterality: Right;  Tap block  . INSERTION OF MESH Right 10/19/2017   Procedure: INSERTION OF MESH;  Surgeon: Erroll Luna, MD;  Location: [facility];  Service: General;  Laterality: Right;  . LITHOTRIPSY  2009   Dr.Duckett, [place]   . TOTAL HIP ARTHROPLASTY  2008   left  . TOTAL HIP ARTHROPLASTY  2004   right    Current Medications: Current Meds  Medication Sig  . amLODipine (NORVASC) 10 MG tablet Take 1 tablet (10 mg total) by mouth daily.  Marland Kitchen aspirin 81 MG tablet Take 81 mg by mouth daily.  Marland Kitchen atorvastatin (LIPITOR) 80 MG tablet TAKE 1  TABLET(80 MG) BY MOUTH DAILY AT 6 PM  . fish oil-omega-3 fatty acids 1000 MG capsule Take 1 g by mouth every other day.   . losartan (COZAAR) 100 MG tablet Take 1 tablet (100 mg total) by mouth daily.  . metoprolol tartrate (LOPRESSOR) 25 MG tablet Take 1/2 (one-half) tablet by mouth twice daily  . Multiple Vitamin (MULTIVITAMIN) tablet Take 1 tablet by mouth every other day.   . mupirocin ointment (BACTROBAN) 2 % Place 1 application into the nose 2 (two) times daily.  . nitroGLYCERIN (NITROSTAT) 0.4 MG SL tablet Place 1 tablet (0.4 mg total) under the tongue every 5 (five) minutes x 3 doses as needed for chest pain.     Allergies:   Naproxen sodium   Social History   Socioeconomic History  . Marital status: Married    Spouse name: Not on file  . Number of children: 3  . Years of education: Not on file  . Highest education level: Not on file  Occupational History  . Occupation: Museum/gallery curator  . Financial resource strain: Not hard at all  . Food insecurity    Worry: Never true    Inability: Never true  . Transportation needs    Medical: No    Non-medical: No  Tobacco Use  . Smoking status: Former Smoker    Quit date: 01/26/2000    Years since quitting: 18.7  . Smokeless tobacco: Never Used  . Tobacco comment: Age 34  Substance and Sexual Activity  . Alcohol use: Yes    Comment: 15 beers/week  . Drug use: No  . Sexual activity: Yes    Birth control/protection: Other-see comments  Lifestyle  . Physical activity    Days per week: 0 days    Minutes per session: 0 min  . Stress: To some extent  Relationships  . Social connections    Talks on phone: More than three times a week    Gets together: More than three times a week    Attends religious service: More than 4 times per year    Active member of club or organization: Yes    Attends meetings of clubs or organizations: More than 4 times per year    Relationship status: Married  Other Topics Concern  . Not on  file  Social History Narrative  . Not on file     Family History: The patient's family history includes Coronary artery disease in his brother; Diabetes in his mother; Heart attack (age of onset: 20) in his father; Heart attack (age of onset: 62) in his brother; Hypertension in his brother and mother; Prostate cancer (age of onset: 49) in his brother. There is no history of Stroke. ROS:   Please see the history of present illness.  All other systems reviewed and are negative.  EKGs/Labs/Other Studies Reviewed:    The following studies were reviewed today:  Echocardiogram 10/13/2016 Study Conclusions  - Left ventricle: The cavity size was mildly dilated. Systolic   function was normal. The estimated ejection fraction was in the   range of 55% to 60%. Wall motion was normal; there were no   regional wall motion abnormalities. Doppler parameters are   consistent with abnormal left ventricular relaxation (grade 1   diastolic dysfunction). There was no evidence of elevated   ventricular filling pressure by Doppler parameters. - Ventricular septum: Septal motion showed paradox. - Aortic valve: There was no regurgitation. - Aortic root: The aortic root was normal in size. - Mitral valve: There was no regurgitation. - Left atrium: The atrium was moderately dilated. - Right ventricle: The cavity size was normal. Wall thickness was   normal. Systolic function was normal. - Right atrium: The atrium was normal in size. - Tricuspid valve: There was no regurgitation. - Pulmonic valve: There was no regurgitation. - Pulmonary arteries: Systolic pressure could not be accurately   estimated. - Inferior vena cava: The vessel was normal in size. - Pericardium, extracardiac: There was no pericardial effusion.  Carl[facility] Adam Myoview 10/11/2017 Study Highlights   Nuclear stress EF: 38%.  There was no ST segment deviation noted during stress.  Defect 1: There is a large defect of severe  severity present in the basal inferior, basal inferolateral, mid inferior and mid inferolateral location.  Defect 2: There is a small defect of moderate severity present in the mid anteroseptal location.  This is an intermediate risk study.  Findings consistent with prior myocardial infarction.  The left ventricular ejection fraction is moderately decreased (30-44%).   Abnormal, intermediate risk stress nuclear study with large prior inferior infarct and small prior anteroseptal infarct.  No ischemia.  Gated ejection fraction 38% with akinesis of the inferior wall.  Moderate left ventricular enlargement.  Study interpreted as intermediate risk due to reduced LV function.     EKG:  EKG is ordered today.  The ekg ordered today demonstrates sinus rhythm with PVC, old inferior infarct, 67 bpm, QTC 397.  No significant changes from previous  Recent Labs: 07/24/2018: ALT 26; BUN 15; Creatinine, Ser 0.89; Hemoglobin 15.5; Platelets 226.0; Potassium 4.8; Sodium 138; TSH 1.43   Recent Lipid Panel    Component Value Date/Time   CHOL 97 07/24/2018 0950   TRIG 126.0 07/24/2018 0950   HDL 38.60 (L) 07/24/2018 0950   CHOLHDL 3 07/24/2018 0950   VLDL 25.2 07/24/2018 0950   LDLCALC 33 07/24/2018 0950   LDLDIRECT 117.0 07/10/2014 0916    Physical Exam:    VS:  BP 128/70   Pulse 67   Ht 5\' 11"  (1.803 m)   Wt 224 lb 1.9 oz (101.7 kg)   SpO2 96%   BMI 31.26 kg/m     Wt Readings from Last 3 Encounters:  10/05/18 224 lb 1.9 oz (101.7 kg)  07/24/18 221 lb 6.4 oz (100.4 kg)  01/11/18 232 lb (105.2 kg)     Physical Exam  Constitutional: He is oriented to person, place, and time. He appears well-developed and well-nourished. No distress.  HENT:  Head: Normocephalic and atraumatic.  Neck: Normal range of motion. Neck supple. No JVD present.  Cardiovascular: Normal rate, regular rhythm, normal heart sounds and intact distal pulses. Exam reveals no gallop and no friction rub.  No murmur  heard. Pulmonary/Chest: Effort normal and breath sounds normal.  No respiratory distress. He has no wheezes. He has no rales.  Abdominal: Soft. Bowel sounds are normal.  Musculoskeletal: Normal range of motion.        General: No edema.     Comments: Arthritic changes in the hands  Neurological: He is alert and oriented to person, place, and time.  Skin: Skin is warm and dry.  Psychiatric: He has a normal mood and affect. His behavior is normal. Judgment and thought content normal.  Vitals reviewed.  ASSESSMENT:    1. Coronary artery disease involving native coronary artery of native heart without angina pectoris   2. Essential (primary) hypertension   3. Hyperlipidemia, unspecified hyperlipidemia type    PLAN:    In order of problems listed above:  CAD S/P DES to RCA 2017 -Lexiscan Myoview on 10/11/2017 showed old infarct but no ischemia.  Echocardiogram on 10/13/2017 showed normal LV function.  Patient still having atypical, shocklike, sharp chest pains but much less often than last year.  These are nonexertional. -Medical therapy includes aspirin, statin, beta-blocker and ARB. -Patient is active with no exertional symptoms. -Continue current therapy. -Pt advised to try and reduce his alcohol intake.   Hypertension -On amlodipine 10 mg, losartan 100 mg, Lopressor 12.5 mg -Recent labs 07/24/2018 showed normal renal function and potassium. -BP currently well controlled.   Hyperlipidemia, goal <70 -On atorvastatin 80 mg daily -Lipid panel 07/24/2018: TC 97, HDL 38.6, LDL 33, triglycerides 126.  Well controlled. -Continue current therapy   Medication Adjustments/Labs and Tests Ordered: Current medicines are reviewed at length with the patient today.  Concerns regarding medicines are outlined above. Labs and tests ordered and medication changes are outlined in the patient instructions below:  Patient Instructions  Medication Instructions:  Your physician recommends that you  continue on your current medications as directed. Please refer to the Current Medication list given to you today.  If you need a refill on your cardiac medications before your next appointment, please call your pharmacy.   Lab work: None   If you have labs (blood work) drawn today and your tests are completely normal, you will receive your results only by: Marland Kitchen MyChart Message (if you have MyChart) OR . A paper copy in the mail If you have any lab test that is abnormal or we need to change your treatment, we will call you to review the results.  Testing/Procedures: None   Follow-Up: At [facility], you and your health needs are our priority.  As part of our continuing mission to provide you with exceptional heart care, we have created designated Provider Care Teams.  These Care Teams include your primary Cardiologist (physician) and Advanced Practice Providers (APPs -  Physician Assistants and Nurse Practitioners) who all work together to provide you with the care you need, when you need it. You will need a follow up appointment in:  12 months.  Please call our office 2 months in advance to schedule this appointment.  You may see Sherren Mocha, MDor one of the following Advanced Practice Providers on your designated Care Team: Richardson Dopp, PA-C [place], [place] . Daune Perch, NP  Any Other Special Instructions Will Be Listed Below (If Applicable).   Lifestyle Modifications to Prevent and Treat Heart Disease -Recommend heart healthy/Mediterranean diet, with whole grains, fruits, vegetables, fish, lean meats, nuts, olive oil and avocado oil.  -Limit salt intake to less than 1500 mg per day.  -Recommend moderate walking, starting slowly with a few minutes and working up to  3-5 times/week for 30-50 minutes each session. Aim for at least 150 minutes.week. Goal should be pace of 3 miles/hours, or walking 1.5 miles in 30 minutes -Recommend avoidance of tobacco products. Avoid excess  alcohol. -Keep blood pressure well controlled, ideally less than 130/80.      Signed, Daune Perch, NP  10/05/2018 8:34 AM    [place]

## 2018-11-14 ENCOUNTER — Other Ambulatory Visit: Payer: Self-pay | Admitting: Nurse Practitioner

## 2018-11-16 DIAGNOSIS — Z23 Encounter for immunization: Secondary | ICD-10-CM | POA: Diagnosis not present

## 2018-11-30 ENCOUNTER — Other Ambulatory Visit: Payer: Self-pay | Admitting: Internal Medicine

## 2018-12-25 ENCOUNTER — Encounter: Payer: Self-pay | Admitting: Cardiovascular Disease

## 2018-12-25 ENCOUNTER — Other Ambulatory Visit: Payer: Self-pay

## 2018-12-25 ENCOUNTER — Telehealth: Payer: Self-pay | Admitting: Cardiovascular Disease

## 2018-12-25 ENCOUNTER — Ambulatory Visit (INDEPENDENT_AMBULATORY_CARE_PROVIDER_SITE_OTHER): Payer: Medicare Other | Admitting: Cardiovascular Disease

## 2018-12-25 VITALS — BP 142/86 | HR 74 | Ht 71.0 in | Wt 229.6 lb

## 2018-12-25 DIAGNOSIS — I25119 Atherosclerotic heart disease of native coronary artery with unspecified angina pectoris: Secondary | ICD-10-CM | POA: Diagnosis not present

## 2018-12-25 DIAGNOSIS — E782 Mixed hyperlipidemia: Secondary | ICD-10-CM | POA: Diagnosis not present

## 2018-12-25 DIAGNOSIS — I10 Essential (primary) hypertension: Secondary | ICD-10-CM | POA: Diagnosis not present

## 2018-12-25 MED ORDER — NITROGLYCERIN 0.4 MG SL SUBL: 0.4000 mg | SUBLINGUAL_TABLET | SUBLINGUAL | 5 refills | Status: DC | PRN

## 2018-12-25 NOTE — Telephone Encounter (Signed)
New messages.       Patient calling statingh that he is having some trouble with his chest for the last 24 hours. Patient states that he do not know how to explain it and would like to come in for a appt.

## 2018-12-25 NOTE — Patient Instructions (Signed)

## 2018-12-25 NOTE — Progress Notes (Signed)
Cardiology Office Note:    Date:  12/25/2018   ID:  POLLUX EYE, DOB 1950-10-11, MRN [id number]  PCP:  Mike Rail, MD  Cardiologist:  Mike Mocha, MD  Electrophysiologist:  None   Referring MD: Mike Rail, MD   Chief Complaint  Patient presents with  . Coronary Artery Disease    History of Present Illness:    Mike Ray is a 68 y.o. Ray with a hx of coronary artery disease after initially presenting with an inferolateral MI in 2017, treated with primary PCI of the right coronary artery.  Comorbid medical conditions include hypertension and mixed hyperlipidemia. BP last night was 124/82 at home. Normally runs in a good range. He continues to drink alcohol fairly heavily about 2 days per week (6 drinks).   Today, he denies symptoms of palpitations, shortness of breath, orthopnea, PND, lower extremity edema, dizziness, or syncope. He continues to have periodic chest pain at rest, fleeting in nature, unrelated to exertion. He underwent a stress Myoview last year for the same symptoms and this was negative for ischemia.   Past Medical History:  Diagnosis Date  . Acute MI, inferolateral wall, initial episode of care ([facility]) 07/30/2015  . Coronary artery disease   . Elevated PSA    [facility]  . Gilbert's syndrome   . History of heat stroke 2009  . History of nephrolithiasis   . Hyperlipidemia    NMR 06/2009: LDL [id number]), HDL 40, TG 127. Framingham Study LDL goal =< 130   . Hypertension   . Osteoarthritis of left hip     Past Surgical History:  Procedure Laterality Date  . CARDIAC CATHETERIZATION    . CARDIAC CATHETERIZATION N/A 07/30/2015   Procedure: Left Heart Cath and Coronary Angiography;  Surgeon: Mike Mocha, MD;  Location: [facility];  Service: Cardiovascular;  Laterality: N/A;  . CARDIAC CATHETERIZATION N/A 07/30/2015   Procedure: Coronary Stent Intervention;  Surgeon: Mike Mocha, MD;  Location: [facility];  Service:  Cardiovascular;  Laterality: N/A;  Distal RCA- Promus 3.50x16  . COLONOSCOPY  2010   negative  . INGUINAL HERNIA REPAIR Right 10/19/2017   Procedure: RIGHT  INGUINAL HERNIA REPAIR ERAS PATHWAY;  Surgeon: Mike Luna, MD;  Location: [facility];  Service: General;  Laterality: Right;  Tap block  . INSERTION OF MESH Right 10/19/2017   Procedure: INSERTION OF MESH;  Surgeon: Mike Luna, MD;  Location: [facility];  Service: General;  Laterality: Right;  . LITHOTRIPSY  2009   Mike [facility], Mike Ray   . TOTAL HIP ARTHROPLASTY  2008   left  . TOTAL HIP ARTHROPLASTY  2004   right    Current Medications: Current Meds  Medication Sig  . amLODipine (NORVASC) 10 MG tablet Take 1 tablet (10 mg total) by mouth daily.  Marland Kitchen aspirin 81 MG tablet Take 81 mg by mouth daily.  Marland Kitchen atorvastatin (LIPITOR) 80 MG tablet TAKE 1 TABLET(80 MG) BY MOUTH DAILY AT 6 PM  . fish oil-omega-3 fatty acids 1000 MG capsule Take 1 g by mouth every other day.   . losartan (COZAAR) 100 MG tablet Take 1 tablet by mouth once daily  . metoprolol tartrate (LOPRESSOR) 25 MG tablet Take 1/2 (one-half) tablet by mouth twice daily  . Multiple Vitamin (MULTIVITAMIN) tablet Take 1 tablet by mouth every other day.   . mupirocin ointment (BACTROBAN) 2 % Place 1 application into the nose 2 (two) times daily.  . nitroGLYCERIN (  NITROSTAT) 0.4 MG SL tablet Place 1 tablet (0.4 mg total) under the tongue every 5 (five) minutes x 3 doses as needed for chest pain.  . [DISCONTINUED] nitroGLYCERIN (NITROSTAT) 0.4 MG SL tablet Place 1 tablet (0.4 mg total) under the tongue every 5 (five) minutes x 3 doses as needed for chest pain.     Allergies:   Naproxen sodium   Social History   Socioeconomic History  . Marital status: Married    Spouse name: Not on file  . Number of children: 3  . Years of education: Not on file  . Highest education level: Not on file  Occupational History  . Occupation: Therapist, music  . Financial resource strain: Not hard at all  . Food insecurity    Worry: Never true    Inability: Never true  . Transportation needs    Medical: No    Non-medical: No  Tobacco Use  . Smoking status: Former Smoker    Quit date: 01/26/2000    Years since quitting: 18.9  . Smokeless tobacco: Never Used  . Tobacco comment: Age 31  Substance and Sexual Activity  . Alcohol use: Yes    Comment: 15 beers/week  . Drug use: No  . Sexual activity: Yes    Birth control/protection: Other-see comments  Lifestyle  . Physical activity    Days per week: 0 days    Minutes per session: 0 min  . Stress: To some extent  Relationships  . Social connections    Talks on phone: More than three times a week    Gets together: More than three times a week    Attends religious service: More than 4 times per year    Active member of club or organization: Yes    Attends meetings of clubs or organizations: More than 4 times per year    Relationship status: Married  Other Topics Concern  . Not on file  Social History Narrative  . Not on file     Family History: The patient's family history includes Coronary artery disease in his brother; Diabetes in his mother; Heart attack (age of onset: 64) in his father; Heart attack (age of onset: 61) in his brother; Hypertension in his brother and mother; Prostate cancer (age of onset: 64) in his brother. There is no history of Stroke.  ROS:   Please see the history of present illness.     All other systems reviewed and are negative.  EKGs/Labs/Other Studies Reviewed:    The following studies were reviewed today: Nuclear Perfusion Scan 10-11-2017: Study Highlights    Nuclear stress EF: 38%.  There was no ST segment deviation noted during stress.  Defect 1: There is a large defect of severe severity present in the basal inferior, basal inferolateral, mid inferior and mid inferolateral location.  Defect 2: There is a small defect of  moderate severity present in the mid anteroseptal location.  This is an intermediate risk study.  Findings consistent with prior myocardial infarction.  The left ventricular ejection fraction is moderately decreased (30-44%).   Abnormal, intermediate risk stress nuclear study with large prior inferior infarct and small prior anteroseptal infarct.  No ischemia.  Gated ejection fraction 38% with akinesis of the inferior wall.  Moderate left ventricular enlargement.  Study interpreted as intermediate risk due to reduced LV function.   Echo 10-13-2017: Study Conclusions  - Left ventricle: The cavity size was mildly dilated. Systolic   function was normal. The estimated ejection  fraction was in the   range of 55% to 60%. Wall motion was normal; there were no   regional wall motion abnormalities. Doppler parameters are   consistent with abnormal left ventricular relaxation (grade 1   diastolic dysfunction). There was no evidence of elevated   ventricular filling pressure by Doppler parameters. - Ventricular septum: Septal motion showed paradox. - Aortic valve: There was no regurgitation. - Aortic root: The aortic root was normal in size. - Mitral valve: There was no regurgitation. - Left atrium: The atrium was moderately dilated. - Right ventricle: The cavity size was normal. Wall thickness was   normal. Systolic function was normal. - Right atrium: The atrium was normal in size. - Tricuspid valve: There was no regurgitation. - Pulmonic valve: There was no regurgitation. - Pulmonary arteries: Systolic pressure could not be accurately   estimated. - Inferior vena cava: The vessel was normal in size. - Pericardium, extracardiac: There was no pericardial effusion.  EKG:  EKG is ordered today.  The ekg ordered today demonstrates NSR 74 bpm, PVC's, moderate voltage criteria for LVH may be normal variant, age-indeterminate inferior infarct. No change from old tracing.  Recent Labs:  07/24/2018: ALT 26; BUN 15; Creatinine, Ser 0.89; Hemoglobin 15.5; Platelets 226.0; Potassium 4.8; Sodium 138; TSH 1.43  Recent Lipid Panel    Component Value Date/Time   CHOL 97 07/24/2018 0950   TRIG 126.0 07/24/2018 0950   HDL 38.60 (L) 07/24/2018 0950   CHOLHDL 3 07/24/2018 0950   VLDL 25.2 07/24/2018 0950   LDLCALC 33 07/24/2018 0950   LDLDIRECT 117.0 07/10/2014 0916    Physical Exam:    VS:  BP (!) 142/86   Pulse 74   Ht 5\' 11"  (1.803 m)   Wt 229 lb 9.6 oz (104.1 kg)   SpO2 94%   BMI 32.02 kg/m     Wt Readings from Last 3 Encounters:  12/25/18 229 lb 9.6 oz (104.1 kg)  10/05/18 224 lb 1.9 oz (101.7 kg)  07/24/18 221 lb 6.4 oz (100.4 kg)     GEN:  Well nourished, well developed in no acute distress HEENT: Normal NECK: No JVD; No carotid bruits LYMPHATICS: No lymphadenopathy CARDIAC: RRR, no murmurs, rubs, gallops RESPIRATORY:  Clear to auscultation without rales, wheezing or rhonchi  ABDOMEN: Soft, non-tender, non-distended MUSCULOSKELETAL:  No edema; No deformity  SKIN: Warm and dry NEUROLOGIC:  Alert and oriented x 3 PSYCHIATRIC:  Normal affect   ASSESSMENT:    1. Coronary artery disease involving native coronary artery of native heart with angina pectoris ([place])   2. Mixed hyperlipidemia   3. Essential hypertension    PLAN:    In order of problems listed above:  1. He continues to have atypical angina.  His symptoms have not changed since his nuclear scan last year and I do not think further testing is indicated at this time.  He will continue on his current medical program of aspirin, metoprolol, and atorvastatin. 2. Lipids have been excellent with most recent LDL cholesterol 33 mg/dL.  He is treated with a Mike intensity statin drug on atorvastatin 80 mg daily. 3. Blood pressure has been well controlled based on home readings.  He continues on amlodipine, losartan, and metoprolol.  Medication Adjustments/Labs and Tests Ordered: Current medicines are  reviewed at length with the patient today.  Concerns regarding medicines are outlined above.  Orders Placed This Encounter  Procedures  . EKG 12-Lead   Meds ordered this encounter  Medications  . nitroGLYCERIN (  NITROSTAT) 0.4 MG SL tablet    Sig: Place 1 tablet (0.4 mg total) under the tongue every 5 (five) minutes x 3 doses as needed for chest pain.    Dispense:  25 tablet    Refill:  5    Patient Instructions  Medication Instructions:  Your provider recommends that you continue on your current medications as directed. Please refer to the Current Medication list given to you today.   *If you need a refill on your cardiac medications before your next appointment, please call your pharmacy*  Follow-Up: At [facility], you and your health needs are our priority.  As part of our continuing mission to provide you with exceptional heart care, we have created designated Provider Care Teams.  These Care Teams include your primary Cardiologist (physician) and Advanced Practice Providers (APPs -  Physician Assistants and Nurse Practitioners) who all work together to provide you with the care you need, when you need it. Your next appointment:   12 month(s) The format for your next appointment:   In Person Provider:   You may see Mike Mocha, MD or one of the following Advanced Practice Providers on your designated Care Team:    Richardson Dopp, PA-C  Vin Summerfield, PA-C  Daune Perch, [place]    Signed, Mike Mocha, MD  12/25/2018 3:24 PM    [facility]

## 2018-12-25 NOTE — Telephone Encounter (Signed)
I spoke to the patient who is calling with intermittent CP over the past 36 hours.  His vitals are stable and he is not SOB.  He is presently pain free so I scheduled him with Dr Burt Knack this afternoon at 2:40.  I told him to go to the ED if pain persists or worsens.  He verbalized understanding.

## 2019-01-24 NOTE — Progress Notes (Signed)
Subjective:    Patient ID: Mike [facility], male    DOB: 09-05-1950, 68 y.o.   MRN: [id number]  HPI The patient is here for follow up.  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  He swims for exercise once every 5 days.    CAD, Hypertension: He is taking his medication daily. He is compliant with a low sodium diet.  He was having jolts of chest pain that lasted 1-2 secs, but did see cardio and it was not heart related.  It has gone away.  He denies chest pain, palpitations, edema, shortness of breath and regular headaches.    Hyperlipidemia: He is taking his medication daily. He is compliant with a low fat/cholesterol diet. He denies myalgias.   Prediabetes:  He is compliant with a low sugar/carbohydrate diet.  He is exercising regularly.   Medications and allergies reviewed with patient and updated if appropriate.  Patient Active Problem List   Diagnosis Date Noted  . Skin cancer 07/24/2018  . Non-recurrent unilateral inguinal hernia without obstruction or gangrene 09/12/2017  . Prediabetes 07/04/2016  . CAD (coronary artery disease) 01/04/2016  . History of ST elevation myocardial infarction (STEMI) 01/04/2016  . NEPHROLITHIASIS, HX OF 10/22/2008  . CHOLELITHIASIS 11/08/2007  . Essential hypertension 10/17/2007  . HYPERLIPIDEMIA 12/13/2006  . ELEVATED PROSTATE SPECIFIC ANTIGEN 09/16/2006    Current Outpatient Medications on File Prior to Visit  Medication Sig Dispense Refill  . amLODipine (NORVASC) 10 MG tablet Take 1 tablet (10 mg total) by mouth daily. 90 tablet 3  . aspirin 81 MG tablet Take 81 mg by mouth daily.    Marland Kitchen atorvastatin (LIPITOR) 80 MG tablet TAKE 1 TABLET(80 MG) BY MOUTH DAILY AT 6 PM 90 tablet 1  . fish oil-omega-3 fatty acids 1000 MG capsule Take 1 g by mouth every other day.     . losartan (COZAAR) 100 MG tablet Take 1 tablet by mouth once daily 90 tablet 3  . metoprolol tartrate (LOPRESSOR) 25 MG tablet Take 1/2 (one-half) tablet by mouth twice  daily 90 tablet 1  . Multiple Vitamin (MULTIVITAMIN) tablet Take 1 tablet by mouth every other day.     . mupirocin ointment (BACTROBAN) 2 % Place 1 application into the nose 2 (two) times daily. 22 g 0  . nitroGLYCERIN (NITROSTAT) 0.4 MG SL tablet Place 1 tablet (0.4 mg total) under the tongue every 5 (five) minutes x 3 doses as needed for chest pain. 25 tablet 5   No current facility-administered medications on file prior to visit.    Past Medical History:  Diagnosis Date  . Acute MI, inferolateral wall, initial episode of care ([place]) 07/30/2015  . Coronary artery disease   . Elevated PSA    Dr Barnie Del  . Gilbert's syndrome   . History of heat stroke 2009  . History of nephrolithiasis   . Hyperlipidemia    NMR 06/2009: LDL [id number]), HDL 40, TG 127. Framingham Study LDL goal =< 130   . Hypertension   . Osteoarthritis of left hip     Past Surgical History:  Procedure Laterality Date  . CARDIAC CATHETERIZATION    . CARDIAC CATHETERIZATION N/A 07/30/2015   Procedure: Left Heart Cath and Coronary Angiography;  Surgeon: Sherren Mocha, MD;  Location: [facility];  Service: Cardiovascular;  Laterality: N/A;  . CARDIAC CATHETERIZATION N/A 07/30/2015   Procedure: Coronary Stent Intervention;  Surgeon: Sherren Mocha, MD;  Location: [facility];  Service: Cardiovascular;  Laterality: N/A;  Distal RCA- Promus 3.50x16  . COLONOSCOPY  2010   negative  . INGUINAL HERNIA REPAIR Right 10/19/2017   Procedure: RIGHT  INGUINAL HERNIA REPAIR ERAS PATHWAY;  Surgeon: Erroll Luna, MD;  Location: [facility];  Service: General;  Laterality: Right;  Tap block  . INSERTION OF MESH Right 10/19/2017   Procedure: INSERTION OF MESH;  Surgeon: Erroll Luna, MD;  Location: [place] [facility];  Service: General;  Laterality: Right;  . LITHOTRIPSY  2009   Dr.Duckett, [place]   . TOTAL HIP ARTHROPLASTY  2008   left  . TOTAL HIP ARTHROPLASTY  2004   right    Social  History   Socioeconomic History  . Marital status: Married    Spouse name: Not on file  . Number of children: 3  . Years of education: Not on file  . Highest education level: Not on file  Occupational History  . Occupation: Chief Strategy Officer  Tobacco Use  . Smoking status: Former Smoker    Quit date: 01/26/2000    Years since quitting: 19.0  . Smokeless tobacco: Never Used  . Tobacco comment: Age 64  Substance and Sexual Activity  . Alcohol use: Yes    Comment: 15 beers/week  . Drug use: No  . Sexual activity: Yes    Birth control/protection: Other-see comments  Other Topics Concern  . Not on file  Social History Narrative  . Not on file   Social Determinants of Health   Financial Resource Strain:   . Difficulty of Paying Living Expenses: Not on file  Food Insecurity:   . Worried About Charity fundraiser in the Last Year: Not on file  . Ran Out of Food in the Last Year: Not on file  Transportation Needs:   . Lack of Transportation (Medical): Not on file  . Lack of Transportation (Non-Medical): Not on file  Physical Activity:   . Days of Exercise per Week: Not on file  . Minutes of Exercise per Session: Not on file  Stress:   . Feeling of Stress : Not on file  Social Connections:   . Frequency of Communication with Friends and Family: Not on file  . Frequency of Social Gatherings with Friends and Family: Not on file  . Attends Religious Services: Not on file  . Active Member of Clubs or Organizations: Not on file  . Attends Archivist Meetings: Not on file  . Marital Status: Not on file    Family History  Problem Relation Age of Onset  . Diabetes Mother   . Hypertension Mother   . Heart attack Father 98       smoker  . Prostate cancer Brother 44       CAD; stents @ 75  . Hypertension Brother   . Heart attack Brother 59  . Coronary artery disease Brother        stents late 74s  . Stroke Neg Hx     Review of Systems  Constitutional: Negative for  chills and fever.  Respiratory: Negative for cough, shortness of breath and wheezing.   Cardiovascular: Negative for chest pain, palpitations and leg swelling.  Neurological: Negative for dizziness, light-headedness and headaches.       Objective:   Vitals:   01/29/19 0824  BP: (!) 142/90  Pulse: 77  Resp: 16  Temp: 97.9 F (36.6 C)  SpO2: 96%   BP Readings from Last 3 Encounters:  01/29/19 (!) 142/90  12/25/18 (!) 142/86  10/05/18 128/70   Wt Readings from Last 3 Encounters:  01/29/19 230 lb 9.6 oz (104.6 kg)  12/25/18 229 lb 9.6 oz (104.1 kg)  10/05/18 224 lb 1.9 oz (101.7 kg)   Body mass index is 32.16 kg/m.   Physical Exam    Constitutional: Appears well-developed and well-nourished. No distress.  HENT:  Head: Normocephalic and atraumatic.  Neck: Neck supple. No tracheal deviation present. No thyromegaly present.  No cervical lymphadenopathy Cardiovascular: Normal rate, regular rhythm and normal heart sounds.   No murmur heard. No carotid bruit .  No edema Pulmonary/Chest: Effort normal and breath sounds normal. No respiratory distress. No has no wheezes. No rales.  Skin: Skin is warm and dry. Not diaphoretic.  Psychiatric: Normal mood and affect. Behavior is normal.      Assessment & Plan:    See Problem List for Assessment and Plan of chronic medical problems.     This visit occurred during the SARS-CoV-2 public health emergency.  Safety protocols were in place, including screening questions prior to the visit, additional usage of staff PPE, and extensive cleaning of exam room while observing appropriate contact time as indicated for disinfecting solutions.

## 2019-01-24 NOTE — Patient Instructions (Addendum)
  Blood work was ordered.   ° ° °Medications reviewed and updated.  Changes include :   none ° ° ° °Please followup in 6 months ° ° °

## 2019-01-29 ENCOUNTER — Other Ambulatory Visit: Payer: Self-pay

## 2019-01-29 ENCOUNTER — Encounter: Payer: Self-pay | Admitting: Internal Medicine

## 2019-01-29 ENCOUNTER — Ambulatory Visit (INDEPENDENT_AMBULATORY_CARE_PROVIDER_SITE_OTHER): Payer: Medicare Other | Admitting: Internal Medicine

## 2019-01-29 VITALS — BP 142/90 | HR 77 | Temp 97.9°F | Resp 16 | Ht 71.0 in | Wt 230.6 lb

## 2019-01-29 DIAGNOSIS — E782 Mixed hyperlipidemia: Secondary | ICD-10-CM

## 2019-01-29 DIAGNOSIS — R7303 Prediabetes: Secondary | ICD-10-CM

## 2019-01-29 DIAGNOSIS — I2510 Atherosclerotic heart disease of native coronary artery without angina pectoris: Secondary | ICD-10-CM | POA: Diagnosis not present

## 2019-01-29 DIAGNOSIS — I10 Essential (primary) hypertension: Secondary | ICD-10-CM | POA: Diagnosis not present

## 2019-01-29 LAB — COMPREHENSIVE METABOLIC PANEL
ALT: 35 U/L (ref 0–53)
AST: 28 U/L (ref 0–37)
Albumin: 4.8 g/dL (ref 3.5–5.2)
Alkaline Phosphatase: 69 U/L (ref 39–117)
BUN: 15 mg/dL (ref 6–23)
CO2: 26 mEq/L (ref 19–32)
Calcium: 9.9 mg/dL (ref 8.4–10.5)
Chloride: 101 mEq/L (ref 96–112)
Creatinine, Ser: 0.88 mg/dL (ref 0.40–1.50)
GFR: 85.94 mL/min (ref >60.00)
Glucose, Bld: 129 mg/dL — ABNORMAL HIGH (ref 70–99)
Potassium: 4.3 mEq/L (ref 3.5–5.1)
Sodium: 138 mEq/L (ref 135–145)
Total Bilirubin: 1.4 mg/dL — ABNORMAL HIGH (ref 0.2–1.2)
Total Protein: 7.4 g/dL (ref 6.0–8.3)

## 2019-01-29 LAB — LIPID PANEL
Cholesterol: 105 mg/dL (ref 0–200)
HDL: 40.1 mg/dL (ref >39.00)
LDL Cholesterol: 29 mg/dL (ref 0–99)
NonHDL: 65.26
Total CHOL/HDL Ratio: 3
Triglycerides: 181 mg/dL — ABNORMAL HIGH (ref 0.0–149.0)
VLDL: 36.2 mg/dL (ref 0.0–40.0)

## 2019-01-29 LAB — HEMOGLOBIN A1C: Hgb A1c MFr Bld: 5.9 % (ref 4.6–6.5)

## 2019-01-29 NOTE — Assessment & Plan Note (Signed)
Chronic, stable Check lipid panel  Continue daily statin Regular exercise and healthy diet encouraged

## 2019-01-29 NOTE — Assessment & Plan Note (Signed)
Chronic Check a1c Low sugar / carb diet Stressed regular exercise  

## 2019-01-29 NOTE — Assessment & Plan Note (Signed)
No concerning symptoms such as chest pain, shortness of breath, palpitations Controlled Continue aspirin, statin, metoprolol Following with cardiology Lipid, CMP

## 2019-01-29 NOTE — Assessment & Plan Note (Signed)
Chronic BP minimally elevated here today, but appears to be controlled overall Continue current medications at current doses CMP

## 2019-01-30 ENCOUNTER — Encounter: Payer: Self-pay | Admitting: Internal Medicine

## 2019-02-15 ENCOUNTER — Other Ambulatory Visit: Payer: Self-pay | Admitting: Internal Medicine

## 2019-04-16 ENCOUNTER — Encounter: Payer: Self-pay | Admitting: Internal Medicine

## 2019-04-16 DIAGNOSIS — R040 Epistaxis: Secondary | ICD-10-CM

## 2019-04-30 ENCOUNTER — Encounter: Payer: Self-pay | Admitting: Internal Medicine

## 2019-05-22 DIAGNOSIS — L821 Other seborrheic keratosis: Secondary | ICD-10-CM | POA: Diagnosis not present

## 2019-05-22 DIAGNOSIS — L738 Other specified follicular disorders: Secondary | ICD-10-CM | POA: Diagnosis not present

## 2019-05-22 DIAGNOSIS — L570 Actinic keratosis: Secondary | ICD-10-CM | POA: Diagnosis not present

## 2019-05-22 DIAGNOSIS — D1801 Hemangioma of skin and subcutaneous tissue: Secondary | ICD-10-CM | POA: Diagnosis not present

## 2019-05-22 DIAGNOSIS — L578 Other skin changes due to chronic exposure to nonionizing radiation: Secondary | ICD-10-CM | POA: Diagnosis not present

## 2019-06-07 ENCOUNTER — Ambulatory Visit (INDEPENDENT_AMBULATORY_CARE_PROVIDER_SITE_OTHER): Payer: Medicare Other | Admitting: Otolaryngology

## 2019-06-07 ENCOUNTER — Encounter (INDEPENDENT_AMBULATORY_CARE_PROVIDER_SITE_OTHER): Payer: Self-pay | Admitting: Otolaryngology

## 2019-06-07 ENCOUNTER — Other Ambulatory Visit: Payer: Self-pay

## 2019-06-07 VITALS — Temp 97.7°F

## 2019-06-07 DIAGNOSIS — J3489 Other specified disorders of nose and nasal sinuses: Secondary | ICD-10-CM | POA: Diagnosis not present

## 2019-06-07 DIAGNOSIS — I2510 Atherosclerotic heart disease of native coronary artery without angina pectoris: Secondary | ICD-10-CM

## 2019-06-07 NOTE — Progress Notes (Signed)
HPI: Mike Ray is a 69 y.o. male who presents is referred by Dr. Quay Burow for evaluation of nasal complaints.  He complains of chronic intermittent sores that develop in his nostrils along the floor and laterally in the nostrils.  He has had these intermittently for well over a year.  He notices crusting in his nose and frequently picks at his nose.  He has previously been prescribed antibiotic ointment to use in the nose that helps temporarily.  He has also used Vaseline which seems to help. He is actually doing better today in the office without using anything over the past 4 days. He has minimal trouble breathing through his nose although when he sleeps he breathes more comfortably when sleeping on his left side..  Past Medical History:  Diagnosis Date  . Acute MI, inferolateral wall, initial episode of care ([place]) 07/30/2015  . Coronary artery disease   . Elevated PSA    Dr Barnie Del  . Gilbert's syndrome   . History of heat stroke 2009  . History of nephrolithiasis   . Hyperlipidemia    NMR 06/2009: LDL [id number]), HDL 40, TG 127. Framingham Study LDL goal =< 130   . Hypertension   . Osteoarthritis of left hip    Past Surgical History:  Procedure Laterality Date  . CARDIAC CATHETERIZATION    . CARDIAC CATHETERIZATION N/A 07/30/2015   Procedure: Left Heart Cath and Coronary Angiography;  Surgeon: Sherren Mocha, MD;  Location: [facility];  Service: Cardiovascular;  Laterality: N/A;  . CARDIAC CATHETERIZATION N/A 07/30/2015   Procedure: Coronary Stent Intervention;  Surgeon: Sherren Mocha, MD;  Location: [facility];  Service: Cardiovascular;  Laterality: N/A;  Distal RCA- Promus 3.50x16  . COLONOSCOPY  2010   negative  . INGUINAL HERNIA REPAIR Right 10/19/2017   Procedure: RIGHT  INGUINAL HERNIA REPAIR ERAS PATHWAY;  Surgeon: Erroll Luna, MD;  Location: [facility];  Service: General;  Laterality: Right;  Tap block  . INSERTION OF MESH Right 10/19/2017    Procedure: INSERTION OF MESH;  Surgeon: Erroll Luna, MD;  Location: [facility];  Service: General;  Laterality: Right;  . LITHOTRIPSY  2009   Dr.Duckett, [place]   . TOTAL HIP ARTHROPLASTY  2008   left  . TOTAL HIP ARTHROPLASTY  2004   right   Social History   Socioeconomic History  . Marital status: Married    Spouse name: Not on file  . Number of children: 3  . Years of education: Not on file  . Highest education level: Not on file  Occupational History  . Occupation: Chief Strategy Officer  Tobacco Use  . Smoking status: Former Smoker    Packs/day: 1.00    Years: 32.00    Pack years: 32.00    Start date: 1970    Quit date: 01/26/2000    Years since quitting: 19.3  . Smokeless tobacco: Never Used  . Tobacco comment: Pt stopped a few times.  Substance and Sexual Activity  . Alcohol use: Yes    Comment: 15 beers/week  . Drug use: No  . Sexual activity: Yes    Birth control/protection: Other-see comments  Other Topics Concern  . Not on file  Social History Narrative  . Not on file   Social Determinants of Health   Financial Resource Strain:   . Difficulty of Paying Living Expenses:   Food Insecurity:   . Worried About Charity fundraiser in the Last Year:   .  Ran Out of Food in the Last Year:   Transportation Needs:   . Film/video editor (Medical):   Marland Kitchen Lack of Transportation (Non-Medical):   Physical Activity:   . Days of Exercise per Week:   . Minutes of Exercise per Session:   Stress:   . Feeling of Stress :   Social Connections:   . Frequency of Communication with Friends and Family:   . Frequency of Social Gatherings with Friends and Family:   . Attends Religious Services:   . Active Member of Clubs or Organizations:   . Attends Archivist Meetings:   Marland Kitchen Marital Status:    Family History  Problem Relation Age of Onset  . Diabetes Mother   . Hypertension Mother   . Heart attack Father 6       smoker  . Prostate cancer  Brother 1       CAD; stents @ 14  . Hypertension Brother   . Heart attack Brother 81  . Coronary artery disease Brother        stents late 73s  . Stroke Neg Hx    Allergies  Allergen Reactions  . Naproxen Sodium     REACTION: abdominal cramping   Prior to Admission medications   Medication Sig Start Date End Date Taking? Authorizing Provider  amLODipine (NORVASC) 10 MG tablet Take 1 tablet (10 mg total) by mouth daily. 07/24/18 07/19/19 Yes Burns, Claudina Lick, MD  aspirin 81 MG tablet Take 81 mg by mouth daily.   Yes [provider]  atorvastatin (LIPITOR) 80 MG tablet TAKE 1 TABLET(80 MG) BY MOUTH DAILY AT 6 PM 11/30/18  Yes Burns, Claudina Lick, MD  fish oil-omega-3 fatty acids 1000 MG capsule Take 1 g by mouth every other day.    Yes [provider]  losartan (COZAAR) 100 MG tablet Take 1 tablet by mouth once daily 11/14/18  Yes Burtis Junes, NP  metoprolol tartrate (LOPRESSOR) 25 MG tablet Take 1/2 (one-half) tablet by mouth twice daily 02/16/19  Yes Burns, Claudina Lick, MD  Multiple Vitamin (MULTIVITAMIN) tablet Take 1 tablet by mouth every other day.    Yes [provider]  mupirocin ointment (BACTROBAN) 2 % Place 1 application into the nose 2 (two) times daily. 06/13/18  Yes Burns, Claudina Lick, MD  nitroGLYCERIN (NITROSTAT) 0.4 MG SL tablet Place 1 tablet (0.4 mg total) under the tongue every 5 (five) minutes x 3 doses as needed for chest pain. 12/25/18  Yes Sherren Mocha, MD     Positive ROS: Otherwise negative  All other systems have been reviewed and were otherwise negative with the exception of those mentioned in the HPI and as above.  Physical Exam: Constitutional: Alert, well-appearing, no acute distress Ears: External ears without lesions or tenderness. Ear canals are clear bilaterally with intact, clear TMs.  Nasal: External nose without lesions. Septum is deviated to the left with slight concavity on the right side of the anterior cartilaginous septum.Marland Kitchen  He  has minimal dry skin and crusting along the floor the nose inferiorly.  Both middle meatus regions are clear with no evidence of active infection.  Mucus within the nasal cavity is clear. Oral: Lips and gums without lesions. Tongue and palate mucosa without lesions. Posterior oropharynx clear. Neck: No palpable adenopathy or masses Respiratory: Breathing comfortably  Skin: No facial/neck lesions or rash noted.  Procedures  Assessment: Recurrent nasal vestibulitis  Plan: Briefly discussed with [facility] concerning treatment.  Recommended cleaning the area with  soap and water.  Try not picking at the nose. When the nose is sore would recommend using antibiotic ointment and prescribed bacitracin 2% ointment which he is used in the past. If it is not sore and just drying crusting Vaseline should be adequate to use. Presently no evidence of active infection. He will follow-up as needed   Radene Journey, MD   CC:

## 2019-06-10 ENCOUNTER — Other Ambulatory Visit: Payer: Self-pay | Admitting: Internal Medicine

## 2019-06-27 DIAGNOSIS — Z96643 Presence of artificial hip joint, bilateral: Secondary | ICD-10-CM | POA: Diagnosis not present

## 2019-06-27 DIAGNOSIS — M7062 Trochanteric bursitis, left hip: Secondary | ICD-10-CM | POA: Diagnosis not present

## 2019-06-27 DIAGNOSIS — M25561 Pain in right knee: Secondary | ICD-10-CM | POA: Diagnosis not present

## 2019-06-27 DIAGNOSIS — Z471 Aftercare following joint replacement surgery: Secondary | ICD-10-CM | POA: Diagnosis not present

## 2019-06-27 DIAGNOSIS — M25552 Pain in left hip: Secondary | ICD-10-CM | POA: Diagnosis not present

## 2019-08-04 NOTE — Patient Instructions (Addendum)
  Blood work was ordered.   ° ° °Medications reviewed and updated.  Changes include :   none ° ° ° °Please followup in 6 months ° ° °

## 2019-08-04 NOTE — Progress Notes (Signed)
Subjective:    Patient ID: Mike Ray, male    DOB: [birth year], 69 y.o.   MRN: [id number]  HPI The patient is here for follow up of their chronic medical problems, including CAD, htn, hyperlipidemia, prediabetes.  He is exercising regularly.   He swims once a week.  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   He is eating a low sugar diet.    Medications and allergies reviewed with patient and updated if appropriate.  Patient Active Problem List   Diagnosis Date Noted  . Skin cancer 07/24/2018  . Non-recurrent unilateral inguinal hernia without obstruction or gangrene 09/12/2017  . Prediabetes 07/04/2016  . CAD (coronary artery disease) 01/04/2016  . History of ST elevation myocardial infarction (STEMI) 01/04/2016  . NEPHROLITHIASIS, HX OF 10/22/2008  . CHOLELITHIASIS 11/08/2007  . Essential hypertension 10/17/2007  . HYPERLIPIDEMIA 12/13/2006  . ELEVATED PROSTATE SPECIFIC ANTIGEN 09/16/2006    Current Outpatient Medications on File Prior to Visit  Medication Sig Dispense Refill  . aspirin 81 MG tablet Take 81 mg by mouth daily.    Marland Kitchen atorvastatin (LIPITOR) 80 MG tablet TAKE 1 TABLET BY MOUTH ONCE DAILY AT  6  PM 90 tablet 0  . Coenzyme Q10 (COQ10) 100 MG CAPS Take 1 capsule by mouth daily.    . fish oil-omega-3 fatty acids 1000 MG capsule Take 1 g by mouth every other day.     . Flaxseed, Linseed, (FLAXSEED OIL) 1000 MG CAPS Take by mouth.    . losartan (COZAAR) 100 MG tablet Take 1 tablet by mouth once daily 90 tablet 3  . metoprolol tartrate (LOPRESSOR) 25 MG tablet Take 1/2 (one-half) tablet by mouth twice daily 90 tablet 1  . Multiple Vitamin (MULTIVITAMIN) tablet Take 1 tablet by mouth every other day.     . mupirocin ointment (BACTROBAN) 2 % Place 1 application into the nose 2 (two) times daily. 22 g 0  . nitroGLYCERIN (NITROSTAT) 0.4 MG SL tablet Place 1 tablet (0.4 mg total) under the tongue every 5 (five) minutes x 3 doses as needed for chest pain. 25  tablet 5  . amLODipine (NORVASC) 10 MG tablet Take 1 tablet (10 mg total) by mouth daily. 90 tablet 3   No current facility-administered medications on file prior to visit.    Past Medical History:  Diagnosis Date  . Acute MI, inferolateral wall, initial episode of care ([place]) 07/30/2015  . Coronary artery disease   . Elevated PSA    Dr Barnie Del  . Gilbert's syndrome   . History of heat stroke 2009  . History of nephrolithiasis   . Hyperlipidemia    NMR 06/2009: LDL 08(1448/185), HDL 40, TG 127. Framingham Study LDL goal =< 130   . Hypertension   . Osteoarthritis of left hip     Past Surgical History:  Procedure Laterality Date  . CARDIAC CATHETERIZATION    . CARDIAC CATHETERIZATION N/A 07/30/2015   Procedure: Left Heart Cath and Coronary Angiography;  Surgeon: Sherren Mocha, MD;  Location: [facility];  Service: Cardiovascular;  Laterality: N/A;  . CARDIAC CATHETERIZATION N/A 07/30/2015   Procedure: Coronary Stent Intervention;  Surgeon: Sherren Mocha, MD;  Location: [facility];  Service: Cardiovascular;  Laterality: N/A;  Distal RCA- Promus 3.50x16  . COLONOSCOPY  2010   negative  . INGUINAL HERNIA REPAIR Right 10/19/2017   Procedure: RIGHT  INGUINAL HERNIA REPAIR ERAS PATHWAY;  Surgeon: Erroll Luna, MD;  Location: [facility];  Service: General;  Laterality: Right;  Tap block  . INSERTION OF MESH Right 10/19/2017   Procedure: INSERTION OF MESH;  Surgeon: Erroll Luna, MD;  Location: [facility];  Service: General;  Laterality: Right;  . LITHOTRIPSY  2009   Dr.Duckett, [place]   . TOTAL HIP ARTHROPLASTY  2008   left  . TOTAL HIP ARTHROPLASTY  2004   right    Social History   Socioeconomic History  . Marital status: Married    Spouse name: Not on file  . Number of children: 3  . Years of education: Not on file  . Highest education level: Not on file  Occupational History  . Occupation: Chief Strategy Officer  Tobacco Use  .  Smoking status: Former Smoker    Packs/day: 1.00    Years: 32.00    Pack years: 32.00    Start date: 1970    Quit date: 01/26/2000    Years since quitting: 19.5  . Smokeless tobacco: Never Used  . Tobacco comment: Pt stopped a few times.  Vaping Use  . Vaping Use: Never used  Substance and Sexual Activity  . Alcohol use: Yes    Comment: 15 beers/week  . Drug use: No  . Sexual activity: Yes    Birth control/protection: Other-see comments  Other Topics Concern  . Not on file  Social History Narrative  . Not on file   Social Determinants of Health   Financial Resource Strain:   . Difficulty of Paying Living Expenses:   Food Insecurity:   . Worried About Charity fundraiser in the Last Year:   . Arboriculturist in the Last Year:   Transportation Needs:   . Film/video editor (Medical):   Marland Kitchen Lack of Transportation (Non-Medical):   Physical Activity:   . Days of Exercise per Week:   . Minutes of Exercise per Session:   Stress:   . Feeling of Stress :   Social Connections:   . Frequency of Communication with Friends and Family:   . Frequency of Social Gatherings with Friends and Family:   . Attends Religious Services:   . Active Member of Clubs or Organizations:   . Attends Archivist Meetings:   Marland Kitchen Marital Status:     Family History  Problem Relation Age of Onset  . Diabetes Mother   . Hypertension Mother   . Heart attack Father 55       smoker  . Prostate cancer Brother 74       CAD; stents @ 40  . Hypertension Brother   . Heart attack Brother 5  . Coronary artery disease Brother        stents late 24s  . Stroke Neg Hx     Review of Systems  Constitutional: Negative for chills and fever.  Respiratory: Positive for cough (occ, intermittent -dry - usually with drinking and talking). Negative for shortness of breath and wheezing.   Cardiovascular: Negative for chest pain, palpitations and leg swelling.  Musculoskeletal: Positive for arthralgias  (chronic aches) and myalgias (chronic aches).  Neurological: Positive for light-headedness (occ). Negative for headaches.       Objective:   Vitals:   08/06/19 0806  BP: 138/84  Pulse: 94  Temp: 98.2 F (36.8 C)  SpO2: 97%   BP Readings from Last 3 Encounters:  08/06/19 138/84  01/29/19 (!) 142/90  12/25/18 (!) 142/86   Wt Readings from Last 3 Encounters:  08/06/19 223 lb (101.2 kg)  01/29/19 230 lb 9.6 oz (104.6 kg)  12/25/18 229 lb 9.6 oz (104.1 kg)   Body mass index is 31.1 kg/m.   Physical Exam    Constitutional: Appears well-developed and well-nourished. No distress.  HENT:  Head: Normocephalic and atraumatic.  Neck: Neck supple. No tracheal deviation present. No thyromegaly present.  No cervical lymphadenopathy Cardiovascular: Normal rate, regular rhythm and normal heart sounds.  No murmur heard. No carotid bruit .  No edema Pulmonary/Chest: Effort normal and breath sounds normal. No respiratory distress. No has no wheezes. No rales.  GU: normal size prostate w/o nodules  Skin: Skin is warm and dry. Not diaphoretic.  Psychiatric: Normal mood and affect. Behavior is normal.      Assessment & Plan:    See Problem List for Assessment and Plan of chronic medical problems.    This visit occurred during the SARS-CoV-2 public health emergency.  Safety protocols were in place, including screening questions prior to the visit, additional usage of staff PPE, and extensive cleaning of exam room while observing appropriate contact time as indicated for disinfecting solutions.

## 2019-08-06 ENCOUNTER — Other Ambulatory Visit: Payer: Self-pay

## 2019-08-06 ENCOUNTER — Ambulatory Visit (INDEPENDENT_AMBULATORY_CARE_PROVIDER_SITE_OTHER): Payer: Medicare Other | Admitting: Internal Medicine

## 2019-08-06 ENCOUNTER — Encounter: Payer: Self-pay | Admitting: Internal Medicine

## 2019-08-06 VITALS — BP 138/84 | HR 94 | Temp 98.2°F | Ht 71.0 in | Wt 223.0 lb

## 2019-08-06 DIAGNOSIS — I2510 Atherosclerotic heart disease of native coronary artery without angina pectoris: Secondary | ICD-10-CM

## 2019-08-06 DIAGNOSIS — R7303 Prediabetes: Secondary | ICD-10-CM

## 2019-08-06 DIAGNOSIS — E782 Mixed hyperlipidemia: Secondary | ICD-10-CM | POA: Diagnosis not present

## 2019-08-06 DIAGNOSIS — I10 Essential (primary) hypertension: Secondary | ICD-10-CM | POA: Diagnosis not present

## 2019-08-06 DIAGNOSIS — Z125 Encounter for screening for malignant neoplasm of prostate: Secondary | ICD-10-CM | POA: Diagnosis not present

## 2019-08-06 LAB — PSA, MEDICARE: PSA: 2.37 ng/ml (ref 0.10–4.00)

## 2019-08-06 NOTE — Assessment & Plan Note (Signed)
Chronic Check lipid panel  Continue daily statin Regular exercise and healthy diet encouraged  

## 2019-08-06 NOTE — Assessment & Plan Note (Addendum)
Chronic No concerning symptoms Continue current medications

## 2019-08-06 NOTE — Assessment & Plan Note (Signed)
Chronic Check a1c Low sugar / carb diet Stressed regular exercise  

## 2019-08-06 NOTE — Assessment & Plan Note (Signed)
Chronic BP controlled - borderline high Current regimen effective and well tolerated Continue current medications at current doses cmp

## 2019-08-07 LAB — COMPREHENSIVE METABOLIC PANEL
AG Ratio: 2.2 (calc) (ref 1.0–2.5)
ALT: 28 U/L (ref 9–46)
AST: 21 U/L (ref 10–35)
Albumin: 4.6 g/dL (ref 3.6–5.1)
Alkaline phosphatase (APISO): 73 U/L (ref 35–144)
BUN: 11 mg/dL (ref 7–25)
CO2: 25 mmol/L (ref 20–32)
Calcium: 9.1 mg/dL (ref 8.6–10.3)
Chloride: 105 mmol/L (ref 98–110)
Creat: 0.8 mg/dL (ref 0.70–1.25)
Globulin: 2.1 g/dL (calc) (ref 1.9–3.7)
Glucose, Bld: 125 mg/dL — ABNORMAL HIGH (ref 65–99)
Potassium: 4.3 mmol/L (ref 3.5–5.3)
Sodium: 140 mmol/L (ref 135–146)
Total Bilirubin: 0.7 mg/dL (ref 0.2–1.2)
Total Protein: 6.7 g/dL (ref 6.1–8.1)

## 2019-08-07 LAB — CBC WITH DIFFERENTIAL/PLATELET
Absolute Monocytes: 439 cells/uL (ref 200–950)
Basophils Absolute: 29 cells/uL (ref 0–200)
Basophils Relative: 0.5 %
Eosinophils Absolute: 120 cells/uL (ref 15–500)
Eosinophils Relative: 2.1 %
HCT: 45.2 % (ref 38.5–50.0)
Hemoglobin: 15.4 g/dL (ref 13.2–17.1)
Lymphs Abs: 1471 cells/uL (ref 850–3900)
MCH: 33 pg (ref 27.0–33.0)
MCHC: 34.1 g/dL (ref 32.0–36.0)
MCV: 97 fL (ref 80.0–100.0)
MPV: 10.2 fL (ref 7.5–12.5)
Monocytes Relative: 7.7 %
Neutro Abs: 3642 cells/uL (ref 1500–7800)
Neutrophils Relative %: 63.9 %
Platelets: 260 10*3/uL (ref 140–400)
RBC: 4.66 10*6/uL (ref 4.20–5.80)
RDW: 12.5 % (ref 11.0–15.0)
Total Lymphocyte: 25.8 %
WBC: 5.7 10*3/uL (ref 3.8–10.8)

## 2019-08-07 LAB — LIPID PANEL
Cholesterol: 99 mg/dL (ref <200)
HDL: 35 mg/dL — ABNORMAL LOW (ref >=40)
LDL Cholesterol (Calc): 43 mg/dL (calc)
Non-HDL Cholesterol (Calc): 64 mg/dL (calc) (ref <130)
Total CHOL/HDL Ratio: 2.8 (calc) (ref <5.0)
Triglycerides: 120 mg/dL (ref <150)

## 2019-08-07 LAB — TSH: TSH: 1.41 mIU/L (ref 0.40–4.50)

## 2019-08-07 LAB — HEMOGLOBIN A1C
Hgb A1c MFr Bld: 5.7 % of total Hgb — ABNORMAL HIGH (ref <5.7)
Mean Plasma Glucose: 117 (calc)
eAG (mmol/L): 6.5 (calc)

## 2019-08-14 ENCOUNTER — Other Ambulatory Visit: Payer: Self-pay | Admitting: Internal Medicine

## 2019-08-26 ENCOUNTER — Other Ambulatory Visit: Payer: Self-pay | Admitting: Internal Medicine

## 2019-09-12 ENCOUNTER — Other Ambulatory Visit: Payer: Self-pay | Admitting: Internal Medicine

## 2019-11-09 ENCOUNTER — Other Ambulatory Visit: Payer: Self-pay | Admitting: Nurse Practitioner

## 2019-11-20 DIAGNOSIS — L905 Scar conditions and fibrosis of skin: Secondary | ICD-10-CM | POA: Diagnosis not present

## 2019-11-20 DIAGNOSIS — Z85828 Personal history of other malignant neoplasm of skin: Secondary | ICD-10-CM | POA: Diagnosis not present

## 2019-11-20 DIAGNOSIS — L570 Actinic keratosis: Secondary | ICD-10-CM | POA: Diagnosis not present

## 2019-11-22 DIAGNOSIS — Z23 Encounter for immunization: Secondary | ICD-10-CM | POA: Diagnosis not present

## 2019-12-04 ENCOUNTER — Other Ambulatory Visit: Payer: Self-pay | Admitting: Internal Medicine

## 2019-12-10 ENCOUNTER — Other Ambulatory Visit: Payer: Self-pay | Admitting: Internal Medicine

## 2019-12-31 ENCOUNTER — Encounter: Payer: Self-pay | Admitting: Physician Assistant

## 2019-12-31 ENCOUNTER — Other Ambulatory Visit: Payer: Self-pay

## 2019-12-31 ENCOUNTER — Ambulatory Visit (INDEPENDENT_AMBULATORY_CARE_PROVIDER_SITE_OTHER): Payer: Medicare Other | Admitting: Physician Assistant

## 2019-12-31 ENCOUNTER — Ambulatory Visit: Payer: Medicare Other | Admitting: Physician Assistant

## 2019-12-31 VITALS — BP 122/76 | HR 76 | Ht 71.0 in | Wt 227.0 lb

## 2019-12-31 DIAGNOSIS — E782 Mixed hyperlipidemia: Secondary | ICD-10-CM | POA: Diagnosis not present

## 2019-12-31 DIAGNOSIS — I2510 Atherosclerotic heart disease of native coronary artery without angina pectoris: Secondary | ICD-10-CM | POA: Diagnosis not present

## 2019-12-31 DIAGNOSIS — I10 Essential (primary) hypertension: Secondary | ICD-10-CM | POA: Diagnosis not present

## 2019-12-31 NOTE — Progress Notes (Signed)
Cardiology Office Note:    Date:  12/31/2019   ID:  Mike Ray, DOB 1950/06/02, MRN [id number]  PCP:  Binnie Rail, MD  [facility] Cardiologist:  Sherren Mocha, MD   [facility] Electrophysiologist:  None   Referring MD: Binnie Rail, MD   Chief Complaint:  Follow-up (CAD)    Patient Profile:    Mike Ray is a 69 y.o. male with:   Coronary artery disease  S/p inferolateral MI in 2017>> PCI to the RCA  Myoview 2019: no ischemia  Echo 9/19: EF 55-60  Hypertension  Hyperlipidemia  Gilbert's syndrome  Prior CV studies: Echocardiogram 10/13/2017 EF 55-60, GR 1 DD, normal wall motion, moderate LAE  Myoview 10/11/2017 EF 38, large inferior infarct, small anteroseptal infarct; no ischemia; intermediate risk due to low EF  Cardiac catheterization 07/30/2015 LAD mid 30 LCx proximal and mid 25 RCA distal occluded with heavy thrombus EF 50-55 PCI: 3.5 x 16 mm Promus DES to the distal RCA  History of Present Illness:    Mike Ray was last seen by Dr. Burt Knack in 11/2018.  He continued to have atypical anginal symptoms which were unchanged since his stress test in 2019 which showed no ischemia.  Med Rx was continued.  He returns for follow-up.  He is here alone.  He has been doing well without chest discomfort, significant shortness of breath, orthopnea, leg swelling or syncope.  He swims every 3 days.  He has his own landscaping business and continues to remain active with that.         Past Medical History:  Diagnosis Date  . Acute MI, inferolateral wall, initial episode of care ([place]) 07/30/2015  . Coronary artery disease   . Elevated PSA    Dr Barnie Del  . Gilbert's syndrome   . History of heat stroke 2009  . History of nephrolithiasis   . Hyperlipidemia    NMR 06/2009: LDL 63(8756/433), HDL 40, TG 127. Framingham Study LDL goal =< 130   . Hypertension   . Osteoarthritis of left hip     Current Medications: Current Meds  Medication Sig  .  amLODipine (NORVASC) 10 MG tablet Take 1 tablet by mouth once daily  . aspirin 81 MG tablet Take 81 mg by mouth daily.  Marland Kitchen atorvastatin (LIPITOR) 80 MG tablet TAKE 1 TABLET BY MOUTH ONCE DAILY AT  6  PM  . Coenzyme Q10 (COQ10) 100 MG CAPS Take 1 capsule by mouth daily.  . fish oil-omega-3 fatty acids 1000 MG capsule Take 1 g by mouth every other day.   . Flaxseed, Linseed, (FLAXSEED OIL) 1000 MG CAPS Take by mouth.  . losartan (COZAAR) 100 MG tablet Take 1 tablet by mouth once daily  . metoprolol tartrate (LOPRESSOR) 25 MG tablet Take 1/2 (one-half) tablet by mouth twice daily  . Multiple Vitamin (MULTIVITAMIN) tablet Take 1 tablet by mouth every other day.   . mupirocin ointment (BACTROBAN) 2 % Place 1 application into the nose 2 (two) times daily.  . nitroGLYCERIN (NITROSTAT) 0.4 MG SL tablet Place 1 tablet (0.4 mg total) under the tongue every 5 (five) minutes x 3 doses as needed for chest pain.     Allergies:   Naproxen sodium   Social History   Tobacco Use  . Smoking status: Former Smoker    Packs/day: 1.00    Years: 32.00    Pack years: 32.00    Start date: 1970    Quit date: 01/26/2000  Years since quitting: 19.9  . Smokeless tobacco: Never Used  . Tobacco comment: Pt stopped a few times.  Vaping Use  . Vaping Use: Never used  Substance Use Topics  . Alcohol use: Yes    Comment: 15 beers/week  . Drug use: No     Family Hx: The patient's family history includes Coronary artery disease in his brother; Diabetes in his mother; Heart attack (age of onset: 59) in his father; Heart attack (age of onset: 49) in his brother; Hypertension in his brother and mother; Prostate cancer (age of onset: 93) in his brother. There is no history of Stroke.  ROS   EKGs/Labs/Other Test Reviewed:    EKG:  EKG is   ordered today.  The ekg ordered today demonstrates normal sinus rhythm, rate 76, inferior Q waves, PAC, nonspecific ST-T wave changes, QTC 420  Recent Labs: 08/06/2019: ALT 28;  BUN 11; Creat 0.80; Hemoglobin 15.4; Platelets 260; Potassium 4.3; Sodium 140; TSH 1.41   Recent Lipid Panel Lab Results  Component Value Date/Time   CHOL 99 08/06/2019 08:48 AM   TRIG 120 08/06/2019 08:48 AM   HDL 35 (L) 08/06/2019 08:48 AM   CHOLHDL 2.8 08/06/2019 08:48 AM   LDLCALC 43 08/06/2019 08:48 AM   LDLDIRECT 117.0 07/10/2014 09:16 AM      Risk Assessment/Calculations:      Physical Exam:    VS:  BP 122/76   Pulse 76   Ht 5\' 11"  (1.803 m)   Wt 227 lb (103 kg)   SpO2 97%   BMI 31.66 kg/m     Wt Readings from Last 3 Encounters:  12/31/19 227 lb (103 kg)  08/06/19 223 lb (101.2 kg)  01/29/19 230 lb 9.6 oz (104.6 kg)     Constitutional:      Appearance: Healthy appearance. Not in distress.  Neck:     Thyroid: No thyromegaly.     Vascular: JVD normal.  Pulmonary:     Effort: Pulmonary effort is normal.     Breath sounds: No wheezing. No rales.  Cardiovascular:     Normal rate. Regular rhythm. Normal S1. Normal S2.     Murmurs: There is no murmur.  Pulses:    Intact distal pulses.  Edema:    Peripheral edema absent.  Abdominal:     Palpations: Abdomen is soft. There is no hepatomegaly.  Skin:    General: Skin is warm and dry.  Neurological:     General: No focal deficit present.     Mental Status: Alert and oriented to person, place and time.     Cranial Nerves: Cranial nerves are intact.       ASSESSMENT & PLAN:    1. Coronary artery disease involving native coronary artery of native heart without angina pectoris History of inferolateral myocardial infarction in 2017 treated with a drug-eluting stent to the RCA.  Myoview in 2019 was negative for ischemia.  EF was normal by echocardiogram.  He is doing well without anginal symptoms.  He does have an occasional palpitation.  He had a PAC on electrocardiogram today.  I suspect he has symptomatic PACs at times.  Continue current dose of aspirin, atorvastatin, metoprolol tartrate.  Follow-up in 1  year.  2. Essential hypertension The patient's blood pressure is controlled on his current regimen.  Continue current therapy.   3. Mixed hyperlipidemia LDL optimal on most recent lab work.  Continue current Rx.       Dispo:  Return in about 1  year (around 12/30/2020) for Routine Follow Up, w/ Dr. Burt Knack, in person.   Medication Adjustments/Labs and Tests Ordered: Current medicines are reviewed at length with the patient today.  Concerns regarding medicines are outlined above.  Tests Ordered: Orders Placed This Encounter  Procedures  . EKG 12-Lead   Medication Changes: No orders of the defined types were placed in this encounter.   Signed, Richardson Dopp, PA-C  12/31/2019 5:47 PM    [facility] [address], [place], [place]  [address] Phone: [phone]; Fax: [phone]

## 2019-12-31 NOTE — Patient Instructions (Signed)
Medication Instructions:  Your physician recommends that you continue on your current medications as directed. Please refer to the Current Medication list given to you today.  *If you need a refill on your cardiac medications before your next appointment, please call your pharmacy*  Lab Work: None ordered today  Testing/Procedures: None ordered today  Follow-Up: At [facility], you and your health needs are our priority.  As part of our continuing mission to provide you with exceptional heart care, we have created designated Provider Care Teams.  These Care Teams include your primary Cardiologist (physician) and Advanced Practice Providers (APPs -  Physician Assistants and Nurse Practitioners) who all work together to provide you with the care you need, when you need it.  Your next appointment:   12 month(s)  The format for your next appointment:   In Person  Provider:   Sherren Mocha, MD

## 2020-01-01 ENCOUNTER — Encounter: Payer: Self-pay | Admitting: Internal Medicine

## 2020-02-06 NOTE — Patient Instructions (Addendum)
  Blood work was ordered.     Medications changes include :   none  Your prescription(s) have been submitted to your pharmacy. Please take as directed and contact our office if you believe you are having problem(s) with the medication(s).   A referral was ordered for GI for your colonoscopy.      Someone from their office will call you to schedule an appointment.    Please followup in 6 months

## 2020-02-06 NOTE — Progress Notes (Signed)
Subjective:    Patient ID: Mike Ray, male    DOB: 1951-01-07, 70 y.o.   MRN: [id number]  HPI The patient is here for follow up of their chronic medical problems, including CAD, htn, hyperlipidemia, prediabetes  He is exercising regularly.   He swims and has a Radio broadcast assistant.    Due for colonoscopy - Dr Juanda Chance did last one   He take ibuprofen as needed - at most 3 tabs a few times a week.  He tries to take tylenol more regularly.     Medications and allergies reviewed with patient and updated if appropriate.  Patient Active Problem List   Diagnosis Date Noted  . Skin cancer 07/24/2018  . Non-recurrent unilateral inguinal hernia without obstruction or gangrene 09/12/2017  . Prediabetes 07/04/2016  . CAD (coronary artery disease) 01/04/2016  . History of ST elevation myocardial infarction (STEMI) 01/04/2016  . NEPHROLITHIASIS, HX OF 10/22/2008  . CHOLELITHIASIS 11/08/2007  . Essential hypertension 10/17/2007  . HYPERLIPIDEMIA 12/13/2006  . ELEVATED PROSTATE SPECIFIC ANTIGEN 09/16/2006    Current Outpatient Medications on File Prior to Visit  Medication Sig Dispense Refill  . amLODipine (NORVASC) 10 MG tablet Take 1 tablet by mouth once daily 90 tablet 0  . aspirin 81 MG tablet Take 81 mg by mouth daily.    Marland Kitchen atorvastatin (LIPITOR) 80 MG tablet TAKE 1 TABLET BY MOUTH ONCE DAILY AT  6  PM 90 tablet 0  . Coenzyme Q10 (COQ10) 100 MG CAPS Take 1 capsule by mouth daily.    . fish oil-omega-3 fatty acids 1000 MG capsule Take 1 g by mouth every other day.     . Flaxseed, Linseed, (FLAXSEED OIL) 1000 MG CAPS Take by mouth.    . losartan (COZAAR) 100 MG tablet Take 1 tablet by mouth once daily 90 tablet 0  . metoprolol tartrate (LOPRESSOR) 25 MG tablet Take 1/2 (one-half) tablet by mouth twice daily 90 tablet 1  . Multiple Vitamin (MULTIVITAMIN) tablet Take 1 tablet by mouth every other day.     . mupirocin ointment (BACTROBAN) 2 % Place 1 application into the nose 2  (two) times daily. 22 g 0  . nitroGLYCERIN (NITROSTAT) 0.4 MG SL tablet Place 1 tablet (0.4 mg total) under the tongue every 5 (five) minutes x 3 doses as needed for chest pain. 25 tablet 5   No current facility-administered medications on file prior to visit.    Past Medical History:  Diagnosis Date  . Acute MI, inferolateral wall, initial episode of care (HCC) 07/30/2015  . Coronary artery disease   . Elevated PSA    Dr Edwin Cap  . Gilbert's syndrome   . History of heat stroke 2009  . History of nephrolithiasis   . Hyperlipidemia    NMR 06/2009: LDL 40(9811/914), HDL 40, TG 127. Framingham Study LDL goal =< 130   . Hypertension   . Osteoarthritis of left hip     Past Surgical History:  Procedure Laterality Date  . CARDIAC CATHETERIZATION    . CARDIAC CATHETERIZATION N/A 07/30/2015   Procedure: Left Heart Cath and Coronary Angiography;  Surgeon: Tonny Bollman, MD;  Location: [facility] INVASIVE CV LAB;  Service: Cardiovascular;  Laterality: N/A;  . CARDIAC CATHETERIZATION N/A 07/30/2015   Procedure: Coronary Stent Intervention;  Surgeon: Tonny Bollman, MD;  Location: [facility] INVASIVE CV LAB;  Service: Cardiovascular;  Laterality: N/A;  Distal RCA- Promus 3.50x16  . COLONOSCOPY  2010   negative  . INGUINAL HERNIA REPAIR Right 10/19/2017  Procedure: RIGHT  INGUINAL HERNIA REPAIR ERAS PATHWAY;  Surgeon: Harriette Bouillon, MD;  Location: [facility];  Service: General;  Laterality: Right;  Tap block  . INSERTION OF MESH Right 10/19/2017   Procedure: INSERTION OF MESH;  Surgeon: Harriette Bouillon, MD;  Location: [facility];  Service: General;  Laterality: Right;  . LITHOTRIPSY  2009   Dr.Duckett, [place]   . TOTAL HIP ARTHROPLASTY  2008   left  . TOTAL HIP ARTHROPLASTY  2004   right    Social History   Socioeconomic History  . Marital status: Married    Spouse name: Not on file  . Number of children: 3  . Years of education: Not on file  . Highest education  level: Not on file  Occupational History  . Occupation: Surveyor, minerals  Tobacco Use  . Smoking status: Former Smoker    Packs/day: 1.00    Years: 32.00    Pack years: 32.00    Start date: 1970    Quit date: 01/26/2000    Years since quitting: 20.0  . Smokeless tobacco: Never Used  . Tobacco comment: Pt stopped a few times.  Vaping Use  . Vaping Use: Never used  Substance and Sexual Activity  . Alcohol use: Yes    Comment: 15 beers/week  . Drug use: No  . Sexual activity: Yes    Birth control/protection: Other-see comments  Other Topics Concern  . Not on file  Social History Narrative  . Not on file   Social Determinants of Health   Financial Resource Strain: Not on file  Food Insecurity: Not on file  Transportation Needs: Not on file  Physical Activity: Not on file  Stress: Not on file  Social Connections: Not on file    Family History  Problem Relation Age of Onset  . Diabetes Mother   . Hypertension Mother   . Heart attack Father 77       smoker  . Prostate cancer Brother 65       CAD; stents @ 56  . Hypertension Brother   . Heart attack Brother 4  . Coronary artery disease Brother        stents late 10s  . Stroke Neg Hx     Review of Systems  Constitutional: Negative for chills and fever.  Respiratory: Negative for cough, shortness of breath and wheezing.   Cardiovascular: Negative for chest pain, palpitations and leg swelling.  Musculoskeletal: Positive for arthralgias (hip bursitis).  Neurological: Positive for light-headedness (occ). Negative for headaches.       Objective:   Vitals:   02/07/20 0748  BP: 122/74  Pulse: 78  Temp: 98.1 F (36.7 C)  SpO2: 97%   BP Readings from Last 3 Encounters:  02/07/20 122/74  12/31/19 122/76  08/06/19 138/84   Wt Readings from Last 3 Encounters:  02/07/20 223 lb (101.2 kg)  12/31/19 227 lb (103 kg)  08/06/19 223 lb (101.2 kg)   Body mass index is 31.1 kg/m.   Physical Exam    Constitutional:  Appears well-developed and well-nourished. No distress.  HENT:  Head: Normocephalic and atraumatic.  Neck: Neck supple. No tracheal deviation present. No thyromegaly present.  No cervical lymphadenopathy Cardiovascular: Normal rate, regular rhythm and normal heart sounds.   No murmur heard. No carotid bruit .  No edema Pulmonary/Chest: Effort normal and breath sounds normal. No respiratory distress. No has no wheezes. No rales.  Skin: Skin is warm and dry. Not diaphoretic.  Psychiatric: Normal mood and affect. Behavior is normal.      Assessment & Plan:    See Problem List for Assessment and Plan of chronic medical problems.    This visit occurred during the SARS-CoV-2 public health emergency.  Safety protocols were in place, including screening questions prior to the visit, additional usage of staff PPE, and extensive cleaning of exam room while observing appropriate contact time as indicated for disinfecting solutions.

## 2020-02-07 ENCOUNTER — Encounter: Payer: Self-pay | Admitting: Internal Medicine

## 2020-02-07 ENCOUNTER — Ambulatory Visit (INDEPENDENT_AMBULATORY_CARE_PROVIDER_SITE_OTHER): Payer: Medicare Other | Admitting: Internal Medicine

## 2020-02-07 ENCOUNTER — Other Ambulatory Visit: Payer: Self-pay

## 2020-02-07 VITALS — BP 122/74 | HR 78 | Temp 98.1°F | Ht 71.0 in | Wt 223.0 lb

## 2020-02-07 DIAGNOSIS — Z1211 Encounter for screening for malignant neoplasm of colon: Secondary | ICD-10-CM

## 2020-02-07 DIAGNOSIS — I2510 Atherosclerotic heart disease of native coronary artery without angina pectoris: Secondary | ICD-10-CM

## 2020-02-07 DIAGNOSIS — E782 Mixed hyperlipidemia: Secondary | ICD-10-CM

## 2020-02-07 DIAGNOSIS — I10 Essential (primary) hypertension: Secondary | ICD-10-CM

## 2020-02-07 DIAGNOSIS — R7303 Prediabetes: Secondary | ICD-10-CM | POA: Diagnosis not present

## 2020-02-07 LAB — COMPREHENSIVE METABOLIC PANEL
ALT: 38 U/L (ref 0–53)
AST: 32 U/L (ref 0–37)
Albumin: 4.9 g/dL (ref 3.5–5.2)
Alkaline Phosphatase: 56 U/L (ref 39–117)
BUN: 13 mg/dL (ref 6–23)
CO2: 27 mEq/L (ref 19–32)
Calcium: 9.7 mg/dL (ref 8.4–10.5)
Chloride: 104 mEq/L (ref 96–112)
Creatinine, Ser: 0.9 mg/dL (ref 0.40–1.50)
GFR: 87.01 mL/min (ref >60.00)
Glucose, Bld: 121 mg/dL — ABNORMAL HIGH (ref 70–99)
Potassium: 4.3 mEq/L (ref 3.5–5.1)
Sodium: 138 mEq/L (ref 135–145)
Total Bilirubin: 1.3 mg/dL — ABNORMAL HIGH (ref 0.2–1.2)
Total Protein: 7.3 g/dL (ref 6.0–8.3)

## 2020-02-07 LAB — LIPID PANEL
Cholesterol: 90 mg/dL (ref 0–200)
HDL: 33.6 mg/dL — ABNORMAL LOW (ref >39.00)
LDL Cholesterol: 33 mg/dL (ref 0–99)
NonHDL: 55.94
Total CHOL/HDL Ratio: 3
Triglycerides: 117 mg/dL (ref 0.0–149.0)
VLDL: 23.4 mg/dL (ref 0.0–40.0)

## 2020-02-07 LAB — HEMOGLOBIN A1C: Hgb A1c MFr Bld: 5.9 % (ref 4.6–6.5)

## 2020-02-07 NOTE — Addendum Note (Signed)
Addended by: Boris Lown B on: 02/07/2020 08:17 AM   Modules accepted: Orders

## 2020-02-07 NOTE — Assessment & Plan Note (Signed)
Chronic Check lipid panel  Continue atorvastatin 80 mg dialy Regular exercise and healthy diet encouraged

## 2020-02-07 NOTE — Assessment & Plan Note (Signed)
Chronic BP well controlled Continue amlodipine 10 mg daily, losartan 100 mg, metoprolol 12.5 mg BID  cmp

## 2020-02-07 NOTE — Assessment & Plan Note (Signed)
Chronic Check a1c Low sugar / carb diet Stressed regular exercise  

## 2020-02-07 NOTE — Assessment & Plan Note (Signed)
Chronic No concerning symptoms continue current medications

## 2020-02-11 ENCOUNTER — Other Ambulatory Visit: Payer: Self-pay | Admitting: Cardiovascular Disease

## 2020-02-12 ENCOUNTER — Encounter: Payer: Self-pay | Admitting: Gastroenterology

## 2020-02-15 MED ORDER — LOSARTAN POTASSIUM 100 MG PO TABS: 100.0000 mg | ORAL_TABLET | Freq: Every day | ORAL | 2 refills | Status: DC

## 2020-02-15 NOTE — Telephone Encounter (Signed)
Called [facility] who reports they don't have Losartan 50 mg nor 100 mg tablets. [facility] called, they do have stock. Called pt and made aware I will send in Rx to [facility].  Advised pt to follow up with [facility] on when they get stock back in and we can then send Rx to [facility] if he prefers there. Patient verbalized understanding and agreeable to plan.

## 2020-02-18 ENCOUNTER — Other Ambulatory Visit: Payer: Self-pay | Admitting: Internal Medicine

## 2020-02-25 ENCOUNTER — Ambulatory Visit (AMBULATORY_SURGERY_CENTER): Payer: Medicare Other

## 2020-02-25 VITALS — Ht 71.0 in | Wt 222.0 lb

## 2020-02-25 DIAGNOSIS — Z1211 Encounter for screening for malignant neoplasm of colon: Secondary | ICD-10-CM

## 2020-02-25 NOTE — Progress Notes (Signed)
No egg or soy allergy known to patient  No issues with past sedation with any surgeries or procedures No intubation problems in the past  No FH of Malignant Hyperthermia No diet pills per patient No home 02 use per patient  No blood thinners per patient  Pt denies issues with constipation  No A fib or A flutter  EMMI video to pt or via [place] 19 guidelines implemented in PV today with Pt and RN  Pt is fully vaccinated  for Covid   Virtual pre-visit  Due to the COVID-19 pandemic we are asking patients to follow certain guidelines.  Pt aware of COVID protocols and [facility] guidelines

## 2020-03-03 ENCOUNTER — Other Ambulatory Visit: Payer: Self-pay | Admitting: Internal Medicine

## 2020-03-10 ENCOUNTER — Encounter: Payer: Medicare Other | Admitting: Gastroenterology

## 2020-03-12 ENCOUNTER — Ambulatory Visit (AMBULATORY_SURGERY_CENTER): Payer: Medicare Other | Admitting: Gastroenterology

## 2020-03-12 ENCOUNTER — Encounter: Payer: Self-pay | Admitting: Gastroenterology

## 2020-03-12 ENCOUNTER — Other Ambulatory Visit: Payer: Self-pay

## 2020-03-12 VITALS — BP 105/70 | HR 72 | Temp 97.5°F | Resp 14 | Ht 71.0 in | Wt 222.0 lb

## 2020-03-12 DIAGNOSIS — Z1211 Encounter for screening for malignant neoplasm of colon: Secondary | ICD-10-CM | POA: Diagnosis not present

## 2020-03-12 DIAGNOSIS — D125 Benign neoplasm of sigmoid colon: Secondary | ICD-10-CM | POA: Diagnosis not present

## 2020-03-12 DIAGNOSIS — K5730 Diverticulosis of large intestine without perforation or abscess without bleeding: Secondary | ICD-10-CM

## 2020-03-12 DIAGNOSIS — Z800 Family history of malignant neoplasm of digestive organs: Secondary | ICD-10-CM | POA: Diagnosis not present

## 2020-03-12 MED ORDER — SODIUM CHLORIDE 0.9 % IV SOLN: 500.0000 mL | Freq: Once | INTRAVENOUS | Status: DC

## 2020-03-12 NOTE — Op Note (Signed)
[place] Patient Name: Mike Ray Procedure Date: 03/12/2020 10:37 AM MRN: [id number] Endoscopist: Gerrit Heck , MD Age: 70 Referring MD:  Date of Birth: 1950-09-02 Gender: Male Account #: [id number] Procedure:                Colonoscopy Indications:              Screening for colorectal malignant neoplasm. Last                            colonoscopy was 12/2009 and notable for sigmoid                            diverticulosis.                           Family history notable for brother with Colon                            Cancer diagnosed in his 68's. Medicines:                Monitored Anesthesia Care Procedure:                Pre-Anesthesia Assessment:                           - Prior to the procedure, a History and Physical                            was performed, and patient medications and                            allergies were reviewed. The patient's tolerance of                            previous anesthesia was also reviewed. The risks                            and benefits of the procedure and the sedation                            options and risks were discussed with the patient.                            All questions were answered, and informed consent                            was obtained. Prior Anticoagulants: The patient has                            taken no previous anticoagulant or antiplatelet                            agents. ASA Grade Assessment: II - A patient with  mild systemic disease. After reviewing the risks                            and benefits, the patient was deemed in                            satisfactory condition to undergo the procedure.                           After obtaining informed consent, the colonoscope                            was passed under direct vision. Throughout the                            procedure, the patient's blood pressure, pulse, and                             oxygen saturations were monitored continuously. The                            Olympus CF-[id number] (#[id number]) Colonoscope was                            introduced through the anus and advanced to the the                            terminal ileum. The colonoscopy was performed                            without difficulty. The patient tolerated the                            procedure well. The quality of the bowel                            preparation was good. The terminal ileum, ileocecal                            valve, appendiceal orifice, and rectum were                            photographed. Scope In: 11:11:05 AM Scope Out: 11:33:10 AM Scope Withdrawal Time: 0 hours 18 minutes 3 seconds  Total Procedure Duration: 0 hours 22 minutes 5 seconds  Findings:                 The perianal and digital rectal examinations were                            normal.                           Four sessile polyps were found in the sigmoid  colon. The polyps were 3 to 5 mm in size. These                            polyps were removed with a cold snare. Resection                            and retrieval were complete. Estimated blood loss                            was minimal.                           Multiple small and large-mouthed diverticula were                            found in the sigmoid colon.                           The exam was otherwise normal throughout the                            remainder of the colon.                           The retroflexed view of the distal rectum and anal                            verge was normal and showed no anal or rectal                            abnormalities.                           The terminal ileum appeared normal. Complications:            No immediate complications. Estimated Blood Loss:     Estimated blood loss was minimal. Impression:               - Four 3 to 5 mm polyps in the sigmoid colon,                             removed with a cold snare. Resected and retrieved.                           - Diverticulosis in the sigmoid colon.                           - The distal rectum and anal verge are normal on                            retroflexion view.                           - The examined portion of the ileum was normal. Recommendation:           -  Patient has a contact number available for                            emergencies. The signs and symptoms of potential                            delayed complications were discussed with the                            patient. Return to normal activities tomorrow.                            Written discharge instructions were provided to the                            patient.                           - Resume previous diet.                           - Continue present medications.                           - Await pathology results.                           - Repeat colonoscopy in 3 - 5 years for                            surveillance based on pathology results.                           - Return to GI office at appointment to be                            scheduled to discuss ongoing evaluation of                            intermittent melena with plan for EGD for                            diagnostic and potentially therapeutic intent                            pending that appointment. Gerrit Heck, MD 03/12/2020 11:39:49 AM

## 2020-03-12 NOTE — Progress Notes (Signed)
To PACU, VSS. Report to [email or web address] 

## 2020-03-12 NOTE — Patient Instructions (Signed)
Handout on polyps provided.  ° °Await pathology results.  ° °YOU HAD AN ENDOSCOPIC PROCEDURE TODAY AT THE [facility]:   Refer to the procedure report that was given to you for any specific questions about what was found during the examination.  If the procedure report does not answer your questions, please call your gastroenterologist to clarify.  If you requested that your care partner not be given the details of your procedure findings, then the procedure report has been included in a sealed envelope for you to review at your convenience later. ° °YOU SHOULD EXPECT: Some feelings of bloating in the abdomen. Passage of more gas than usual.  Walking can help get rid of the air that was put into your GI tract during the procedure and reduce the bloating. If you had a lower endoscopy (such as a colonoscopy or flexible sigmoidoscopy) you may notice spotting of blood in your stool or on the toilet paper. If you underwent a bowel prep for your procedure, you may not have a normal bowel movement for a few days. ° °Please Note:  You might notice some irritation and congestion in your nose or some drainage.  This is from the oxygen used during your procedure.  There is no need for concern and it should clear up in a day or so. ° °SYMPTOMS TO REPORT IMMEDIATELY: ° °Following lower endoscopy (colonoscopy or flexible sigmoidoscopy): ° Excessive amounts of blood in the stool ° Significant tenderness or worsening of abdominal pains ° Swelling of the abdomen that is new, acute ° Fever of 100°F or higher ° ° °For urgent or emergent issues, a gastroenterologist can be reached at any hour by calling [phone]. °Do not use MyChart messaging for urgent concerns.  ° ° °DIET:  We do recommend a small meal at first, but then you may proceed to your regular diet.  Drink plenty of fluids but you should avoid alcoholic beverages for 24 hours. ° °ACTIVITY:  You should plan to take it easy for the rest of today and you  should NOT DRIVE or use heavy machinery until tomorrow (because of the sedation medicines used during the test).   ° °FOLLOW UP: °Our staff will call the number listed on your records 48-72 hours following your procedure to check on you and address any questions or concerns that you may have regarding the information given to you following your procedure. If we do not reach you, we will leave a message.  We will attempt to reach you two times.  During this call, we will ask if you have developed any symptoms of COVID 19. If you develop any symptoms (ie: fever, flu-like symptoms, shortness of breath, cough etc.) before then, please call [phone].  If you test positive for Covid 19 in the 2 weeks post procedure, please call and report this information to us.   ° °If any biopsies were taken you will be contacted by phone or by letter within the next 1-3 weeks.  Please call us at [phone] if you have not heard about the biopsies in 3 weeks.  ° ° °SIGNATURES/CONFIDENTIALITY: °You and/or your care partner have signed paperwork which will be entered into your electronic medical record.  These signatures attest to the fact that that the information above on your After Visit Summary has been reviewed and is understood.  Full responsibility of the confidentiality of this discharge information lies with you and/or your care-partner. ° °

## 2020-03-12 NOTE — Progress Notes (Signed)
Pt's states no medical or surgical changes since previsit or office visit. 

## 2020-03-13 ENCOUNTER — Other Ambulatory Visit: Payer: Self-pay | Admitting: Internal Medicine

## 2020-03-14 ENCOUNTER — Telehealth: Payer: Self-pay | Admitting: *Deleted

## 2020-03-14 ENCOUNTER — Telehealth: Payer: Self-pay

## 2020-03-14 NOTE — Telephone Encounter (Signed)
No answer for post procedure call back. Left message for patient to call with questions or concerns. 

## 2020-03-14 NOTE — Telephone Encounter (Signed)
First post procedure follow up call, no answer 

## 2020-03-17 ENCOUNTER — Encounter: Payer: Self-pay | Admitting: Gastroenterology

## 2020-03-26 ENCOUNTER — Ambulatory Visit (INDEPENDENT_AMBULATORY_CARE_PROVIDER_SITE_OTHER): Payer: Medicare Other

## 2020-03-26 ENCOUNTER — Other Ambulatory Visit: Payer: Self-pay

## 2020-03-26 VITALS — BP 130/80 | HR 71 | Temp 98.3°F | Ht 71.0 in | Wt 224.4 lb

## 2020-03-26 DIAGNOSIS — Z0000 Encounter for general adult medical examination without abnormal findings: Secondary | ICD-10-CM

## 2020-03-26 NOTE — Patient Instructions (Signed)
Mr. Mike Ray , Thank you for taking time to come for your Medicare Wellness Visit. I appreciate your ongoing commitment to your health goals. Please review the following plan we discussed and let me know if I can assist you in the future.   Screening recommendations/referrals: Colonoscopy: 03/12/2020; due every 3 years Recommended yearly ophthalmology/optometry visit for glaucoma screening and checkup Recommended yearly dental visit for hygiene and checkup  Vaccinations: Influenza vaccine: 11/22/2019 Pneumococcal vaccine: 01/05/2016, 01/10/2017 Tdap vaccine: 05/29/2012; due every 10 years Shingles vaccine: never done; an check with local pharmacy.   Covid-19: 03/20/2019, 04/10/2019, 11/22/2019  Advanced directives: Please bring a copy of your health care power of attorney and living will to the office at your convenience.  Conditions/risks identified: Yes; Reviewed health maintenance screenings with patient today and relevant education, vaccines, and/or referrals were provided. Please continue to do your personal lifestyle choices by: daily care of teeth and gums, regular physical activity (goal should be 5 days a week for 30 minutes), eat a healthy diet, avoid tobacco and drug use, limiting any alcohol intake, taking a low-dose aspirin (if not allergic or have been advised by your provider otherwise) and taking vitamins and minerals as recommended by your provider. Continue doing brain stimulating activities (puzzles, reading, adult coloring books, staying active) to keep memory sharp. Continue to eat heart healthy diet (full of fruits, vegetables, whole grains, lean protein, water--limit salt, fat, and sugar intake) and increase physical activity as tolerated.  Next appointment: Please schedule your next Medicare Wellness Visit with your Nurse Health Advisor in 1 year by calling [phone].  Preventive Care 41 Years and Older, Male Preventive care refers to lifestyle choices and visits with your  health care provider that can promote health and wellness. What does preventive care include?  A yearly physical exam. This is also called an annual well check.  Dental exams once or twice a year.  Routine eye exams. Ask your health care provider how often you should have your eyes checked.  Personal lifestyle choices, including:  Daily care of your teeth and gums.  Regular physical activity.  Eating a healthy diet.  Avoiding tobacco and drug use.  Limiting alcohol use.  Practicing safe sex.  Taking low doses of aspirin every day.  Taking vitamin and mineral supplements as recommended by your health care provider. What happens during an annual well check? The services and screenings done by your health care provider during your annual well check will depend on your age, overall health, lifestyle risk factors, and family history of disease. Counseling  Your health care provider may ask you questions about your:  Alcohol use.  Tobacco use.  Drug use.  Emotional well-being.  Home and relationship well-being.  Sexual activity.  Eating habits.  History of falls.  Memory and ability to understand (cognition).  Work and work Statistician. Screening  You may have the following tests or measurements:  Height, weight, and BMI.  Blood pressure.  Lipid and cholesterol levels. These may be checked every 5 years, or more frequently if you are over 76 years old.  Skin check.  Lung cancer screening. You may have this screening every year starting at age 71 if you have a 30-pack-year history of smoking and currently smoke or have quit within the past 15 years.  Fecal occult blood test (FOBT) of the stool. You may have this test every year starting at age 61.  Flexible sigmoidoscopy or colonoscopy. You may have a sigmoidoscopy every 5 years or a  colonoscopy every 10 years starting at age 26.  Prostate cancer screening. Recommendations will vary depending on your family  history and other risks.  Hepatitis C blood test.  Hepatitis B blood test.  Sexually transmitted disease (STD) testing.  Diabetes screening. This is done by checking your blood sugar (glucose) after you have not eaten for a while (fasting). You may have this done every 1-3 years.  Abdominal aortic aneurysm (AAA) screening. You may need this if you are a current or former smoker.  Osteoporosis. You may be screened starting at age 24 if you are at high risk. Talk with your health care provider about your test results, treatment options, and if necessary, the need for more tests. Vaccines  Your health care provider may recommend certain vaccines, such as:  Influenza vaccine. This is recommended every year.  Tetanus, diphtheria, and acellular pertussis (Tdap, Td) vaccine. You may need a Td booster every 10 years.  Zoster vaccine. You may need this after age 19.  Pneumococcal 13-valent conjugate (PCV13) vaccine. One dose is recommended after age 23.  Pneumococcal polysaccharide (PPSV23) vaccine. One dose is recommended after age 82. Talk to your health care provider about which screenings and vaccines you need and how often you need them. This information is not intended to replace advice given to you by your health care provider. Make sure you discuss any questions you have with your health care provider. Document Released: 02/07/2015 Document Revised: 10/01/2015 Document Reviewed: 11/12/2014 Elsevier Interactive Patient Education  2017 [place] Prevention in the Home Falls can cause injuries. They can happen to people of all ages. There are many things you can do to make your home safe and to help prevent falls. What can I do on the outside of my home?  Regularly fix the edges of walkways and driveways and fix any cracks.  Remove anything that might make you trip as you walk through a door, such as a raised step or threshold.  Trim any bushes or trees on the path to  your home.  Use bright outdoor lighting.  Clear any walking paths of anything that might make someone trip, such as rocks or tools.  Regularly check to see if handrails are loose or broken. Make sure that both sides of any steps have handrails.  Any raised decks and porches should have guardrails on the edges.  Have any leaves, snow, or ice cleared regularly.  Use sand or salt on walking paths during winter.  Clean up any spills in your garage right away. This includes oil or grease spills. What can I do in the bathroom?  Use night lights.  Install grab bars by the toilet and in the tub and shower. Do not use towel bars as grab bars.  Use non-skid mats or decals in the tub or shower.  If you need to sit down in the shower, use a plastic, non-slip stool.  Keep the floor dry. Clean up any water that spills on the floor as soon as it happens.  Remove soap buildup in the tub or shower regularly.  Attach bath mats securely with double-sided non-slip rug tape.  Do not have throw rugs and other things on the floor that can make you trip. What can I do in the bedroom?  Use night lights.  Make sure that you have a light by your bed that is easy to reach.  Do not use any sheets or blankets that are too big for your bed. They  should not hang down onto the floor.  Have a firm chair that has side arms. You can use this for support while you get dressed.  Do not have throw rugs and other things on the floor that can make you trip. What can I do in the kitchen?  Clean up any spills right away.  Avoid walking on wet floors.  Keep items that you use a lot in easy-to-reach places.  If you need to reach something above you, use a strong step stool that has a grab bar.  Keep electrical cords out of the way.  Do not use floor polish or wax that makes floors slippery. If you must use wax, use non-skid floor wax.  Do not have throw rugs and other things on the floor that can make  you trip. What can I do with my stairs?  Do not leave any items on the stairs.  Make sure that there are handrails on both sides of the stairs and use them. Fix handrails that are broken or loose. Make sure that handrails are as long as the stairways.  Check any carpeting to make sure that it is firmly attached to the stairs. Fix any carpet that is loose or worn.  Avoid having throw rugs at the top or bottom of the stairs. If you do have throw rugs, attach them to the floor with carpet tape.  Make sure that you have a light switch at the top of the stairs and the bottom of the stairs. If you do not have them, ask someone to add them for you. What else can I do to help prevent falls?  Wear shoes that:  Do not have high heels.  Have rubber bottoms.  Are comfortable and fit you well.  Are closed at the toe. Do not wear sandals.  If you use a stepladder:  Make sure that it is fully opened. Do not climb a closed stepladder.  Make sure that both sides of the stepladder are locked into place.  Ask someone to hold it for you, if possible.  Clearly mark and make sure that you can see:  Any grab bars or handrails.  First and last steps.  Where the edge of each step is.  Use tools that help you move around (mobility aids) if they are needed. These include:  Canes.  Walkers.  Scooters.  Crutches.  Turn on the lights when you go into a dark area. Replace any light bulbs as soon as they burn out.  Set up your furniture so you have a clear path. Avoid moving your furniture around.  If any of your floors are uneven, fix them.  If there are any pets around you, be aware of where they are.  Review your medicines with your doctor. Some medicines can make you feel dizzy. This can increase your chance of falling. Ask your doctor what other things that you can do to help prevent falls. This information is not intended to replace advice given to you by your health care provider.  Make sure you discuss any questions you have with your health care provider. Document Released: 11/07/2008 Document Revised: 06/19/2015 Document Reviewed: 02/15/2014 Elsevier Interactive Patient Education  2017 [employer].

## 2020-03-26 NOTE — Progress Notes (Signed)
Subjective:   Mike Ray is a 70 y.o. male who presents for Medicare Annual/Subsequent preventive examination.  Review of Systems    No ROS. Medicare Wellness Visit. Additional risk factors are reflected in social history. Cardiac Risk Factors include: advanced age (>40men, >78 women);dyslipidemia;hypertension;family history of premature cardiovascular disease;male gender;obesity (BMI >30kg/m2)     Objective:    Today's Vitals   03/26/20 0811 03/26/20 0812  BP: 130/80   Pulse: 71   Temp: 98.3 F (36.8 C)   SpO2: 97%   Weight: 224 lb 6.4 oz (101.8 kg)   Height: 5\' 11"  (1.803 m)   PainSc: 0-No pain 0-No pain   Body mass index is 31.3 kg/m.  Advanced Directives 03/26/2020 01/11/2018 10/19/2017 10/14/2017 01/10/2017 07/30/2015  Does Patient Have a Medical Advance Directive? Yes Yes Yes Yes Yes Yes  Type of Paramedic of [place];Living will [place];Living will Living will Living will [employer];Living will [place];Living will  Does patient want to make changes to medical advance directive? No - Patient declined - No - Patient declined No - Patient declined - No - Patient declined  Copy of [place] in Chart? No - copy requested No - copy requested No - copy requested No - copy requested No - copy requested No - copy requested    Current Medications (verified) Outpatient Encounter Medications as of 03/26/2020  Medication Sig  . acetaminophen (TYLENOL) 500 MG tablet Take 1,000 mg by mouth daily with breakfast.  . amLODipine (NORVASC) 10 MG tablet Take 1 tablet by mouth once daily  . aspirin 81 MG tablet Take 81 mg by mouth daily.  Marland Kitchen atorvastatin (LIPITOR) 80 MG tablet TAKE 1 TABLET BY MOUTH ONCE DAILY AT  6  PM  . Coenzyme Q10 (COQ10) 100 MG CAPS Take 1 capsule by mouth daily.  . Flaxseed, Linseed, (FLAXSEED OIL) 1000 MG CAPS Take by mouth.  . losartan (COZAAR) 100 MG tablet  Take 1 tablet (100 mg total) by mouth daily.  . metoprolol tartrate (LOPRESSOR) 25 MG tablet Take 1/2 (one-half) tablet by mouth twice daily  . Multiple Vitamin (MULTIVITAMIN) tablet Take 1 tablet by mouth every other day.   . fish oil-omega-3 fatty acids 1000 MG capsule Take 1 g by mouth every other day.  (Patient not taking: Reported on 03/12/2020)  . mupirocin ointment (BACTROBAN) 2 % Place 1 application into the nose 2 (two) times daily. (Patient not taking: Reported on 03/12/2020)  . nitroGLYCERIN (NITROSTAT) 0.4 MG SL tablet Place 1 tablet (0.4 mg total) under the tongue every 5 (five) minutes x 3 doses as needed for chest pain. (Patient not taking: Reported on 03/12/2020)   No facility-administered encounter medications on file as of 03/26/2020.    Allergies (verified) Patient has no known allergies.   History: Past Medical History:  Diagnosis Date  . Acute MI, inferolateral wall, initial episode of care ([place]) 07/30/2015  . Coronary artery disease   . Elevated PSA    Dr Barnie Del  . Gilbert's syndrome   . History of heat stroke 2009  . History of nephrolithiasis   . Hyperlipidemia    NMR 06/2009: LDL 74(1638/453), HDL 40, TG 127. Framingham Study LDL goal =< 130   . Hypertension   . Osteoarthritis of left hip    hip replacement right and left   Past Surgical History:  Procedure Laterality Date  . CARDIAC CATHETERIZATION    . CARDIAC CATHETERIZATION  N/A 07/30/2015   Procedure: Left Heart Cath and Coronary Angiography;  Surgeon: Sherren Mocha, MD;  Location: [facility];  Service: Cardiovascular;  Laterality: N/A;  . CARDIAC CATHETERIZATION N/A 07/30/2015   Procedure: Coronary Stent Intervention;  Surgeon: Sherren Mocha, MD;  Location: [facility];  Service: Cardiovascular;  Laterality: N/A;  Distal RCA- Promus 3.50x16  . COLONOSCOPY  2011   negative  . INGUINAL HERNIA REPAIR Right 10/19/2017   Procedure: RIGHT  INGUINAL HERNIA REPAIR ERAS PATHWAY;  Surgeon: Erroll Luna, MD;  Location: [facility];  Service: General;  Laterality: Right;  Tap block  . INSERTION OF MESH Right 10/19/2017   Procedure: INSERTION OF MESH;  Surgeon: Erroll Luna, MD;  Location: [facility];  Service: General;  Laterality: Right;  . LITHOTRIPSY  2009   Dr.Duck[place], [place]   . TOTAL HIP ARTHROPLASTY  2008   left  . TOTAL HIP ARTHROPLASTY  2004   right   Family History  Problem Relation Age of Onset  . Diabetes Mother   . Hypertension Mother   . Heart attack Father 66       smoker  . Prostate cancer Brother 68       CAD; stents @ 58  . Hypertension Brother   . Heart attack Brother 58  . Coronary artery disease Brother        stents late 77s  . Stroke Neg Hx   . Colon cancer Neg Hx   . Colon polyps Neg Hx   . Esophageal cancer Neg Hx   . Rectal cancer Neg Hx   . Stomach cancer Neg Hx    Social History   Socioeconomic History  . Marital status: Married    Spouse name: Not on file  . Number of children: 3  . Years of education: Not on file  . Highest education level: Not on file  Occupational History  . Occupation: Chief Strategy Officer  Tobacco Use  . Smoking status: Former Smoker    Packs/day: 1.00    Years: 32.00    Pack years: 32.00    Types: Cigarettes    Start date: 80    Quit date: 01/26/2000    Years since quitting: 20.1  . Smokeless tobacco: Never Used  . Tobacco comment: Pt stopped a few times.  Vaping Use  . Vaping Use: Never used  Substance and Sexual Activity  . Alcohol use: Yes    Comment: 15 beers/week  . Drug use: No  . Sexual activity: Yes    Birth control/protection: Other-see comments  Other Topics Concern  . Not on file  Social History Narrative  . Not on file   Social Determinants of Health   Financial Resource Strain: Low Risk   . Difficulty of Paying Living Expenses: Not hard at all  Food Insecurity: No Food Insecurity  . Worried About Charity fundraiser in the Last Year: Never true  . Ran  Out of Food in the Last Year: Never true  Transportation Needs: No Transportation Needs  . Lack of Transportation (Medical): No  . Lack of Transportation (Non-Medical): No  Physical Activity: Sufficiently Active  . Days of Exercise per Week: 5 days  . Minutes of Exercise per Session: 30 min  Stress: No Stress Concern Present  . Feeling of Stress : Not at all  Social Connections: Socially Integrated  . Frequency of Communication with Friends and Family: More than three times a week  . Frequency of  Social Gatherings with Friends and Family: More than three times a week  . Attends Religious Services: More than 4 times per year  . Active Member of Clubs or Organizations: Yes  . Attends Archivist Meetings: More than 4 times per year  . Marital Status: Married    Tobacco Counseling Counseling given: Not Answered Comment: Pt stopped a few times.   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 no  Interpreter Needed?: No  Information entered by :: [employer], LPN.   Activities of Daily Living In your present state of health, do you have any difficulty performing the following activities: 03/26/2020 02/07/2020  Hearing? N N  Vision? N N  Difficulty concentrating or making decisions? N N  Walking or climbing stairs? N N  Dressing or bathing? N N  Doing errands, shopping? N N  Preparing Food and eating ? N -  Using the Toilet? N -  In the past six months, have you accidently leaked urine? N -  Do you have problems with loss of bowel control? N -   Managing your Medications? N -  Managing your Finances? N -  Housekeeping or managing your Housekeeping? N -  Some recent data might be hidden    Patient Care Team: Binnie Rail, MD as PCP - General (Internal Medicine) Sherren Mocha, MD as PCP - Cardiology (Cardiology)  Indicate any recent Medical Services you may have received from other than [facility] providers in the past year (date may be approximate).     Assessment:   This is a routine wellness examination for [place].  Hearing/Vision screen No exam data present  Dietary issues and exercise activities discussed: Current Exercise Habits: Home exercise routine, Type of exercise: walking;Other - see comments (swimming 3 times a week), Time (Minutes): 30, Frequency (Times/Week): 5, Weekly Exercise (Minutes/Week): 150, Intensity: Moderate  Goals    . Client understands the importance of follow-up with providers by attending scheduled visits     To maintain my current health status by continuing to eat healthy, stay physically active and socially active.    . Patient Stated     I would like to lose weight with a goal of 205 pounds. I will do this by continuing to swim, and monitor the amount of sugar and carbohydrates in my diet.     . Patient Stated     Lose weight to the goal of 200 Pounds. Once I get over my hernia surgery, I will begin swimming again and monitor my diet closely.      Depression Screen PHQ 2/9 Scores 03/26/2020 03/26/2020 02/07/2020 01/29/2019 01/11/2018 01/10/2017 01/10/2017  PHQ - 2 Score 0 0 0 0 1 0 0    Fall Risk Fall Risk  03/26/2020 02/07/2020 01/29/2019 01/11/2018 12/12/2017  Falls in the past year? 0 0 0 0 0  Comment - - - - Emmi Telephone Survey: data to providers prior to load  Number falls in past yr: 0 0 0 - -  Injury with Fall? 0 0 - - -  Risk  for fall due to : No Fall Risks No Fall Risks - - -  Follow up Falls evaluation completed Falls evaluation completed - - -    FALL RISK PREVENTION  PERTAINING TO THE HOME:  Any stairs in or around the home? Yes  If so, are there any without handrails? No  Home free of loose throw rugs in walkways, pet beds, electrical cords, etc? Yes  Adequate lighting in your home to reduce risk of falls? Yes   ASSISTIVE DEVICES UTILIZED TO PREVENT FALLS:  Life alert? No  Use of a cane, walker or w/c? No  Grab bars in the bathroom? No  Shower chair or bench in shower? No  Elevated toilet seat or a handicapped toilet? No   TIMED UP AND GO:  Was the test performed? No .  Length of time to ambulate 10 feet: 0 sec.   Gait steady and fast without use of assistive device  Cognitive Function: Normal cognitive status assessed by direct observation by this Nurse Health Advisor. No abnormalities found.         Immunizations Immunization History  Administered Date(s) Administered  . Influenza, High Dose Seasonal PF 10/25/2017, 11/18/2017  . Influenza,inj,Quad PF,6+ Mos 11/09/2018  . Influenza-Unspecified 10/26/2015, 11/08/2016  . PFIZER(Purple Top)SARS-COV-2 Vaccination 03/20/2019, 04/10/2019, 11/22/2019  . Pneumococcal Conjugate-13 01/05/2016  . Pneumococcal Polysaccharide-23 01/10/2017  . Tdap 05/29/2012  . Zoster 05/01/2012    TDAP status: Up to date  Flu Vaccine status: Up to date  Pneumococcal vaccine status: Up to date  Covid-19 vaccine status: Completed vaccines  Qualifies for Shingles Vaccine? Yes   Zostavax completed Yes   Shingrix Completed?: No.    Education has been provided regarding the importance of this vaccine. Patient has been advised to call insurance company to determine out of pocket expense if they have not yet received this vaccine. Advised may also receive vaccine at local pharmacy or Health Dept. Verbalized acceptance and understanding.  Screening Tests Health Maintenance  Topic Date Due  . COVID-19 Vaccine (4 - Booster for Pfizer series) 05/22/2020  . TETANUS/TDAP  05/30/2022  . COLONOSCOPY (Pts 45-4yrs  Insurance coverage will need to be confirmed)  03/13/2023  . INFLUENZA VACCINE  Completed  . Hepatitis C Screening  Completed  . PNA vac Low Risk Adult  Completed  . HPV VACCINES  Aged Out    Health Maintenance  There are no preventive care reminders to display for this patient.  Colorectal cancer screening: Type of screening: Colonoscopy. Completed 03/12/2020. Repeat every 3 years  Lung Cancer Screening: (Low Dose CT Chest recommended if Age 55-80 years, 30 pack-year currently smoking OR have quit w/in 15years.) does qualify.   Lung Cancer Screening Referral: no  Additional Screening:  Hepatitis C Screening: does qualify; Completed yes  Vision Screening: Recommended annual ophthalmology exams for early detection of glaucoma and other disorders of the eye. Is the patient up to date with their annual eye exam?  Yes  Who is the provider or what is the name of the office in which the patient attends annual eye exams? [facility].  If pt is not established with a provider, would they like to be referred to a provider to establish care? No .   Dental Screening: Recommended annual dental exams for proper oral hygiene  Community Resource Referral / Chronic Care Management: CRR required this visit?  No   CCM required this visit?  No      Plan:     I have personally reviewed  and noted the following in the patient's chart:   . Medical and social history . Use of alcohol, tobacco or illicit drugs  . Current medications and supplements . Functional ability and status . Nutritional status . Physical activity . Advanced directives . List of other physicians . Hospitalizations, surgeries, and ER visits in previous 12 months . Vitals . Screenings to include cognitive, depression, and falls . Referrals and appointments  In addition, I have reviewed and discussed with patient certain preventive protocols, quality metrics, and best practice recommendations. A written personalized care  plan for preventive services as well as general preventive health recommendations were provided to patient.     Sheral Flow, LPN   08/25/252   Nurse Notes:  Medications reviewed with patient; no opioid use noted.

## 2020-05-19 ENCOUNTER — Other Ambulatory Visit: Payer: Self-pay | Admitting: Internal Medicine

## 2020-05-20 DIAGNOSIS — L578 Other skin changes due to chronic exposure to nonionizing radiation: Secondary | ICD-10-CM | POA: Diagnosis not present

## 2020-05-20 DIAGNOSIS — L720 Epidermal cyst: Secondary | ICD-10-CM | POA: Diagnosis not present

## 2020-05-20 DIAGNOSIS — D1801 Hemangioma of skin and subcutaneous tissue: Secondary | ICD-10-CM | POA: Diagnosis not present

## 2020-05-20 DIAGNOSIS — Z85828 Personal history of other malignant neoplasm of skin: Secondary | ICD-10-CM | POA: Diagnosis not present

## 2020-06-02 ENCOUNTER — Other Ambulatory Visit: Payer: Self-pay | Admitting: Internal Medicine

## 2020-06-12 ENCOUNTER — Telehealth: Payer: Self-pay | Admitting: Internal Medicine

## 2020-06-12 NOTE — Progress Notes (Signed)
  Chronic Care Management   Note  06/12/2020 Name: Mike Ray MRN: [id number] DOB: [birth year]  Mike Ray is a 70 y.o. year old male who is a primary care patient of Burns, Claudina Lick, MD. I reached out to [name] by phone today in response to a referral sent by Mike Ray's PCP, Binnie Rail, MD.   [name] was given information about Chronic Care Management services today including:  1. CCM service includes personalized support from designated clinical staff supervised by his physician, including individualized plan of care and coordination with other care providers 2. 24/7 contact phone numbers for assistance for urgent and routine care needs. 3. Service will only be billed when office clinical staff spend 20 minutes or more in a month to coordinate care. 4. Only one practitioner may furnish and bill the service in a calendar month. 5. The patient may stop CCM services at any time (effective at the end of the month) by phone call to the office staff.   Patient agreed to services and verbal consent obtained.   Follow up plan:   [place]

## 2020-06-13 ENCOUNTER — Other Ambulatory Visit: Payer: Self-pay | Admitting: Internal Medicine

## 2020-06-15 ENCOUNTER — Encounter: Payer: Self-pay | Admitting: Internal Medicine

## 2020-06-18 ENCOUNTER — Encounter: Payer: Self-pay | Admitting: Internal Medicine

## 2020-07-08 NOTE — Progress Notes (Signed)
Chronic Care Management Pharmacy Note  07/09/2020 Name:  JACHIN COURY MRN:  [id number] DOB:  April 30, 1950  Summary: - Patient doing well, LDL well controlled, BP in office controlled - will plan for patient to check blood pressure at least once weekly to ensure BP remains at goal  Recommendations/Changes made from today's visit: No changes to medications at this time   Subjective: RENJI BERWICK is an 70 y.o. year old male who is a primary patient of Burns, Claudina Lick, MD.  The CCM team was consulted for assistance with disease management and care coordination needs.    Engaged with patient by telephone for initial visit in response to provider referral for pharmacy case management and/or care coordination services.   Consent to Services:  The patient was given the following information about Chronic Care Management services today, agreed to services, and gave verbal consent: 1. CCM service includes personalized support from designated clinical staff supervised by the primary care provider, including individualized plan of care and coordination with other care providers 2. 24/7 contact phone numbers for assistance for urgent and routine care needs. 3. Service will only be billed when office clinical staff spend 20 minutes or more in a month to coordinate care. 4. Only one practitioner may furnish and bill the service in a calendar month. 5.The patient may stop CCM services at any time (effective at the end of the month) by phone call to the office staff. 6. The patient will be responsible for cost sharing (co-pay) of up to 20% of the service fee (after annual deductible is met). Patient agreed to services and consent obtained.  Patient Care Team: Binnie Rail, MD as PCP - General (Internal Medicine) Sherren Mocha, MD as PCP - Cardiology (Cardiology) Tomasa Blase, [facility] as Pharmacist (Pharmacist)  Recent office visits: 02/07/2020 - PCP visit- no changes to medications, patient  stable   Recent consult visits: 03/02/2020 Gertie Fey - colonoscopy  11/20/2019 - Dr. Winifred Olive - Dermatology - scar conditions and fibrosis of skin   Hospital visits: None in previous 6 months  Objective:  Lab Results  Component Value Date   CREATININE 0.90 02/07/2020   BUN 13 02/07/2020   GFR 87.01 02/07/2020   GFRNONAA >60 10/17/2017   GFRAA >60 10/17/2017   NA 138 02/07/2020   K 4.3 02/07/2020   CALCIUM 9.7 02/07/2020   CO2 27 02/07/2020   GLUCOSE 121 (H) 02/07/2020    Lab Results  Component Value Date/Time   HGBA1C 5.9 02/07/2020 08:17 AM   HGBA1C 5.7 (H) 08/06/2019 08:48 AM   GFR 87.01 02/07/2020 08:17 AM   GFR 85.94 01/29/2019 09:15 AM    Last diabetic Eye exam:  No results found for: HMDIABEYEEXA  Last diabetic Foot exam:  No results found for: HMDIABFOOTEX   Lab Results  Component Value Date   CHOL 90 02/07/2020   HDL 33.60 (L) 02/07/2020   LDLCALC 33 02/07/2020   LDLDIRECT 117.0 07/10/2014   TRIG 117.0 02/07/2020   CHOLHDL 3 02/07/2020    Hepatic Function Latest Ref Rng & Units 02/07/2020 08/06/2019 01/29/2019  Total Protein 6.0 - 8.3 g/dL 7.3 6.7 7.4  Albumin 3.5 - 5.2 g/dL 4.9 - 4.8  AST 0 - 37 U/L 32 21 28  ALT 0 - 53 U/L 38 28 35  Alk Phosphatase 39 - 117 U/L 56 - 69  Total Bilirubin 0.2 - 1.2 mg/dL 1.3(H) 0.7 1.4(H)  Bilirubin, Direct <=0.2 mg/dL - - -    Lab  Results  Component Value Date/Time   TSH 1.41 08/06/2019 08:48 AM   TSH 1.43 07/24/2018 09:50 AM    CBC Latest Ref Rng & Units 08/06/2019 07/24/2018 10/17/2017  WBC 3.8 - 10.8 Thousand/uL 5.7 5.9 5.2  Hemoglobin 13.2 - 17.1 g/dL 15.4 15.5 15.2  Hematocrit 38.5 - 50.0 % 45.2 44.9 43.7  Platelets 140 - 400 Thousand/uL 260 226.0 193    No results found for: VD25OH  Clinical ASCVD: Yes  The ASCVD Risk score Mikey Bussing DC Jr., et al., 2013) failed to calculate for the following reasons:   The patient has a prior MI or stroke diagnosis    Depression screen [facility] 2/9 03/26/2020 03/26/2020 02/07/2020   Decreased Interest 0 0 0  Down, Depressed, Hopeless 0 0 0  PHQ - 2 Score 0 0 0     Social History   Tobacco Use  Smoking Status Former   Packs/day: 1.00   Years: 32.00   Pack years: 32.00   Types: Cigarettes   Start date: 8   Quit date: 01/26/2000   Years since quitting: 20.4  Smokeless Tobacco Never  Tobacco Comments   Pt stopped a few times.   BP Readings from Last 3 Encounters:  03/26/20 130/80  03/12/20 105/70  02/07/20 122/74   Pulse Readings from Last 3 Encounters:  03/26/20 71  03/12/20 72  02/07/20 78   Wt Readings from Last 3 Encounters:  03/26/20 224 lb 6.4 oz (101.8 kg)  03/12/20 222 lb (100.7 kg)  02/25/20 222 lb (100.7 kg)   BMI Readings from Last 3 Encounters:  03/26/20 31.30 kg/m  03/12/20 30.96 kg/m  02/25/20 30.96 kg/m    Assessment/Interventions: Review of patient past medical history, allergies, medications, health status, including review of consultants reports, laboratory and other test data, was performed as part of comprehensive evaluation and provision of chronic care management services.   SDOH:  (Social Determinants of Health) assessments and interventions performed: Yes  SDOH Screenings   Alcohol Screen: Low Risk    Last Alcohol Screening Score (AUDIT): 3  Depression (PHQ2-9): Low Risk    PHQ-2 Score: 0  Financial Resource Strain: Low Risk    Difficulty of Paying Living Expenses: Not hard at all  Food Insecurity: No Food Insecurity   Worried About Charity fundraiser in the Last Year: Never true   Ran Out of Food in the Last Year: Never true  Housing: Low Risk    Last Housing Risk Score: 0  Physical Activity: Sufficiently Active   Days of Exercise per Week: 5 days   Minutes of Exercise per Session: 30 min  Social Connections: Engineer, building services of Communication with Friends and Family: More than three times a week   Frequency of Social Gatherings with Friends and Family: More than three times a week    Attends Religious Services: More than 4 times per year   Active Member of [employer] or Organizations: Yes   Attends Music therapist: More than 4 times per year   Marital Status: Married  Stress: No Stress Concern Present   Feeling of Stress : Not at all  Tobacco Use: Medium Risk   Smoking Tobacco Use: Former   Smokeless Tobacco Use: Never  Transportation Needs: No Data processing manager (Medical): No   Lack of Transportation (Non-Medical): No    CCM Care Plan  No Known Allergies  Medications Reviewed Today     Reviewed by Tomasa Blase, [facility] (Pharmacist)  on 07/09/20 at 1145  Med List Status: <None>   Medication Order Taking? Sig Documenting Provider Last Dose Status Informant  acetaminophen (TYLENOL) 500 MG tablet [id number] No Take 1,000 mg by mouth daily with breakfast.  Patient not taking: Reported on 07/09/2020   [provider] Not Taking Active   amLODipine (NORVASC) 10 MG tablet [id number] Yes Take 1 tablet by mouth once daily Binnie Rail, MD Taking Active   aspirin 81 MG tablet [id number] Yes Take 81 mg by mouth daily. [provider] Taking Active   atorvastatin (LIPITOR) 80 MG tablet [id number] Yes TAKE 1 TABLET BY MOUTH ONCE DAILY 6 IN THE EVENING Burns, Claudina Lick, MD Taking Active   calcium carbonate (TUMS - DOSED IN MG ELEMENTAL CALCIUM) 500 MG chewable tablet [id number] Yes Chew 1 tablet by mouth daily as needed. [provider] Taking Active   Coenzyme Q10 (COQ10) 100 MG CAPS [id number] Yes Take 1 capsule by mouth daily. [provider] Taking Active   famotidine (PEPCID) 20 MG tablet [id number] Yes Take 20 mg by mouth daily as needed for heartburn or indigestion. [provider] Taking Active   Flaxseed, Linseed, (FLAXSEED OIL) 1000 MG CAPS [id number] Yes Take 1 capsule by mouth every other day. [provider] Taking Active   ibuprofen (ADVIL) 200 MG tablet [id number] Yes Take 600 mg by  mouth daily as needed. Takes 3-4 times weekly [provider] Taking Active   losartan (COZAAR) 100 MG tablet [id number] Yes Take 1 tablet (100 mg total) by mouth daily. Sherren Mocha, MD Taking Active   metoprolol tartrate (LOPRESSOR) 25 MG tablet [id number] Yes Take 1/2 (one-half) tablet by mouth twice daily Burns, Claudina Lick, MD Taking Active   Multiple Vitamin (MULTIVITAMIN) tablet [id number] Yes Take 1 tablet by mouth every other day.  [provider] Taking Active Self  mupirocin ointment (BACTROBAN) 2 % [id number] No Place 1 application into the nose 2 (two) times daily.  Patient not taking: No sig reported   Binnie Rail, MD Not Taking Active   nitroGLYCERIN (NITROSTAT) 0.4 MG SL tablet [id number] No Place 1 tablet (0.4 mg total) under the tongue every 5 (five) minutes x 3 doses as needed for chest pain.  Patient not taking: No sig reported   Sherren Mocha, MD Not Taking Active             Patient Active Problem List   Diagnosis Date Noted   Skin cancer 07/24/2018   Non-recurrent unilateral inguinal hernia without obstruction or gangrene 09/12/2017   Prediabetes 07/04/2016   CAD (coronary artery disease) 01/04/2016   History of ST elevation myocardial infarction (STEMI) 01/04/2016   NEPHROLITHIASIS, HX OF 10/22/2008   CHOLELITHIASIS 11/08/2007   Essential hypertension 10/17/2007   HYPERLIPIDEMIA 12/13/2006   ELEVATED PROSTATE SPECIFIC ANTIGEN 09/16/2006    Immunization History  Administered Date(s) Administered   Influenza, High Dose Seasonal PF 10/25/2017, 11/18/2017   Influenza,inj,Quad PF,6+ Mos 11/09/2018   Influenza-Unspecified 10/26/2015, 11/08/2016   PFIZER(Purple Top)SARS-COV-2 Vaccination 03/20/2019, 04/10/2019, 11/22/2019   Pneumococcal Conjugate-13 01/05/2016   Pneumococcal Polysaccharide-23 01/10/2017   Tdap 05/29/2012   Zoster, Live 05/01/2012    Conditions to be addressed/monitored:  Hypertension, Hyperlipidemia, and Coronary Artery  Disease  Care Plan : CCM Care Plan  Updates made by Tomasa Blase, RPH since 07/09/2020 12:00 AM     Problem: HTN, HLD, CAD   Priority: High  Onset Date: 07/09/2020     Long-Range Goal: Disease Management   Start Date: 07/09/2020  Expected End Date: 01/08/2021  This Visit's Progress: On track  Priority: High  Note:   Current Barriers:  Unable to independently monitor therapeutic efficacy  Pharmacist Clinical Goal(s):  Patient will achieve adherence to monitoring guidelines and medication adherence to achieve therapeutic efficacy maintain control of Blood pressure and LDL as evidenced by blood pressure log and next lipid panel  through collaboration with PharmD and provider.   Interventions: 1:1 collaboration with Binnie Rail, MD regarding development and update of comprehensive plan of care as evidenced by provider attestation and co-signature Inter-disciplinary care team collaboration (see longitudinal plan of care) Comprehensive medication review performed; medication list updated in electronic medical record  Hypertension (BP goal <130/80) -Not ideally controlled -Current treatment: Amlodipine 10mg  daily  Losartan 100mg  daily  Metoprolol tartrate - 1/2 tablet twice daily  -Medications previously tried: benazepril, lisinopril   -Current home readings: checks only when he feels "off" - can be slightly elevated at times - last in office readings - 122/76 pulse 76 138/84 - pulse of 94 -Current dietary habits: reports that he has cut back salt intake over the last 10 years , lightly salts foods at time  -Current exercise habits: walking 2-3 miles daily - swims twice weekly  -Reports hypotensive/hypertensive symptoms - reports dizziness when out in the heat 2-3 times daily  -Educated on BP goals and benefits of medications for prevention of heart attack, stroke and kidney damage; Daily salt intake goal < 2300 mg; Exercise goal of 150 minutes per week; Importance of home  blood pressure monitoring; Proper BP monitoring technique; Symptoms of hypotension and importance of maintaining adequate hydration; -Counseled to monitor BP at home at least once weekly, document, and provide log at future appointments -Counseled on diet and exercise extensively Recommended to continue current medication  Hyperlipidemia / Coronary Artery Disease / Previous STEMI: (LDL goal < 70) -Controlled - Last LDL 33 (02/07/2020) -Current treatment: Atorvastatin 80mg  daily  Aspirin 81mg  daily  Co-Q10 - 100mg  daily  Nitroglycerin 0.4mg  - 1 tablet every 5 minutes for 3 doses as needed for chest pain -Medications previously tried: pravastatin, rosuvastatin , clopidogrel, ticagrelor  -Current dietary patterns: reports that he has cut back on amount of red meat he was eating -eats chicken for about 8/10 meals -Current exercise habits: walking daily - swims twice weekly  -Educated on Cholesterol goals;  Benefits of statin for ASCVD risk reduction; Importance of limiting foods high in cholesterol; Exercise goal of 150 minutes per week; Strategies to manage statin-induced myalgias; -Counseled on diet and exercise extensively Recommended to continue current medication  Health Maintenance -Vaccine gaps: Shingles Vaccines, COVID booster (4th shot) -Current therapy:  Ibuprofen - 600mg  daily if needed  Tums 500mg  - 1-2 tablets daily if needed  Famotidine 20mg  - 1 tablet daily if needed  Flaxseed, Linseed Oil - 1000mg  every other day  Multivitamin - 1 tablet daily  Mupirocin 2% ointment - 1 application twice daily  if needed  -Educated on Herbal supplement research is limited and benefits usually cannot be proven Cost vs benefit of each product must be carefully weighed by individual consumer -Patient is satisfied with current therapy and denies issues -Recommended to continue current medication  Patient Goals/Self-Care Activities Patient will:  - take medications as prescribed check  blood pressure at least once weekly, document, and provide at future appointments target a minimum of 150 minutes of moderate intensity exercise weekly engage in dietary modifications by reducing sodium intake, continuing to moderate red meat/ fried and fatty  food intake   Follow Up Plan: Telephone follow up appointment with care management team member scheduled for: 3 months The patient has been provided with contact information for the care management team and has been advised to call with any health related questions or concerns.         Medication Assistance: None required.  Patient affirms current coverage meets needs.  Patient's preferred pharmacy is:  [place], [place] - [address] [place] [address] Phone: [phone] Fax: [phone]  [id number] - [place], [address] [place] [address] Phone: [phone] Fax: [phone]   Uses pill box? No - able to manage without  Pt endorses 100% compliance  Care Plan and Follow Up Patient Decision:  Patient agrees to Care Plan and Follow-up.  Plan: Telephone follow up appointment with care management team member scheduled for:  3 months  and The patient has been provided with contact information for the care management team and has been advised to call with any health related questions or concerns.   Tomasa Blase, PharmD Clinical Pharmacist, [place]  07/10/20 4:39 PM

## 2020-07-09 ENCOUNTER — Ambulatory Visit (INDEPENDENT_AMBULATORY_CARE_PROVIDER_SITE_OTHER): Payer: Medicare Other

## 2020-07-09 ENCOUNTER — Other Ambulatory Visit: Payer: Self-pay

## 2020-07-09 DIAGNOSIS — I2510 Atherosclerotic heart disease of native coronary artery without angina pectoris: Secondary | ICD-10-CM | POA: Diagnosis not present

## 2020-07-09 DIAGNOSIS — I252 Old myocardial infarction: Secondary | ICD-10-CM

## 2020-07-09 DIAGNOSIS — E782 Mixed hyperlipidemia: Secondary | ICD-10-CM | POA: Diagnosis not present

## 2020-07-09 DIAGNOSIS — I10 Essential (primary) hypertension: Secondary | ICD-10-CM | POA: Diagnosis not present

## 2020-07-09 NOTE — Patient Instructions (Signed)
Visit Information   PATIENT GOALS:   Goals Addressed             This Visit's Progress    Track and Manage My Blood Pressure-Hypertension       Timeframe:  Long-Range Goal Priority:  High Start Date:   07/09/2020                         Expected End Date: 01/07/2021                      Follow Up Date 10/08/2020   - check blood pressure weekly - choose a place to take my blood pressure (home, clinic or office, retail store) - write blood pressure results in a log or diary    Why is this important?   You won't feel high blood pressure, but it can still hurt your blood vessels.  High blood pressure can cause heart or kidney problems. It can also cause a stroke.  Making lifestyle changes like losing a little weight or eating less salt will help.  Checking your blood pressure at home and at different times of the day can help to control blood pressure.  If the doctor prescribes medicine remember to take it the way the doctor ordered.  Call the office if you cannot afford the medicine or if there are questions about it.     Notes: Patient to reach out to office should BP be elevated from goal (>130/80)         Consent to CCM Services: Mike Ray was given information about Chronic Care Management services today including:  CCM service includes personalized support from designated clinical staff supervised by his physician, including individualized plan of care and coordination with other care providers 24/7 contact phone numbers for assistance for urgent and routine care needs. Service will only be billed when office clinical staff spend 20 minutes or more in a month to coordinate care. Only one practitioner may furnish and bill the service in a calendar month. The patient may stop CCM services at any time (effective at the end of the month) by phone call to the office staff. The patient will be responsible for cost sharing (co-pay) of up to 20% of the service fee (after annual  deductible is met).  Patient agreed to services and verbal consent obtained.   Patient verbalizes understanding of instructions provided today and agrees to view in MyChart.   Telephone follow up appointment with care management team member scheduled for: 3 months  The patient has been provided with contact information for the care management team and has been advised to call with any health related questions or concerns.    C , PharmD Clinical Pharmacist, [place]      CLINICAL CARE PLAN: Patient Care Plan: CCM Care Plan     Problem Identified: HTN, HLD, CAD   Priority: High  Onset Date: 07/09/2020     Long-Range Goal: Disease Management   Start Date: 07/09/2020  Expected End Date: 01/08/2021  This Visit's Progress: On track  Priority: High  Note:   Current Barriers:  Unable to independently monitor therapeutic efficacy  Pharmacist Clinical Goal(s):  Patient will achieve adherence to monitoring guidelines and medication adherence to achieve therapeutic efficacy maintain control of Blood pressure and LDL as evidenced by blood pressure log and next lipid panel  through collaboration with PharmD and provider.   Interventions: 1:1 collaboration with Burns, Stacy   J, MD regarding development and update of comprehensive plan of care as evidenced by provider attestation and co-signature Inter-disciplinary care team collaboration (see longitudinal plan of care) Comprehensive medication review performed; medication list updated in electronic medical record  Hypertension (BP goal <130/80) -Not ideally controlled -Current treatment: Amlodipine 10mg daily  Losartan 100mg daily  Metoprolol tartrate - 1/2 tablet twice daily  -Medications previously tried: benazepril, lisinopril   -Current home readings: checks only when he feels "off" - can be slightly elevated at times - last in office readings - 122/76 pulse 76 138/84 - pulse of 94 -Current dietary habits:  reports that he has cut back salt intake over the last 10 years , lightly salts foods at time  -Current exercise habits: walking 2-3 miles daily - swims twice weekly  -Reports hypotensive/hypertensive symptoms - reports dizziness when out in the heat 2-3 times daily  -Educated on BP goals and benefits of medications for prevention of heart attack, stroke and kidney damage; Daily salt intake goal < 2300 mg; Exercise goal of 150 minutes per week; Importance of home blood pressure monitoring; Proper BP monitoring technique; Symptoms of hypotension and importance of maintaining adequate hydration; -Counseled to monitor BP at home at least once weekly, document, and provide log at future appointments -Counseled on diet and exercise extensively Recommended to continue current medication  Hyperlipidemia / Coronary Artery Disease / Previous STEMI: (LDL goal < 70) -Controlled - Last LDL 33 (02/07/2020) -Current treatment: Atorvastatin 80mg daily  Aspirin 81mg daily  Co-Q10 - 100mg daily  Nitroglycerin 0.4mg - 1 tablet every 5 minutes for 3 doses as needed for chest pain -Medications previously tried: pravastatin, rosuvastatin , clopidogrel, ticagrelor  -Current dietary patterns: reports that he has cut back on amount of red meat he was eating -eats chicken for about 8/10 meals -Current exercise habits: walking daily - swims twice weekly  -Educated on Cholesterol goals;  Benefits of statin for ASCVD risk reduction; Importance of limiting foods high in cholesterol; Exercise goal of 150 minutes per week; Strategies to manage statin-induced myalgias; -Counseled on diet and exercise extensively Recommended to continue current medication  Health Maintenance -Vaccine gaps: Shingles Vaccines, COVID booster (4th shot) -Current therapy:  Ibuprofen - 600mg daily if needed  Tums 500mg - 1-2 tablets daily if needed  Famotidine 20mg - 1 tablet daily if needed  Flaxseed, Linseed Oil - 1000mg every other  day  Multivitamin - 1 tablet daily  Mupirocin 2% ointment - 1 application twice daily  if needed  -Educated on Herbal supplement research is limited and benefits usually cannot be proven Cost vs benefit of each product must be carefully weighed by individual consumer -Patient is satisfied with current therapy and denies issues -Recommended to continue current medication  Patient Goals/Self-Care Activities Patient will:  - take medications as prescribed check blood pressure at least once weekly, document, and provide at future appointments target a minimum of 150 minutes of moderate intensity exercise weekly engage in dietary modifications by reducing sodium intake, continuing to moderate red meat/ fried and fatty food intake   Follow Up Plan: Telephone follow up appointment with care management team member scheduled for: The patient has been provided with contact information for the care management team and has been advised to call with any health related questions or concerns.       

## 2020-08-05 NOTE — Progress Notes (Signed)
Subjective:    Patient ID: Mike Ray, male    DOB: 1950-02-13, 70 y.o.   MRN: [id number]  HPI The patient is here for follow up of their chronic medical problems, including CAD, htn, hld, prediabetes      Medications and allergies reviewed with patient and updated if appropriate.  Patient Active Problem List   Diagnosis Date Noted   Skin cancer 07/24/2018   Non-recurrent unilateral inguinal hernia without obstruction or gangrene 09/12/2017   Prediabetes 07/04/2016   CAD (coronary artery disease) 01/04/2016   History of ST elevation myocardial infarction (STEMI) 01/04/2016   NEPHROLITHIASIS, HX OF 10/22/2008   CHOLELITHIASIS 11/08/2007   Essential hypertension 10/17/2007   HYPERLIPIDEMIA 12/13/2006   ELEVATED PROSTATE SPECIFIC ANTIGEN 09/16/2006    Current Outpatient Medications on File Prior to Visit  Medication Sig Dispense Refill   amLODipine (NORVASC) 10 MG tablet Take 1 tablet by mouth once daily 90 tablet 0   aspirin 81 MG tablet Take 81 mg by mouth daily.     atorvastatin (LIPITOR) 80 MG tablet TAKE 1 TABLET BY MOUTH ONCE DAILY 6 IN THE EVENING 90 tablet 2   calcium carbonate (TUMS - DOSED IN MG ELEMENTAL CALCIUM) 500 MG chewable tablet Chew 1 tablet by mouth daily as needed.     Coenzyme Q10 (COQ10) 100 MG CAPS Take 1 capsule by mouth daily.     famotidine (PEPCID) 20 MG tablet Take 20 mg by mouth daily as needed for heartburn or indigestion.     ibuprofen (ADVIL) 200 MG tablet Take 600 mg by mouth daily as needed. Takes 3-4 times weekly     losartan (COZAAR) 100 MG tablet Take 1 tablet (100 mg total) by mouth daily. 90 tablet 2   metoprolol tartrate (LOPRESSOR) 25 MG tablet Take 1/2 (one-half) tablet by mouth twice daily 90 tablet 0   Multiple Vitamin (MULTIVITAMIN) tablet Take 1 tablet by mouth every other day.      nitroGLYCERIN (NITROSTAT) 0.4 MG SL tablet Place 1 tablet (0.4 mg total) under the tongue every 5 (five) minutes x 3 doses as needed for chest  pain. 25 tablet 5   No current facility-administered medications on file prior to visit.    Past Medical History:  Diagnosis Date   Acute MI, inferolateral wall, initial episode of care ([place]) 07/30/2015   Coronary artery disease    Elevated PSA    Dr Barnie Del   Gilbert's syndrome    History of heat stroke 2009   History of nephrolithiasis    Hyperlipidemia    NMR 06/2009: LDL 88(1345/912), HDL 40, TG 127. Framingham Study LDL goal =< 130    Hypertension    Osteoarthritis of left hip    hip replacement right and left    Past Surgical History:  Procedure Laterality Date   CARDIAC CATHETERIZATION     CARDIAC CATHETERIZATION N/A 07/30/2015   Procedure: Left Heart Cath and Coronary Angiography;  Surgeon: Sherren Mocha, MD;  Location: [facility];  Service: Cardiovascular;  Laterality: N/A;   CARDIAC CATHETERIZATION N/A 07/30/2015   Procedure: Coronary Stent Intervention;  Surgeon: Sherren Mocha, MD;  Location: [facility];  Service: Cardiovascular;  Laterality: N/A;  Distal RCA- Promus 3.50x16   COLONOSCOPY  2011   negative   INGUINAL HERNIA REPAIR Right 10/19/2017   Procedure: RIGHT  INGUINAL HERNIA REPAIR ERAS PATHWAY;  Surgeon: Erroll Luna, MD;  Location: [facility];  Service: General;  Laterality: Right;  Tap block  INSERTION OF MESH Right 10/19/2017   Procedure: INSERTION OF MESH;  Surgeon: Erroll Luna, MD;  Location: [facility];  Service: General;  Laterality: Right;   LITHOTRIPSY  2009   Dr.Duckett, [place]    TOTAL HIP ARTHROPLASTY  2008   left   TOTAL HIP ARTHROPLASTY  2004   right    Social History   Socioeconomic History   Marital status: Married    Spouse name: Not on file   Number of children: 3   Years of education: Not on file   Highest education level: Not on file  Occupational History   Occupation: Chief Strategy Officer  Tobacco Use   Smoking status: Former    Packs/day: 1.00    Years: 32.00    Pack years: 32.00     Types: Cigarettes    Start date: 1970    Quit date: 01/26/2000    Years since quitting: 20.5   Smokeless tobacco: Never   Tobacco comments:    Pt stopped a few times.  Vaping Use   Vaping Use: Never used  Substance and Sexual Activity   Alcohol use: Yes    Comment: 15 beers/week   Drug use: No   Sexual activity: Yes    Birth control/protection: Other-see comments  Other Topics Concern   Not on file  Social History Narrative   Not on file   Social Determinants of Health   Financial Resource Strain: Low Risk    Difficulty of Paying Living Expenses: Not hard at all  Food Insecurity: No Food Insecurity   Worried About Charity fundraiser in the Last Year: Never true   [place] in the Last Year: Never true  Transportation Needs: No Transportation Needs   Lack of Transportation (Medical): No   Lack of Transportation (Non-Medical): No  Physical Activity: Sufficiently Active   Days of Exercise per Week: 5 days   Minutes of Exercise per Session: 30 min  Stress: No Stress Concern Present   Feeling of Stress : Not at all  Social Connections: Socially Integrated   Frequency of Communication with Friends and Family: More than three times a week   Frequency of Social Gatherings with Friends and Family: More than three times a week   Attends Religious Services: More than 4 times per year   Active Member of [employer] or Organizations: Yes   Attends Music therapist: More than 4 times per year   Marital Status: Married    Family History  Problem Relation Age of Onset   Diabetes Mother    Hypertension Mother    Heart attack Father 4       smoker   Prostate cancer Brother 70       CAD; stents @ 56   Hypertension Brother    Heart attack Brother 39   Coronary artery disease Brother        stents late 52s   Stroke Neg Hx    Colon cancer Neg Hx    Colon polyps Neg Hx    Esophageal cancer Neg Hx    Rectal cancer Neg Hx    Stomach cancer Neg Hx     Review of  Systems  Constitutional:  Negative for chills and fever.  Respiratory:  Negative for cough, shortness of breath and wheezing.   Cardiovascular:  Negative for chest pain, palpitations and leg swelling.  Neurological:  Negative for dizziness, light-headedness and headaches.      Objective:   Vitals:  08/06/20 0749  BP: 120/80  Pulse: 68  Temp: 98.3 F (36.8 C)  SpO2: 98%   BP Readings from Last 3 Encounters:  08/06/20 120/80  03/26/20 130/80  03/12/20 105/70   Wt Readings from Last 3 Encounters:  08/06/20 220 lb (99.8 kg)  03/26/20 224 lb 6.4 oz (101.8 kg)  03/12/20 222 lb (100.7 kg)   Body mass index is 30.68 kg/m.   Physical Exam    Constitutional: Appears well-developed and well-nourished. No distress.  HENT:  Head: Normocephalic and atraumatic.  Neck: Neck supple. No tracheal deviation present. No thyromegaly present.  No cervical lymphadenopathy Cardiovascular: Normal rate, regular rhythm and normal heart sounds.   No murmur heard. No carotid bruit .  No edema Pulmonary/Chest: Effort normal and breath sounds normal. No respiratory distress. No has no wheezes. No rales.  Skin: Skin is warm and dry. Not diaphoretic.  Psychiatric: Normal mood and affect. Behavior is normal.      Assessment & Plan:    See Problem List for Assessment and Plan of chronic medical problems.    This visit occurred during the SARS-CoV-2 public health emergency.  Safety protocols were in place, including screening questions prior to the visit, additional usage of staff PPE, and extensive cleaning of exam room while observing appropriate contact time as indicated for disinfecting solutions.

## 2020-08-05 NOTE — Patient Instructions (Addendum)
    Blood work was ordered.      Medications changes include :   none     Please followup in 6 months  

## 2020-08-06 ENCOUNTER — Other Ambulatory Visit: Payer: Self-pay

## 2020-08-06 ENCOUNTER — Ambulatory Visit (INDEPENDENT_AMBULATORY_CARE_PROVIDER_SITE_OTHER): Payer: Medicare Other | Admitting: Internal Medicine

## 2020-08-06 ENCOUNTER — Encounter: Payer: Self-pay | Admitting: Internal Medicine

## 2020-08-06 VITALS — BP 120/80 | HR 68 | Temp 98.3°F | Ht 71.0 in | Wt 220.0 lb

## 2020-08-06 DIAGNOSIS — E782 Mixed hyperlipidemia: Secondary | ICD-10-CM

## 2020-08-06 DIAGNOSIS — I10 Essential (primary) hypertension: Secondary | ICD-10-CM | POA: Diagnosis not present

## 2020-08-06 DIAGNOSIS — R7303 Prediabetes: Secondary | ICD-10-CM | POA: Diagnosis not present

## 2020-08-06 DIAGNOSIS — I2510 Atherosclerotic heart disease of native coronary artery without angina pectoris: Secondary | ICD-10-CM | POA: Diagnosis not present

## 2020-08-06 LAB — CBC WITH DIFFERENTIAL/PLATELET
Basophils Absolute: 0 10*3/uL (ref 0.0–0.1)
Basophils Relative: 0.8 % (ref 0.0–3.0)
Eosinophils Absolute: 0.1 10*3/uL (ref 0.0–0.7)
Eosinophils Relative: 2.3 % (ref 0.0–5.0)
HCT: 43.7 % (ref 39.0–52.0)
Hemoglobin: 15.1 g/dL (ref 13.0–17.0)
Lymphocytes Relative: 29.5 % (ref 12.0–46.0)
Lymphs Abs: 1.8 10*3/uL (ref 0.7–4.0)
MCHC: 34.6 g/dL (ref 30.0–36.0)
MCV: 93.8 fl (ref 78.0–100.0)
Monocytes Absolute: 0.5 10*3/uL (ref 0.1–1.0)
Monocytes Relative: 8.4 % (ref 3.0–12.0)
Neutro Abs: 3.5 10*3/uL (ref 1.4–7.7)
Neutrophils Relative %: 59 % (ref 43.0–77.0)
Platelets: 242 10*3/uL (ref 150.0–400.0)
RBC: 4.66 Mil/uL (ref 4.22–5.81)
RDW: 13.3 % (ref 11.5–15.5)
WBC: 5.9 10*3/uL (ref 4.0–10.5)

## 2020-08-06 LAB — COMPREHENSIVE METABOLIC PANEL
ALT: 37 U/L (ref 0–53)
AST: 29 U/L (ref 0–37)
Albumin: 4.8 g/dL (ref 3.5–5.2)
Alkaline Phosphatase: 54 U/L (ref 39–117)
BUN: 16 mg/dL (ref 6–23)
CO2: 26 mEq/L (ref 19–32)
Calcium: 9.8 mg/dL (ref 8.4–10.5)
Chloride: 104 mEq/L (ref 96–112)
Creatinine, Ser: 0.93 mg/dL (ref 0.40–1.50)
GFR: 83.36 mL/min (ref >60.00)
Glucose, Bld: 109 mg/dL — ABNORMAL HIGH (ref 70–99)
Potassium: 4.2 mEq/L (ref 3.5–5.1)
Sodium: 139 mEq/L (ref 135–145)
Total Bilirubin: 1.4 mg/dL — ABNORMAL HIGH (ref 0.2–1.2)
Total Protein: 7.4 g/dL (ref 6.0–8.3)

## 2020-08-06 LAB — LIPID PANEL
Cholesterol: 104 mg/dL (ref 0–200)
HDL: 32.4 mg/dL — ABNORMAL LOW (ref >39.00)
LDL Cholesterol: 40 mg/dL (ref 0–99)
NonHDL: 72.03
Total CHOL/HDL Ratio: 3
Triglycerides: 159 mg/dL — ABNORMAL HIGH (ref 0.0–149.0)
VLDL: 31.8 mg/dL (ref 0.0–40.0)

## 2020-08-06 LAB — HEMOGLOBIN A1C: Hgb A1c MFr Bld: 6.1 % (ref 4.6–6.5)

## 2020-08-06 NOTE — Addendum Note (Signed)
Addended by: Boris Lown B on: 08/06/2020 08:18 AM   Modules accepted: Orders

## 2020-08-06 NOTE — Assessment & Plan Note (Signed)
Chronic no symptoms suggestive of angina Continue aspirin 81 mg daily, atorvastatin 80 mg daily, metoprolol, losartan CBC, CMP, lipids

## 2020-08-06 NOTE — Assessment & Plan Note (Signed)
Chronic Check lipid panel  Continue atorvastatin 80 mg daily Regular exercise and healthy diet encouraged  

## 2020-08-06 NOTE — Assessment & Plan Note (Signed)
Chronic BP well controlled Continue amlodipine 10 mg daily, losartan 100 mg daily, metoprolol 12.5 mg twice daily cmp

## 2020-08-06 NOTE — Assessment & Plan Note (Signed)
Chronic Check a1c Low sugar / carb diet Stressed regular exercise  

## 2020-08-11 DIAGNOSIS — Z23 Encounter for immunization: Secondary | ICD-10-CM | POA: Diagnosis not present

## 2020-08-18 ENCOUNTER — Other Ambulatory Visit: Payer: Self-pay | Admitting: Internal Medicine

## 2020-08-27 DIAGNOSIS — M25511 Pain in right shoulder: Secondary | ICD-10-CM | POA: Diagnosis not present

## 2020-08-31 ENCOUNTER — Other Ambulatory Visit: Payer: Self-pay | Admitting: Internal Medicine

## 2020-10-06 ENCOUNTER — Encounter: Payer: Self-pay | Admitting: Internal Medicine

## 2020-10-08 ENCOUNTER — Telehealth: Payer: Medicare Other

## 2020-11-02 ENCOUNTER — Other Ambulatory Visit: Payer: Self-pay | Admitting: Cardiovascular Disease

## 2020-11-19 ENCOUNTER — Other Ambulatory Visit: Payer: Self-pay | Admitting: Internal Medicine

## 2020-12-02 ENCOUNTER — Other Ambulatory Visit: Payer: Self-pay | Admitting: Internal Medicine

## 2020-12-03 DIAGNOSIS — Z23 Encounter for immunization: Secondary | ICD-10-CM | POA: Diagnosis not present

## 2020-12-16 DIAGNOSIS — H2513 Age-related nuclear cataract, bilateral: Secondary | ICD-10-CM | POA: Diagnosis not present

## 2021-02-08 ENCOUNTER — Encounter: Payer: Self-pay | Admitting: Internal Medicine

## 2021-02-08 NOTE — Patient Instructions (Addendum)
° ° ° ° °  Blood work was ordered.      Medications changes include :   None    Please followup in 6 months

## 2021-02-08 NOTE — Progress Notes (Signed)
Subjective:    Patient ID: Mike Ray, male    DOB: 04/07/1950, 71 y.o.   MRN: [id number]  This visit occurred during the SARS-CoV-2 public health emergency.  Safety protocols were in place, including screening questions prior to the visit, additional usage of staff PPE, and extensive cleaning of exam room while observing appropriate contact time as indicated for disinfecting solutions.     HPI The patient is here for follow up of their chronic medical problems, including CAD, htn, hld, prediabetes  He is swimming for exercise - 2/week.   He is compliant with low sugar/salt diet  Medications and allergies reviewed with patient and updated if appropriate.  Patient Active Problem List   Diagnosis Date Noted   Skin cancer 07/24/2018   Non-recurrent unilateral inguinal hernia without obstruction or gangrene 09/12/2017   Prediabetes 07/04/2016   CAD (coronary artery disease) 01/04/2016   History of ST elevation myocardial infarction (STEMI) 01/04/2016   NEPHROLITHIASIS, HX OF 10/22/2008   CHOLELITHIASIS 11/08/2007   Essential hypertension 10/17/2007   HYPERLIPIDEMIA 12/13/2006   ELEVATED PROSTATE SPECIFIC ANTIGEN 09/16/2006    Current Outpatient Medications on File Prior to Visit  Medication Sig Dispense Refill   amLODipine (NORVASC) 10 MG tablet Take 1 tablet by mouth once daily 90 tablet 0   aspirin 81 MG tablet Take 81 mg by mouth daily.     atorvastatin (LIPITOR) 80 MG tablet TAKE 1 TABLET BY MOUTH ONCE DAILY 6 IN THE EVENING 90 tablet 2   calcium carbonate (TUMS - DOSED IN MG ELEMENTAL CALCIUM) 500 MG chewable tablet Chew 1 tablet by mouth daily as needed.     Coenzyme Q10 (COQ10) 100 MG CAPS Take 1 capsule by mouth daily.     famotidine (PEPCID) 20 MG tablet Take 20 mg by mouth daily as needed for heartburn or indigestion.     ibuprofen (ADVIL) 200 MG tablet Take 600 mg by mouth daily as needed. Takes 3-4 times weekly     losartan (COZAAR) 100 MG tablet Take 1  tablet (100 mg total) by mouth daily. Please keep upcoming appt in March 2023 with Dr. Burt Knack before anymore refills. Thank you 90 tablet 1   metoprolol tartrate (LOPRESSOR) 25 MG tablet Take 1/2 (one-half) tablet by mouth twice daily 90 tablet 0   Multiple Vitamin (MULTIVITAMIN) tablet Take 1 tablet by mouth every other day.      nitroGLYCERIN (NITROSTAT) 0.4 MG SL tablet Place 1 tablet (0.4 mg total) under the tongue every 5 (five) minutes x 3 doses as needed for chest pain. 25 tablet 5   No current facility-administered medications on file prior to visit.    Past Medical History:  Diagnosis Date   Acute MI, inferolateral wall, initial episode of care ([place]) 07/30/2015   Coronary artery disease    Elevated PSA    Dr Barnie Del   Gilbert's syndrome    History of heat stroke 2009   History of nephrolithiasis    Hyperlipidemia    NMR 06/2009: LDL 88(1345/912), HDL 40, TG 127. Framingham Study LDL goal =< 130    Hypertension    Osteoarthritis of left hip    hip replacement right and left    Past Surgical History:  Procedure Laterality Date   CARDIAC CATHETERIZATION     CARDIAC CATHETERIZATION N/A 07/30/2015   Procedure: Left Heart Cath and Coronary Angiography;  Surgeon: Sherren Mocha, MD;  Location: [facility];  Service: Cardiovascular;  Laterality: N/A;  CARDIAC CATHETERIZATION N/A 07/30/2015   Procedure: Coronary Stent Intervention;  Surgeon: Sherren Mocha, MD;  Location: [facility];  Service: Cardiovascular;  Laterality: N/A;  Distal RCA- Promus 3.50x16   COLONOSCOPY  2011   negative   INGUINAL HERNIA REPAIR Right 10/19/2017   Procedure: RIGHT  INGUINAL HERNIA REPAIR ERAS PATHWAY;  Surgeon: Erroll Luna, MD;  Location: [facility];  Service: General;  Laterality: Right;  Tap block   INSERTION OF MESH Right 10/19/2017   Procedure: INSERTION OF MESH;  Surgeon: Erroll Luna, MD;  Location: [facility];  Service: General;  Laterality: Right;    LITHOTRIPSY  2009   Dr.Duckett, [place]    TOTAL HIP ARTHROPLASTY  2008   left   TOTAL HIP ARTHROPLASTY  2004   right    Social History   Socioeconomic History   Marital status: Married    Spouse name: Not on file   Number of children: 3   Years of education: Not on file   Highest education level: Not on file  Occupational History   Occupation: Chief Strategy Officer  Tobacco Use   Smoking status: Former    Packs/day: 1.00    Years: 32.00    Pack years: 32.00    Types: Cigarettes    Start date: 1970    Quit date: 01/26/2000    Years since quitting: 21.0   Smokeless tobacco: Never   Tobacco comments:    Pt stopped a few times.  Vaping Use   Vaping Use: Never used  Substance and Sexual Activity   Alcohol use: Yes    Comment: 15 beers/week   Drug use: No   Sexual activity: Yes    Birth control/protection: Other-see comments  Other Topics Concern   Not on file  Social History Narrative   Not on file   Social Determinants of Health   Financial Resource Strain: Low Risk    Difficulty of Paying Living Expenses: Not hard at all  Food Insecurity: No Food Insecurity   Worried About Charity fundraiser in the Last Year: Never true   [place] in the Last Year: Never true  Transportation Needs: No Transportation Needs   Lack of Transportation (Medical): No   Lack of Transportation (Non-Medical): No  Physical Activity: Sufficiently Active   Days of Exercise per Week: 5 days   Minutes of Exercise per Session: 30 min  Stress: No Stress Concern Present   Feeling of Stress : Not at all  Social Connections: Socially Integrated   Frequency of Communication with Friends and Family: More than three times a week   Frequency of Social Gatherings with Friends and Family: More than three times a week   Attends Religious Services: More than 4 times per year   Active Member of [employer] or Organizations: Yes   Attends Music therapist: More than 4 times per year   Marital  Status: Married    Family History  Problem Relation Age of Onset   Diabetes Mother    Hypertension Mother    Heart attack Father 78       smoker   Prostate cancer Brother 70       CAD; stents @ 56   Hypertension Brother    Heart attack Brother 10   Coronary artery disease Brother        stents late 50s   Stroke Neg Hx    Colon cancer Neg Hx    Colon polyps Neg  Hx    Esophageal cancer Neg Hx    Rectal cancer Neg Hx    Stomach cancer Neg Hx     Review of Systems  Constitutional:  Negative for chills and fever.  Respiratory:  Positive for cough (daily 4 times a day, mild - lasts 3-4 seconds). Negative for shortness of breath and wheezing.   Cardiovascular:  Negative for chest pain, palpitations and leg swelling.  Neurological:  Negative for light-headedness and headaches.      Objective:   Vitals:   02/09/21 0749  BP: 126/72  Pulse: 77  Temp: 98.1 F (36.7 C)  SpO2: 97%   BP Readings from Last 3 Encounters:  02/09/21 126/72  08/06/20 120/80  03/26/20 130/80   Wt Readings from Last 3 Encounters:  02/09/21 231 lb 9.6 oz (105.1 kg)  08/06/20 220 lb (99.8 kg)  03/26/20 224 lb 6.4 oz (101.8 kg)   Body mass index is 32.3 kg/m.   Physical Exam    Constitutional: Appears well-developed and well-nourished. No distress.  HENT:  Head: Normocephalic and atraumatic.  Neck: Neck supple. No tracheal deviation present. No thyromegaly present.  No cervical lymphadenopathy Cardiovascular: Normal rate, regular rhythm and normal heart sounds.   No murmur heard. No carotid bruit .  No edema Pulmonary/Chest: Effort normal and breath sounds normal. No respiratory distress. No has no wheezes. No rales.  Skin: Skin is warm and dry. Not diaphoretic.  Psychiatric: Normal mood and affect. Behavior is normal.      Assessment & Plan:    See Problem List for Assessment and Plan of chronic medical problems.

## 2021-02-09 ENCOUNTER — Ambulatory Visit (INDEPENDENT_AMBULATORY_CARE_PROVIDER_SITE_OTHER): Payer: Medicare Other | Admitting: Internal Medicine

## 2021-02-09 ENCOUNTER — Other Ambulatory Visit: Payer: Self-pay

## 2021-02-09 VITALS — BP 126/72 | HR 77 | Temp 98.1°F | Ht 71.0 in | Wt 231.6 lb

## 2021-02-09 DIAGNOSIS — R7303 Prediabetes: Secondary | ICD-10-CM

## 2021-02-09 DIAGNOSIS — E782 Mixed hyperlipidemia: Secondary | ICD-10-CM

## 2021-02-09 DIAGNOSIS — R972 Elevated prostate specific antigen [PSA]: Secondary | ICD-10-CM | POA: Diagnosis not present

## 2021-02-09 DIAGNOSIS — I10 Essential (primary) hypertension: Secondary | ICD-10-CM | POA: Diagnosis not present

## 2021-02-09 DIAGNOSIS — I2510 Atherosclerotic heart disease of native coronary artery without angina pectoris: Secondary | ICD-10-CM

## 2021-02-09 LAB — CBC WITH DIFFERENTIAL/PLATELET
Basophils Absolute: 0.1 K/uL (ref 0.0–0.1)
Basophils Relative: 0.9 % (ref 0.0–3.0)
Eosinophils Absolute: 0.1 K/uL (ref 0.0–0.7)
Eosinophils Relative: 1.6 % (ref 0.0–5.0)
HCT: 44.7 % (ref 39.0–52.0)
Hemoglobin: 15.3 g/dL (ref 13.0–17.0)
Lymphocytes Relative: 28.8 % (ref 12.0–46.0)
Lymphs Abs: 1.7 K/uL (ref 0.7–4.0)
MCHC: 34.1 g/dL (ref 30.0–36.0)
MCV: 93.9 fl (ref 78.0–100.0)
Monocytes Absolute: 0.5 K/uL (ref 0.1–1.0)
Monocytes Relative: 7.8 % (ref 3.0–12.0)
Neutro Abs: 3.7 K/uL (ref 1.4–7.7)
Neutrophils Relative %: 60.9 % (ref 43.0–77.0)
Platelets: 234 K/uL (ref 150.0–400.0)
RBC: 4.76 Mil/uL (ref 4.22–5.81)
RDW: 12.5 % (ref 11.5–15.5)
WBC: 6 K/uL (ref 4.0–10.5)

## 2021-02-09 LAB — COMPREHENSIVE METABOLIC PANEL
ALT: 32 U/L (ref 0–53)
AST: 22 U/L (ref 0–37)
Albumin: 4.8 g/dL (ref 3.5–5.2)
Alkaline Phosphatase: 61 U/L (ref 39–117)
BUN: 16 mg/dL (ref 6–23)
CO2: 25 mEq/L (ref 19–32)
Calcium: 9.8 mg/dL (ref 8.4–10.5)
Chloride: 103 mEq/L (ref 96–112)
Creatinine, Ser: 0.95 mg/dL (ref 0.40–1.50)
GFR: 80.96 mL/min (ref >60.00)
Glucose, Bld: 121 mg/dL — ABNORMAL HIGH (ref 70–99)
Potassium: 4 mEq/L (ref 3.5–5.1)
Sodium: 138 mEq/L (ref 135–145)
Total Bilirubin: 0.9 mg/dL (ref 0.2–1.2)
Total Protein: 7.4 g/dL (ref 6.0–8.3)

## 2021-02-09 LAB — HEMOGLOBIN A1C: Hgb A1c MFr Bld: 6.2 % (ref 4.6–6.5)

## 2021-02-09 LAB — LIPID PANEL
Cholesterol: 95 mg/dL (ref 0–200)
HDL: 35.2 mg/dL — ABNORMAL LOW (ref >39.00)
LDL Cholesterol: 32 mg/dL (ref 0–99)
NonHDL: 60.19
Total CHOL/HDL Ratio: 3
Triglycerides: 140 mg/dL (ref 0.0–149.0)
VLDL: 28 mg/dL (ref 0.0–40.0)

## 2021-02-09 LAB — TSH: TSH: 1.86 u[IU]/mL (ref 0.35–5.50)

## 2021-02-09 NOTE — Assessment & Plan Note (Signed)
Chronic Blood pressure well controlled CMP Continue amlodipine 10 mg daily, losartan 100 mg daily, metoprolol 12.5 mg twice daily 

## 2021-02-09 NOTE — Assessment & Plan Note (Signed)
Chronic Regular exercise and healthy diet encouraged Check lipid panel  Continue atorvastatin 80 mg daily 

## 2021-02-09 NOTE — Assessment & Plan Note (Signed)
Chronic Check a1c Low sugar / carb diet Stressed regular exercise  

## 2021-02-09 NOTE — Assessment & Plan Note (Signed)
Following with urology

## 2021-02-09 NOTE — Assessment & Plan Note (Signed)
Chronic Following with cardiology No symptoms of angina New aspirin 81 mg daily, atorvastatin 80 mg daily Blood pressure well controlled Healthy diet encouraged along with regular exercise

## 2021-02-18 ENCOUNTER — Other Ambulatory Visit: Payer: Self-pay | Admitting: Internal Medicine

## 2021-03-02 ENCOUNTER — Other Ambulatory Visit: Payer: Self-pay | Admitting: Internal Medicine

## 2021-03-16 ENCOUNTER — Other Ambulatory Visit: Payer: Self-pay | Admitting: Internal Medicine

## 2021-03-24 ENCOUNTER — Telehealth: Payer: Self-pay | Admitting: Internal Medicine

## 2021-03-24 NOTE — Telephone Encounter (Signed)
LVM for pt to rtn my call to schedule AWV with [facility]. Please schedule if pt calls the office.  ?

## 2021-04-01 ENCOUNTER — Encounter: Payer: Self-pay | Admitting: Cardiovascular Disease

## 2021-04-16 ENCOUNTER — Ambulatory Visit: Payer: Medicare Other | Admitting: Cardiovascular Disease

## 2021-04-24 ENCOUNTER — Ambulatory Visit (INDEPENDENT_AMBULATORY_CARE_PROVIDER_SITE_OTHER): Payer: Medicare Other

## 2021-04-24 ENCOUNTER — Other Ambulatory Visit: Payer: Self-pay | Admitting: Cardiovascular Disease

## 2021-04-24 DIAGNOSIS — Z0000 Encounter for general adult medical examination without abnormal findings: Secondary | ICD-10-CM

## 2021-04-24 NOTE — Progress Notes (Signed)
? ?Subjective:  ? Mike Ray is a 71 y.o. male who presents for an Subsequent Medicare Annual Wellness Visit. ? ?I connected with Mike Ray today by telephone and verified that I am speaking with the correct person using two identifiers. ?Location patient: home ?Location provider: work ?Persons participating in the virtual visit: patient, provider. ?  ?I discussed the limitations, risks, security and privacy concerns of performing an evaluation and management service by telephone and the availability of in person appointments. I also discussed with the patient that there may be a patient responsible charge related to this service. The patient expressed understanding and verbally consented to this telephonic visit.  ?  ?Interactive audio and video telecommunications were attempted between this provider and patient, however failed, due to patient having technical difficulties OR patient did not have access to video capability.  We continued and completed visit with audio only. ? ?  ?Review of Systems    ? ?Cardiac Risk Factors include: advanced age (>32mn, >>26women);dyslipidemia;male gender;hypertension ? ?   ?Objective:  ?  ?Today's Vitals  ? ?There is no height or weight on file to calculate BMI. ? ? ?  04/24/2021  ?  8:55 AM 03/26/2020  ?  8:23 AM 01/11/2018  ?  8:14 AM 10/19/2017  ?  6:23 AM 10/14/2017  ? 11:31 AM 01/10/2017  ?  9:19 AM 07/30/2015  ?  5:00 PM  ?Advanced Directives  ?Does Patient Have a Medical Advance Directive? Yes Yes Yes Yes Yes Yes Yes  ?Type of AParamedicof ABreconLiving will [place]Living will [place]Living will Living will Living will HLuna PierLiving will HMuensterLiving will  ?Does patient want to make changes to medical advance directive?  No - Patient declined  No - Patient declined No - Patient declined  No - Patient declined  ?Copy of [place]  Chart? No - copy requested No - copy requested No - copy requested No - copy requested No - copy requested No - copy requested No - copy requested  ? ? ?Current Medications (verified) ?Outpatient Encounter Medications as of 04/24/2021  ?Medication Sig  ? amLODipine (NORVASC) 10 MG tablet Take 1 tablet by mouth once daily  ? aspirin 81 MG tablet Take 81 mg by mouth daily.  ? atorvastatin (LIPITOR) 80 MG tablet TAKE 1 TABLET BY MOUTH ONCE DAILY IN THE EVENING  ? calcium carbonate (TUMS - DOSED IN MG ELEMENTAL CALCIUM) 500 MG chewable tablet Chew 1 tablet by mouth daily as needed.  ? Coenzyme Q10 (COQ10) 100 MG CAPS Take 1 capsule by mouth daily.  ? famotidine (PEPCID) 20 MG tablet Take 20 mg by mouth daily as needed for heartburn or indigestion.  ? ibuprofen (ADVIL) 200 MG tablet Take 600 mg by mouth daily as needed. Takes 3-4 times weekly  ? losartan (COZAAR) 100 MG tablet Take 1 tablet (100 mg total) by mouth daily. Please keep upcoming appt in March 2023 with Mike. CBurt Knackbefore anymore refills. Thank you  ? metoprolol tartrate (LOPRESSOR) 25 MG tablet Take 1/2 (one-half) tablet by mouth twice daily  ? Multiple Vitamin (MULTIVITAMIN) tablet Take 1 tablet by mouth every other day.   ? nitroGLYCERIN (NITROSTAT) 0.4 MG SL tablet Place 1 tablet (0.4 mg total) under the tongue every 5 (five) minutes x 3 doses as needed for chest pain.  ? ?No facility-administered encounter medications on file as of 04/24/2021.  ? ? ?  Allergies (verified) ?Patient has no known allergies.  ? ?History: ?Past Medical History:  ?Diagnosis Date  ? Acute MI, inferolateral wall, initial episode of care ([place]) 07/30/2015  ? Coronary artery disease   ? Elevated PSA   ? Mike Ray  ? Gilbert's syndrome   ? History of heat stroke 2009  ? History of nephrolithiasis   ? Hyperlipidemia   ? NMR 06/2009: LDL 15(4008/676), HDL 40, TG 127. Framingham Study LDL goal =< 130   ? Hypertension   ? Osteoarthritis of left hip   ? hip replacement right and left  ? ?Past  Surgical History:  ?Procedure Laterality Date  ? CARDIAC CATHETERIZATION    ? CARDIAC CATHETERIZATION N/A 07/30/2015  ? Procedure: Left Heart Cath and Coronary Angiography;  Surgeon: Mike Ray;  Location: [facility];  Service: Cardiovascular;  Laterality: N/A;  ? CARDIAC CATHETERIZATION N/A 07/30/2015  ? Procedure: Coronary Stent Intervention;  Surgeon: Mike Ray;  Location: [facility];  Service: Cardiovascular;  Laterality: N/A;  Distal RCA- Promus [id number]  ? COLONOSCOPY  2011  ? negative  ? INGUINAL HERNIA REPAIR Right 10/19/2017  ? Procedure: RIGHT  INGUINAL HERNIA REPAIR ERAS PATHWAY;  Surgeon: Mike Luna, Ray;  Location: [place];  Service: General;  Laterality: Right;  Tap block  ? INSERTION OF MESH Right 10/19/2017  ? Procedure: INSERTION OF MESH;  Surgeon: Mike Luna, Ray;  Location: [facility];  Service: General;  Laterality: Right;  ? LITHOTRIPSY  2009  ? Mike Ray, [place]   ? TOTAL HIP ARTHROPLASTY  2008  ? left  ? TOTAL HIP ARTHROPLASTY  2004  ? right  ? ?Family History  ?Problem Relation Age of Onset  ? Diabetes Mother   ? Hypertension Mother   ? Heart attack Father 79  ?     smoker  ? Prostate cancer Brother 41  ?     CAD; stents @ 7  ? Hypertension Brother   ? Heart attack Brother 21  ? Coronary artery disease Brother   ?     stents late 63s  ? Stroke Neg Hx   ? Colon cancer Neg Hx   ? Colon polyps Neg Hx   ? Esophageal cancer Neg Hx   ? Rectal cancer Neg Hx   ? Stomach cancer Neg Hx   ? ?Social History  ? ?Socioeconomic History  ? Marital status: Married  ?  Spouse name: Not on file  ? Number of children: 3  ? Years of education: Not on file  ? Highest education level: Not on file  ?Occupational History  ? Occupation: Chief Strategy Officer  ?Tobacco Use  ? Smoking status: Former  ?  Packs/day: 1.00  ?  Years: 32.00  ?  Pack years: 32.00  ?  Types: Cigarettes  ?  Start date: 1970  ?  Quit date: 01/26/2000  ?  Years since quitting: 21.2  ?  Smokeless tobacco: Never  ? Tobacco comments:  ?  Pt stopped a few times.  ?Vaping Use  ? Vaping Use: Never used  ?Substance and Sexual Activity  ? Alcohol use: Yes  ?  Comment: 15 beers/week  ? Drug use: No  ? Sexual activity: Yes  ?  Birth control/protection: Other-see comments  ?Other Topics Concern  ? Not on file  ?Social History Narrative  ? Not on file  ? ?Social Determinants of Health  ? ?Financial Resource Strain: Low Risk   ? Difficulty of Paying  Living Expenses: Not hard at all  ?Food Insecurity: No Food Insecurity  ? Worried About Charity fundraiser in the Last Year: Never true  ? Ran Out of Food in the Last Year: Never true  ?Transportation Needs: No Transportation Needs  ? Lack of Transportation (Medical): No  ? Lack of Transportation (Non-Medical): No  ?Physical Activity: Sufficiently Active  ? Days of Exercise per Week: 5 days  ? Minutes of Exercise per Session: 50 min  ?Stress: No Stress Concern Present  ? Feeling of Stress : Not at all  ?Social Connections: Moderately Integrated  ? Frequency of Communication with Friends and Family: Three times a week  ? Frequency of Social Gatherings with Friends and Family: Three times a week  ? Attends Religious Services: More than 4 times per year  ? Active Member of Clubs or Organizations: No  ? Attends Archivist Meetings: Never  ? Marital Status: Married  ? ? ?Tobacco Counseling ?Counseling given: Not Answered ?Tobacco comments: Pt stopped a few times. ? ? ?Clinical Intake: ? ?Pre-visit preparation completed: Yes ? ?Pain : No/denies pain ? ?  ? ?Nutritional Risks: None ?Diabetes: No ? ?How often do you need to have someone help you when you read instructions, pamphlets, or other written materials from your doctor or pharmacy?: 1 - Never ?What is the last grade level you completed in school?: college ? ?Diabetic?no  ? ?Interpreter Needed?: No ? ?Information entered by :: [email or web address] ? ? ?Activities of Daily Living ? ?  04/24/2021  ?  8:58 AM  ?In  your present state of health, do you have any difficulty performing the following activities:  ?Hearing? 0  ?Vision? 0  ?Difficulty concentrating or making decisions? 0  ?Walking or climbing stairs? 0  ?Josephine Cables

## 2021-04-24 NOTE — Patient Instructions (Signed)
Mike Ray , ?Thank you for taking time to come for your Medicare Wellness Visit. I appreciate your ongoing commitment to your health goals. Please review the following plan we discussed and let me know if I can assist you in the future.  ? ?Screening recommendations/referrals: ?Colonoscopy: 03/02/2020  due 2025 ?Recommended yearly ophthalmology/optometry visit for glaucoma screening and checkup ?Recommended yearly dental visit for hygiene and checkup ? ?Vaccinations: ?Influenza vaccine: completed  ?Pneumococcal vaccine: completed  ?Tdap vaccine: 05/29/2012 ?Shingles vaccine: completed    ? ?Advanced directives: yes  ? ?Conditions/risks identified: none  ? ?Next appointment: none  ? ?Preventive Care 16 Years and Older, Male ?Preventive care refers to lifestyle choices and visits with your health care provider that can promote health and wellness. ?What does preventive care include? ?A yearly physical exam. This is also called an annual well check. ?Dental exams once or twice a year. ?Routine eye exams. Ask your health care provider how often you should have your eyes checked. ?Personal lifestyle choices, including: ?Daily care of your teeth and gums. ?Regular physical activity. ?Eating a healthy diet. ?Avoiding tobacco and drug use. ?Limiting alcohol use. ?Practicing safe sex. ?Taking low doses of aspirin every day. ?Taking vitamin and mineral supplements as recommended by your health care provider. ?What happens during an annual well check? ?The services and screenings done by your health care provider during your annual well check will depend on your age, overall health, lifestyle risk factors, and family history of disease. ?Counseling  ?Your health care provider may ask you questions about your: ?Alcohol use. ?Tobacco use. ?Drug use. ?Emotional well-being. ?Home and relationship well-being. ?Sexual activity. ?Eating habits. ?History of falls. ?Memory and ability to understand (cognition). ?Work and work  Statistician. ?Screening  ?You may have the following tests or measurements: ?Height, weight, and BMI. ?Blood pressure. ?Lipid and cholesterol levels. These may be checked every 5 years, or more frequently if you are over 38 years old. ?Skin check. ?Lung cancer screening. You may have this screening every year starting at age 12 if you have a 30-pack-year history of smoking and currently smoke or have quit within the past 15 years. ?Fecal occult blood test (FOBT) of the stool. You may have this test every year starting at age 1. ?Flexible sigmoidoscopy or colonoscopy. You may have a sigmoidoscopy every 5 years or a colonoscopy every 10 years starting at age 79. ?Prostate cancer screening. Recommendations will vary depending on your family history and other risks. ?Hepatitis C blood test. ?Hepatitis B blood test. ?Sexually transmitted disease (STD) testing. ?Diabetes screening. This is done by checking your blood sugar (glucose) after you have not eaten for a while (fasting). You may have this done every 1-3 years. ?Abdominal aortic aneurysm (AAA) screening. You may need this if you are a current or former smoker. ?Osteoporosis. You may be screened starting at age 85 if you are at high risk. ?Talk with your health care provider about your test results, treatment options, and if necessary, the need for more tests. ?Vaccines  ?Your health care provider may recommend certain vaccines, such as: ?Influenza vaccine. This is recommended every year. ?Tetanus, diphtheria, and acellular pertussis (Tdap, Td) vaccine. You may need a Td booster every 10 years. ?Zoster vaccine. You may need this after age 35. ?Pneumococcal 13-valent conjugate (PCV13) vaccine. One dose is recommended after age 56. ?Pneumococcal polysaccharide (PPSV23) vaccine. One dose is recommended after age 8. ?Talk to your health care provider about which screenings and vaccines you need and how  often you need them. ?This information is not intended to replace  advice given to you by your health care provider. Make sure you discuss any questions you have with your health care provider. ?Document Released: 02/07/2015 Document Revised: 10/01/2015 Document Reviewed: 11/12/2014 ?Elsevier Interactive Patient Education ? 2017 [place]. ? ?Fall Prevention in the Home ?Falls can cause injuries. They can happen to people of all ages. There are many things you can do to make your home safe and to help prevent falls. ?What can I do on the outside of my home? ?Regularly fix the edges of walkways and driveways and fix any cracks. ?Remove anything that might make you trip as you walk through a door, such as a raised step or threshold. ?Trim any bushes or trees on the path to your home. ?Use bright outdoor lighting. ?Clear any walking paths of anything that might make someone trip, such as rocks or tools. ?Regularly check to see if handrails are loose or broken. Make sure that both sides of any steps have handrails. ?Any raised decks and porches should have guardrails on the edges. ?Have any leaves, snow, or ice cleared regularly. ?Use sand or salt on walking paths during winter. ?Clean up any spills in your garage right away. This includes oil or grease spills. ?What can I do in the bathroom? ?Use night lights. ?Install grab bars by the toilet and in the tub and shower. Do not use towel bars as grab bars. ?Use non-skid mats or decals in the tub or shower. ?If you need to sit down in the shower, use a plastic, non-slip stool. ?Keep the floor dry. Clean up any water that spills on the floor as soon as it happens. ?Remove soap buildup in the tub or shower regularly. ?Attach bath mats securely with double-sided non-slip rug tape. ?Do not have throw rugs and other things on the floor that can make you trip. ?What can I do in the bedroom? ?Use night lights. ?Make sure that you have a light by your bed that is easy to reach. ?Do not use any sheets or blankets that are too big for your bed.  They should not hang down onto the floor. ?Have a firm chair that has side arms. You can use this for support while you get dressed. ?Do not have throw rugs and other things on the floor that can make you trip. ?What can I do in the kitchen? ?Clean up any spills right away. ?Avoid walking on wet floors. ?Keep items that you use a lot in easy-to-reach places. ?If you need to reach something above you, use a strong step stool that has a grab bar. ?Keep electrical cords out of the way. ?Do not use floor polish or wax that makes floors slippery. If you must use wax, use non-skid floor wax. ?Do not have throw rugs and other things on the floor that can make you trip. ?What can I do with my stairs? ?Do not leave any items on the stairs. ?Make sure that there are handrails on both sides of the stairs and use them. Fix handrails that are broken or loose. Make sure that handrails are as long as the stairways. ?Check any carpeting to make sure that it is firmly attached to the stairs. Fix any carpet that is loose or worn. ?Avoid having throw rugs at the top or bottom of the stairs. If you do have throw rugs, attach them to the floor with carpet tape. ?Make sure that you have a  light switch at the top of the stairs and the bottom of the stairs. If you do not have them, ask someone to add them for you. ?What else can I do to help prevent falls? ?Wear shoes that: ?Do not have high heels. ?Have rubber bottoms. ?Are comfortable and fit you well. ?Are closed at the toe. Do not wear sandals. ?If you use a stepladder: ?Make sure that it is fully opened. Do not climb a closed stepladder. ?Make sure that both sides of the stepladder are locked into place. ?Ask someone to hold it for you, if possible. ?Clearly mark and make sure that you can see: ?Any grab bars or handrails. ?First and last steps. ?Where the edge of each step is. ?Use tools that help you move around (mobility aids) if they are needed. These  include: ?Canes. ?Walkers. ?Scooters. ?Crutches. ?Turn on the lights when you go into a dark area. Replace any light bulbs as soon as they burn out. ?Set up your furniture so you have a clear path. Avoid moving your furniture around.

## 2021-05-04 ENCOUNTER — Other Ambulatory Visit: Payer: Self-pay | Admitting: Cardiovascular Disease

## 2021-05-18 ENCOUNTER — Other Ambulatory Visit: Payer: Self-pay | Admitting: Internal Medicine

## 2021-05-21 DIAGNOSIS — I781 Nevus, non-neoplastic: Secondary | ICD-10-CM | POA: Diagnosis not present

## 2021-05-21 DIAGNOSIS — L814 Other melanin hyperpigmentation: Secondary | ICD-10-CM | POA: Diagnosis not present

## 2021-05-21 DIAGNOSIS — Z85828 Personal history of other malignant neoplasm of skin: Secondary | ICD-10-CM | POA: Diagnosis not present

## 2021-05-21 DIAGNOSIS — D485 Neoplasm of uncertain behavior of skin: Secondary | ICD-10-CM | POA: Diagnosis not present

## 2021-05-21 DIAGNOSIS — C44319 Basal cell carcinoma of skin of other parts of face: Secondary | ICD-10-CM | POA: Diagnosis not present

## 2021-05-21 DIAGNOSIS — L578 Other skin changes due to chronic exposure to nonionizing radiation: Secondary | ICD-10-CM | POA: Diagnosis not present

## 2021-05-21 DIAGNOSIS — L570 Actinic keratosis: Secondary | ICD-10-CM | POA: Diagnosis not present

## 2021-05-21 DIAGNOSIS — D1801 Hemangioma of skin and subcutaneous tissue: Secondary | ICD-10-CM | POA: Diagnosis not present

## 2021-05-21 DIAGNOSIS — X32XXXS Exposure to sunlight, sequela: Secondary | ICD-10-CM | POA: Diagnosis not present

## 2021-05-21 DIAGNOSIS — L821 Other seborrheic keratosis: Secondary | ICD-10-CM | POA: Diagnosis not present

## 2021-06-01 ENCOUNTER — Other Ambulatory Visit: Payer: Self-pay | Admitting: Internal Medicine

## 2021-06-09 ENCOUNTER — Encounter: Payer: Self-pay | Admitting: Cardiovascular Disease

## 2021-06-10 MED ORDER — SILDENAFIL CITRATE 20 MG PO TABS: ORAL_TABLET | ORAL | 3 refills | Status: DC

## 2021-06-10 NOTE — Addendum Note (Signed)
Addended by: Ma Hillock on: 06/10/2021 05:02 PM ? ? Modules accepted: Orders ? ?

## 2021-06-10 NOTE — Telephone Encounter (Signed)
Yes this is fine. Sildenafil 20 mg, take 2-5 as needed. ? ?thx ?

## 2021-06-15 ENCOUNTER — Other Ambulatory Visit: Payer: Self-pay | Admitting: Internal Medicine

## 2021-06-24 DIAGNOSIS — C44319 Basal cell carcinoma of skin of other parts of face: Secondary | ICD-10-CM | POA: Diagnosis not present

## 2021-07-01 DIAGNOSIS — L905 Scar conditions and fibrosis of skin: Secondary | ICD-10-CM | POA: Diagnosis not present

## 2021-07-08 DIAGNOSIS — M19011 Primary osteoarthritis, right shoulder: Secondary | ICD-10-CM | POA: Diagnosis not present

## 2021-07-28 ENCOUNTER — Other Ambulatory Visit: Payer: Self-pay | Admitting: Cardiovascular Disease

## 2021-08-11 NOTE — Patient Instructions (Addendum)
     Blood work was ordered.     Medications changes include :      Your prescription(s) have been sent to your pharmacy.    A referral was ordered for XX.     Someone from that office will call you to schedule an appointment.    Return in about 6 months (around 02/12/2022) for follow up.

## 2021-08-11 NOTE — Progress Notes (Unsigned)
Subjective:    Patient ID: Mike Ray, male    DOB: 29-Apr-1950, 71 y.o.   MRN: [id number]     HPI Mike Ray is here for follow up of his chronic medical problems, including CAD, htn, hld, prediabetes  He swims 2/week.  He is trying to watch his sugar.    Medications and allergies reviewed with patient and updated if appropriate.  Current Outpatient Medications on File Prior to Visit  Medication Sig Dispense Refill   amLODipine (NORVASC) 10 MG tablet Take 1 tablet by mouth once daily 90 tablet 1   aspirin 81 MG tablet Take 81 mg by mouth daily.     atorvastatin (LIPITOR) 80 MG tablet TAKE 1 TABLET BY MOUTH ONCE DAILY IN THE EVENING 90 tablet 0   calcium carbonate (TUMS - DOSED IN MG ELEMENTAL CALCIUM) 500 MG chewable tablet Chew 1 tablet by mouth daily as needed.     Coenzyme Q10 (COQ10) 100 MG CAPS Take 1 capsule by mouth daily.     famotidine (PEPCID) 20 MG tablet Take 20 mg by mouth daily as needed for heartburn or indigestion.     ibuprofen (ADVIL) 200 MG tablet Take 600 mg by mouth daily as needed. Takes 3-4 times weekly     losartan (COZAAR) 100 MG tablet Take 1 tablet (100 mg total) by mouth daily. 30 tablet 1   metoprolol tartrate (LOPRESSOR) 25 MG tablet Take 1/2 (one-half) tablet by mouth twice daily 90 tablet 0   Multiple Vitamin (MULTIVITAMIN) tablet Take 1 tablet by mouth every other day.      nitroGLYCERIN (NITROSTAT) 0.4 MG SL tablet DISSOLVE ONE TABLET UNDER THE TONGUE EVERY 5 MINUTES AS NEEDED FOR CHEST PAIN.  DO NOT EXCEED A TOTAL OF 3 DOSES IN 15 MINUTES 25 tablet 0   sildenafil (REVATIO) 20 MG tablet Take 2-5 tablets by mouth as needed for erectile dysfunction. Do not use with nitroglycerin. 90 tablet 3   No current facility-administered medications on file prior to visit.     Review of Systems  Constitutional:  Negative for fever.  Respiratory:  Negative for cough, shortness of breath and wheezing.   Cardiovascular:  Positive for palpitations  (chronic - rare - less often). Negative for chest pain and leg swelling.  Neurological:  Negative for light-headedness and headaches.       Objective:   Vitals:   08/12/21 0910  BP: 120/76  Pulse: 70  Temp: 98.1 F (36.7 C)  SpO2: 98%   BP Readings from Last 3 Encounters:  08/12/21 120/76  02/09/21 126/72  08/06/20 120/80   Wt Readings from Last 3 Encounters:  08/12/21 226 lb (102.5 kg)  02/09/21 231 lb 9.6 oz (105.1 kg)  08/06/20 220 lb (99.8 kg)   Body mass index is 31.52 kg/m.    Physical Exam Constitutional:      General: He is not in acute distress.    Appearance: Normal appearance. He is not ill-appearing.  HENT:     Head: Normocephalic and atraumatic.  Eyes:     Conjunctiva/sclera: Conjunctivae normal.  Cardiovascular:     Rate and Rhythm: Normal rate and regular rhythm.     Heart sounds: Normal heart sounds. No murmur heard. Pulmonary:     Effort: Pulmonary effort is normal. No respiratory distress.     Breath sounds: Normal breath sounds. No wheezing or rales.  Musculoskeletal:     Right lower leg: No edema.     Left lower leg: No  edema.  Skin:    General: Skin is warm and dry.     Findings: No rash.  Neurological:     Mental Status: He is alert. Mental status is at baseline.  Psychiatric:        Mood and Affect: Mood normal.        Lab Results  Component Value Date   WBC 6.0 02/09/2021   HGB 15.3 02/09/2021   HCT 44.7 02/09/2021   PLT 234.0 02/09/2021   GLUCOSE 121 (H) 02/09/2021   CHOL 95 02/09/2021   TRIG 140.0 02/09/2021   HDL 35.20 (L) 02/09/2021   LDLDIRECT 117.0 07/10/2014   LDLCALC 32 02/09/2021   ALT 32 02/09/2021   AST 22 02/09/2021   NA 138 02/09/2021   K 4.0 02/09/2021   CL 103 02/09/2021   CREATININE 0.95 02/09/2021   BUN 16 02/09/2021   CO2 25 02/09/2021   TSH 1.86 02/09/2021   PSA 2.37 08/06/2019   INR 1.20 07/30/2015   HGBA1C 6.2 02/09/2021     Assessment & Plan:    See Problem List for Assessment and Plan  of chronic medical problems.

## 2021-08-12 ENCOUNTER — Encounter: Payer: Self-pay | Admitting: Internal Medicine

## 2021-08-12 ENCOUNTER — Ambulatory Visit (INDEPENDENT_AMBULATORY_CARE_PROVIDER_SITE_OTHER): Payer: Medicare Other | Admitting: Internal Medicine

## 2021-08-12 VITALS — BP 120/76 | HR 70 | Temp 98.1°F | Ht 71.0 in | Wt 226.0 lb

## 2021-08-12 DIAGNOSIS — I10 Essential (primary) hypertension: Secondary | ICD-10-CM | POA: Diagnosis not present

## 2021-08-12 DIAGNOSIS — I2510 Atherosclerotic heart disease of native coronary artery without angina pectoris: Secondary | ICD-10-CM | POA: Diagnosis not present

## 2021-08-12 DIAGNOSIS — R7303 Prediabetes: Secondary | ICD-10-CM

## 2021-08-12 DIAGNOSIS — E782 Mixed hyperlipidemia: Secondary | ICD-10-CM

## 2021-08-12 LAB — COMPREHENSIVE METABOLIC PANEL
ALT: 30 U/L (ref 0–53)
AST: 24 U/L (ref 0–37)
Albumin: 4.9 g/dL (ref 3.5–5.2)
Alkaline Phosphatase: 53 U/L (ref 39–117)
BUN: 16 mg/dL (ref 6–23)
CO2: 26 mEq/L (ref 19–32)
Calcium: 9.6 mg/dL (ref 8.4–10.5)
Chloride: 104 mEq/L (ref 96–112)
Creatinine, Ser: 0.86 mg/dL (ref 0.40–1.50)
GFR: 87.28 mL/min (ref >60.00)
Glucose, Bld: 115 mg/dL — ABNORMAL HIGH (ref 70–99)
Potassium: 4.5 mEq/L (ref 3.5–5.1)
Sodium: 139 mEq/L (ref 135–145)
Total Bilirubin: 1.2 mg/dL (ref 0.2–1.2)
Total Protein: 7.4 g/dL (ref 6.0–8.3)

## 2021-08-12 LAB — HEMOGLOBIN A1C: Hgb A1c MFr Bld: 6.3 % (ref 4.6–6.5)

## 2021-08-12 LAB — LIPID PANEL
Cholesterol: 99 mg/dL (ref 0–200)
HDL: 34.7 mg/dL — ABNORMAL LOW (ref >39.00)
LDL Cholesterol: 32 mg/dL (ref 0–99)
NonHDL: 63.94
Total CHOL/HDL Ratio: 3
Triglycerides: 162 mg/dL — ABNORMAL HIGH (ref 0.0–149.0)
VLDL: 32.4 mg/dL (ref 0.0–40.0)

## 2021-08-12 NOTE — Assessment & Plan Note (Signed)
Chronic Check a1c Low sugar / carb diet Stressed regular exercise  

## 2021-08-12 NOTE — Assessment & Plan Note (Signed)
Chronic Following with cardiology No symptoms consistent with angina Continue aspirin 81 mg daily, atorvastatin 80 mg daily Continue regular exercise, healthy diet

## 2021-08-12 NOTE — Assessment & Plan Note (Signed)
Chronic Regular exercise and healthy diet encouraged Check lipid panel  Continue atorvastatin 80 mg daily 

## 2021-08-12 NOTE — Assessment & Plan Note (Signed)
Chronic Blood pressure well controlled CMP Continue amlodipine 10 mg daily, losartan 100 mg daily, metoprolol 12.5 mg twice daily 

## 2021-08-18 ENCOUNTER — Other Ambulatory Visit: Payer: Self-pay | Admitting: Internal Medicine

## 2021-08-25 ENCOUNTER — Other Ambulatory Visit: Payer: Self-pay | Admitting: Cardiovascular Disease

## 2021-08-26 ENCOUNTER — Encounter: Payer: Self-pay | Admitting: Cardiovascular Disease

## 2021-08-26 ENCOUNTER — Ambulatory Visit (INDEPENDENT_AMBULATORY_CARE_PROVIDER_SITE_OTHER): Payer: Medicare Other | Admitting: Cardiovascular Disease

## 2021-08-26 VITALS — BP 120/80 | HR 66 | Ht 71.0 in | Wt 226.0 lb

## 2021-08-26 DIAGNOSIS — I10 Essential (primary) hypertension: Secondary | ICD-10-CM | POA: Diagnosis not present

## 2021-08-26 DIAGNOSIS — E782 Mixed hyperlipidemia: Secondary | ICD-10-CM

## 2021-08-26 DIAGNOSIS — I2510 Atherosclerotic heart disease of native coronary artery without angina pectoris: Secondary | ICD-10-CM | POA: Diagnosis not present

## 2021-08-26 MED ORDER — NITROGLYCERIN 0.4 MG SL SUBL: SUBLINGUAL_TABLET | SUBLINGUAL | 3 refills | Status: DC

## 2021-08-26 NOTE — Patient Instructions (Signed)
Medication Instructions:  REFILLED Nitroglycerin *If you need a refill on your cardiac medications before your next appointment, please call your pharmacy*   Lab Work: NONE If you have labs (blood work) drawn today and your tests are completely normal, you will receive your results only by: [facility] (if you have MyChart) OR A paper copy in the mail If you have any lab test that is abnormal or we need to change your treatment, we will call you to review the results.   Testing/Procedures: NONE   Follow-Up: At [facility], you and your health needs are our priority.  As part of our continuing mission to provide you with exceptional heart care, we have created designated Provider Care Teams.  These Care Teams include your primary Cardiologist (physician) and Advanced Practice Providers (APPs -  Physician Assistants and Nurse Practitioners) who all work together to provide you with the care you need, when you need it.  We recommend signing up for the patient portal called "MyChart".  Sign up information is provided on this After Visit Summary.  MyChart is used to connect with patients for Virtual Visits (Telemedicine).  Patients are able to view lab/test results, encounter notes, upcoming appointments, etc.  Non-urgent messages can be sent to your provider as well.   To learn more about what you can do with MyChart, go to [email or web address].    Your next appointment:   1 year(s)  The format for your next appointment:   In Person  Provider:   Sherren Mocha, MD      Important Information About Sugar

## 2021-08-26 NOTE — Progress Notes (Signed)
Cardiology Office Note:    Date:  08/26/2021   ID:  Mike Ray, DOB February 02, 1950, MRN [id number]  PCP:  Binnie Rail, MD   [facility] Cardiologist:  Sherren Mocha, MD     Referring MD: Binnie Rail, MD   Chief Complaint  Patient presents with   Coronary Artery Disease    History of Present Illness:    Mike Ray is a 71 y.o. male with a hx of: Coronary artery disease S/p inferolateral MI in 2017>> PCI to the RCA Myoview 2019: no ischemia Echo 9/19: EF 55-60 Hypertension Hyperlipidemia Gilbert's syndrome  The patient is here alone today.  He is headed to the beach this afternoon and looking forward to getting away for a few days.  He has felt well and denies any changes in his medications.  His functional capacity remains good with no cardiac-related limitations.  He swims 2 days/week without exertional symptoms.  He denies chest pain, chest pressure, shortness of breath, edema, or heart palpitations.  He has not required any nitroglycerin over the past year.  Past Medical History:  Diagnosis Date   Acute MI, inferolateral wall, initial episode of care ([facility]) 07/30/2015   Coronary artery disease    Elevated PSA    Dr Barnie Del   Gilbert's syndrome    History of heat stroke 2009   History of nephrolithiasis    Hyperlipidemia    NMR 06/2009: LDL 88(1345/912), HDL 40, TG 127. Framingham Study LDL goal =< 130    Hypertension    Osteoarthritis of left hip    hip replacement right and left    Past Surgical History:  Procedure Laterality Date   CARDIAC CATHETERIZATION     CARDIAC CATHETERIZATION N/A 07/30/2015   Procedure: Left Heart Cath and Coronary Angiography;  Surgeon: Sherren Mocha, MD;  Location: [facility];  Service: Cardiovascular;  Laterality: N/A;   CARDIAC CATHETERIZATION N/A 07/30/2015   Procedure: Coronary Stent Intervention;  Surgeon: Sherren Mocha, MD;  Location: [facility];  Service: Cardiovascular;   Laterality: N/A;  Distal RCA- Promus 3.50x16   COLONOSCOPY  2011   negative   INGUINAL HERNIA REPAIR Right 10/19/2017   Procedure: RIGHT  INGUINAL HERNIA REPAIR ERAS PATHWAY;  Surgeon: Erroll Luna, MD;  Location: [facility];  Service: General;  Laterality: Right;  Tap block   INSERTION OF MESH Right 10/19/2017   Procedure: INSERTION OF MESH;  Surgeon: Erroll Luna, MD;  Location: [facility];  Service: General;  Laterality: Right;   LITHOTRIPSY  2009   Dr.Duckett, [place]    TOTAL HIP ARTHROPLASTY  2008   left   TOTAL HIP ARTHROPLASTY  2004   right    Current Medications: Current Meds  Medication Sig   amLODipine (NORVASC) 10 MG tablet Take 1 tablet by mouth once daily   aspirin 81 MG tablet Take 81 mg by mouth daily.   atorvastatin (LIPITOR) 80 MG tablet TAKE 1 TABLET BY MOUTH ONCE DAILY IN THE EVENING   calcium carbonate (TUMS - DOSED IN MG ELEMENTAL CALCIUM) 500 MG chewable tablet Chew 1 tablet by mouth daily as needed.   Coenzyme Q10 (COQ10) 100 MG CAPS Take 1 capsule by mouth daily.   famotidine (PEPCID) 20 MG tablet Take 20 mg by mouth daily as needed for heartburn or indigestion.   ibuprofen (ADVIL) 200 MG tablet Take 600 mg by mouth daily as needed. Takes 3-4 times weekly   losartan (COZAAR)  100 MG tablet Take 1 tablet (100 mg total) by mouth daily.   metoprolol tartrate (LOPRESSOR) 25 MG tablet Take 1/2 (one-half) tablet by mouth twice daily   Multiple Vitamin (MULTIVITAMIN) tablet Take 1 tablet by mouth every other day.    sildenafil (REVATIO) 20 MG tablet Take 2-5 tablets by mouth as needed for erectile dysfunction. Do not use with nitroglycerin.   [DISCONTINUED] nitroGLYCERIN (NITROSTAT) 0.4 MG SL tablet DISSOLVE ONE TABLET UNDER THE TONGUE EVERY 5 MINUTES AS NEEDED FOR CHEST PAIN.  DO NOT EXCEED A TOTAL OF 3 DOSES IN 15 MINUTES     Allergies:   Patient has no known allergies.   Social History   Socioeconomic History   Marital  status: Married    Spouse name: Not on file   Number of children: 3   Years of education: Not on file   Highest education level: Not on file  Occupational History   Occupation: Chief Strategy Officer  Tobacco Use   Smoking status: Former    Packs/day: 1.00    Years: 32.00    Total pack years: 32.00    Types: Cigarettes    Start date: 33    Quit date: 01/26/2000    Years since quitting: 21.5   Smokeless tobacco: Never   Tobacco comments:    Pt stopped a few times.  Vaping Use   Vaping Use: Never used  Substance and Sexual Activity   Alcohol use: Yes    Comment: 15 beers/week   Drug use: No   Sexual activity: Yes    Birth control/protection: Other-see comments  Other Topics Concern   Not on file  Social History Narrative   Not on file   Social Determinants of Health   Financial Resource Strain: Low Risk  (04/24/2021)   Overall Financial Resource Strain (CARDIA)    Difficulty of Paying Living Expenses: Not hard at all  Food Insecurity: No Food Insecurity (04/24/2021)   Hunger Vital Sign    Worried About Running Out of Food in the Last Year: Never true    Ran Out of Food in the Last Year: Never true  Transportation Needs: No Transportation Needs (04/24/2021)   PRAPARE - Hydrologist (Medical): No    Lack of Transportation (Non-Medical): No  Physical Activity: Sufficiently Active (04/24/2021)   Exercise Vital Sign    Days of Exercise per Week: 5 days    Minutes of Exercise per Session: 50 min  Stress: No Stress Concern Present (04/24/2021)   [place]    Feeling of Stress : Not at all  Social Connections: Moderately Integrated (04/24/2021)   Social Connection and Isolation Panel [NHANES]    Frequency of Communication with Friends and Family: Three times a week    Frequency of Social Gatherings with Friends and Family: Three times a week    Attends Religious Services: More than 4 times per  year    Active Member of Clubs or Organizations: No    Attends Archivist Meetings: Never    Marital Status: Married     Family History: The patient's family history includes Coronary artery disease in his brother; Diabetes in his mother; Heart attack (age of onset: 27) in his father; Heart attack (age of onset: 16) in his brother; Hypertension in his brother and mother; Prostate cancer (age of onset: 8) in his brother. There is no history of Stroke, Colon cancer, Colon polyps, Esophageal cancer, Rectal cancer, or  Stomach cancer.  ROS:   Please see the history of present illness.    All other systems reviewed and are negative.  EKGs/Labs/Other Studies Reviewed:    The following studies were reviewed today: Cardiac Cath 2017: Prox Cx to Mid Cx lesion, 25% stenosed. Mid LAD lesion, 30% stenosed. 1st Diag lesion, 35% stenosed. There is mild left ventricular systolic dysfunction. Dist RCA lesion, 100% stenosed. Post intervention, there is a 0% residual stenosis.   1. Acute inferolateral STEMI secondary to total occlusion of the distal RCA, treated successfully with primary PCI using a drug-eluting stent   2. Mild nonobstructive stenosis of the LAD and left circumflex   3. Mild segmental contraction abnormality left ventricle with well-preserved overall LVEF   Recommendations: Post MI medical therapy. Aspirin and brilinta for at least 12 months. If hospital course is uncomplicated, patient could be transferred out of the ICU tomorrow and discharged home in 48 hours.  EKG:  EKG is ordered today.  The ekg ordered today demonstrates NSR 66 bpm, age-indeterminate inferior infarct, no change compared to old tracing from 12/31/19  Recent Labs: 02/09/2021: Hemoglobin 15.3; Platelets 234.0; TSH 1.86 08/12/2021: ALT 30; BUN 16; Creatinine, Ser 0.86; Potassium 4.5; Sodium 139  Recent Lipid Panel    Component Value Date/Time   CHOL 99 08/12/2021 0949   TRIG 162.0 (H) 08/12/2021 0949    HDL 34.70 (L) 08/12/2021 0949   CHOLHDL 3 08/12/2021 0949   VLDL 32.4 08/12/2021 0949   LDLCALC 32 08/12/2021 0949   LDLCALC 43 08/06/2019 0848   LDLDIRECT 117.0 07/10/2014 0916     Risk Assessment/Calculations:           Physical Exam:    VS:  BP 120/80   Pulse 66   Ht '5\' 11"'$  (1.803 m)   Wt 226 lb (102.5 kg)   SpO2 96%   BMI 31.52 kg/m     Wt Readings from Last 3 Encounters:  08/26/21 226 lb (102.5 kg)  08/12/21 226 lb (102.5 kg)  02/09/21 231 lb 9.6 oz (105.1 kg)     GEN:  Well nourished, well developed in no acute distress HEENT: Normal NECK: No JVD; No carotid bruits LYMPHATICS: No lymphadenopathy CARDIAC: RRR, no murmurs, rubs, gallops RESPIRATORY:  Clear to auscultation without rales, wheezing or rhonchi  ABDOMEN: Soft, non-tender, non-distended MUSCULOSKELETAL:  No edema; No deformity  SKIN: Warm and dry NEUROLOGIC:  Alert and oriented x 3 PSYCHIATRIC:  Normal affect   ASSESSMENT:    1. Coronary artery disease involving native coronary artery of native heart without angina pectoris   2. Essential hypertension   3. Mixed hyperlipidemia    PLAN:    In order of problems listed above:  Stable without symptoms of angina on the medical program that includes aspirin, amlodipine, metoprolol, and a high intensity statin drug with atorvastatin 80 mg daily.  His nitroglycerin is refilled.  I will see him back in 1 year.  There are no changes in his EKG.  He remains asymptomatic at a good functional capacity. Blood pressure is controlled on multidrug therapy with amlodipine, losartan, and metoprolol.  We discussed lifestyle modification with a goal towards reducing his sugar intake and achieving modest weight loss. Lipids reviewed with a cholesterol of 99, LDL 32.  Triglycerides are 162 and hemoglobin A1c is 6.3.  We discussed weight loss and reduce sugar intake.  He will continue his current medical program.      Medication Adjustments/Labs and Tests  Ordered: Current medicines are reviewed  at length with the patient today.  Concerns regarding medicines are outlined above.  Orders Placed This Encounter  Procedures   EKG 12-Lead   Meds ordered this encounter  Medications   nitroGLYCERIN (NITROSTAT) 0.4 MG SL tablet    Sig: Dissolve 1 tablet under the tongue every 5 minutes as needed for chest pain. Max of 3 doses, then 911    Dispense:  25 tablet    Refill:  3    Patient Instructions  Medication Instructions:  REFILLED Nitroglycerin *If you need a refill on your cardiac medications before your next appointment, please call your pharmacy*   Lab Work: NONE If you have labs (blood work) drawn today and your tests are completely normal, you will receive your results only by: [place] (if you have MyChart) OR A paper copy in the mail If you have any lab test that is abnormal or we need to change your treatment, we will call you to review the results.   Testing/Procedures: NONE   Follow-Up: At [facility], you and your health needs are our priority.  As part of our continuing mission to provide you with exceptional heart care, we have created designated Provider Care Teams.  These Care Teams include your primary Cardiologist (physician) and Advanced Practice Providers (APPs -  Physician Assistants and Nurse Practitioners) who all work together to provide you with the care you need, when you need it.  We recommend signing up for the patient portal called "MyChart".  Sign up information is provided on this After Visit Summary.  MyChart is used to connect with patients for Virtual Visits (Telemedicine).  Patients are able to view lab/test results, encounter notes, upcoming appointments, etc.  Non-urgent messages can be sent to your provider as well.   To learn more about what you can do with MyChart, go to [email or web address].    Your next appointment:   1 year(s)  The format for your next appointment:   In  Person  Provider:   Sherren Mocha, MD      Important Information About Sugar         Signed, Sherren Mocha, MD  08/26/2021 1:17 PM    [place]

## 2021-09-06 ENCOUNTER — Encounter: Payer: Self-pay | Admitting: Internal Medicine

## 2021-09-09 ENCOUNTER — Encounter: Payer: Self-pay | Admitting: Emergency Medicine

## 2021-09-09 ENCOUNTER — Ambulatory Visit (INDEPENDENT_AMBULATORY_CARE_PROVIDER_SITE_OTHER): Payer: Medicare Other | Admitting: Emergency Medicine

## 2021-09-09 VITALS — BP 124/82 | HR 68 | Temp 97.9°F | Ht 71.0 in | Wt 225.1 lb

## 2021-09-09 DIAGNOSIS — R21 Rash and other nonspecific skin eruption: Secondary | ICD-10-CM | POA: Diagnosis not present

## 2021-09-09 DIAGNOSIS — I2510 Atherosclerotic heart disease of native coronary artery without angina pectoris: Secondary | ICD-10-CM | POA: Diagnosis not present

## 2021-09-09 DIAGNOSIS — I776 Arteritis, unspecified: Secondary | ICD-10-CM | POA: Diagnosis not present

## 2021-09-09 LAB — CBC WITH DIFFERENTIAL/PLATELET
Basophils Absolute: 0 10*3/uL (ref 0.0–0.1)
Basophils Relative: 0.9 % (ref 0.0–3.0)
Eosinophils Absolute: 0.1 10*3/uL (ref 0.0–0.7)
Eosinophils Relative: 2.8 % (ref 0.0–5.0)
HCT: 44.5 % (ref 39.0–52.0)
Hemoglobin: 15.2 g/dL (ref 13.0–17.0)
Lymphocytes Relative: 30.7 % (ref 12.0–46.0)
Lymphs Abs: 1.6 10*3/uL (ref 0.7–4.0)
MCHC: 34.2 g/dL (ref 30.0–36.0)
MCV: 95.4 fl (ref 78.0–100.0)
Monocytes Absolute: 0.4 10*3/uL (ref 0.1–1.0)
Monocytes Relative: 8.3 % (ref 3.0–12.0)
Neutro Abs: 3 10*3/uL (ref 1.4–7.7)
Neutrophils Relative %: 57.3 % (ref 43.0–77.0)
Platelets: 227 10*3/uL (ref 150.0–400.0)
RBC: 4.67 Mil/uL (ref 4.22–5.81)
RDW: 13.1 % (ref 11.5–15.5)
WBC: 5.2 10*3/uL (ref 4.0–10.5)

## 2021-09-09 LAB — COMPREHENSIVE METABOLIC PANEL
ALT: 32 U/L (ref 0–53)
AST: 26 U/L (ref 0–37)
Albumin: 4.8 g/dL (ref 3.5–5.2)
Alkaline Phosphatase: 52 U/L (ref 39–117)
BUN: 15 mg/dL (ref 6–23)
CO2: 28 mEq/L (ref 19–32)
Calcium: 10 mg/dL (ref 8.4–10.5)
Chloride: 103 mEq/L (ref 96–112)
Creatinine, Ser: 0.88 mg/dL (ref 0.40–1.50)
GFR: 86.63 mL/min (ref >60.00)
Glucose, Bld: 123 mg/dL — ABNORMAL HIGH (ref 70–99)
Potassium: 4.3 mEq/L (ref 3.5–5.1)
Sodium: 140 mEq/L (ref 135–145)
Total Bilirubin: 1.5 mg/dL — ABNORMAL HIGH (ref 0.2–1.2)
Total Protein: 7.3 g/dL (ref 6.0–8.3)

## 2021-09-09 LAB — SEDIMENTATION RATE: Sed Rate: 3 mm/hr (ref 0–20)

## 2021-09-09 NOTE — Patient Instructions (Signed)
Health Maintenance After Age 71 After age 71, you are at a higher risk for certain long-term diseases and infections as well as injuries from falls. Falls are a major cause of broken bones and head injuries in people who are older than age 71. Getting regular preventive care can help to keep you healthy and well. Preventive care includes getting regular testing and making lifestyle changes as recommended by your health care provider. Talk with your health care provider about: Which screenings and tests you should have. A screening is a test that checks for a disease when you have no symptoms. A diet and exercise plan that is right for you. What should I know about screenings and tests to prevent falls? Screening and testing are the best ways to find a health problem early. Early diagnosis and treatment give you the best chance of managing medical conditions that are common after age 71. Certain conditions and lifestyle choices may make you more likely to have a fall. Your health care provider may recommend: Regular vision checks. Poor vision and conditions such as cataracts can make you more likely to have a fall. If you wear glasses, make sure to get your prescription updated if your vision changes. Medicine review. Work with your health care provider to regularly review all of the medicines you are taking, including over-the-counter medicines. Ask your health care provider about any side effects that may make you more likely to have a fall. Tell your health care provider if any medicines that you take make you feel dizzy or sleepy. Strength and balance checks. Your health care provider may recommend certain tests to check your strength and balance while standing, walking, or changing positions. Foot health exam. Foot pain and numbness, as well as not wearing proper footwear, can make you more likely to have a fall. Screenings, including: Osteoporosis screening. Osteoporosis is a condition that causes  the bones to get weaker and break more easily. Blood pressure screening. Blood pressure changes and medicines to control blood pressure can make you feel dizzy. Depression screening. You may be more likely to have a fall if you have a fear of falling, feel depressed, or feel unable to do activities that you used to do. Alcohol use screening. Using too much alcohol can affect your balance and may make you more likely to have a fall. Follow these instructions at home: Lifestyle Do not drink alcohol if: Your health care provider tells you not to drink. If you drink alcohol: Limit how much you have to: 0-1 drink a day for women. 0-2 drinks a day for men. Know how much alcohol is in your drink. In the [place] one drink equals one 12 oz bottle of beer (355 mL), one 5 oz glass of wine (148 mL), or one 1 oz glass of hard liquor (44 mL). Do not use any products that contain nicotine or tobacco. These products include cigarettes, chewing tobacco, and vaping devices, such as e-cigarettes. If you need help quitting, ask your health care provider. Activity  Follow a regular exercise program to stay fit. This will help you maintain your balance. Ask your health care provider what types of exercise are appropriate for you. If you need a cane or walker, use it as recommended by your health care provider. Wear supportive shoes that have nonskid soles. Safety  Remove any tripping hazards, such as rugs, cords, and clutter. Install safety equipment such as grab bars in bathrooms and safety rails on stairs. Keep rooms and walkways   well-lit. General instructions Talk with your health care provider about your risks for falling. Tell your health care provider if: You fall. Be sure to tell your health care provider about all falls, even ones that seem minor. You feel dizzy, tiredness (fatigue), or off-balance. Take over-the-counter and prescription medicines only as told by your health care provider. These include  supplements. Eat a healthy diet and maintain a healthy weight. A healthy diet includes low-fat dairy products, low-fat (lean) meats, and fiber from whole grains, beans, and lots of fruits and vegetables. Stay current with your vaccines. Schedule regular health, dental, and eye exams. Summary Having a healthy lifestyle and getting preventive care can help to protect your health and wellness after age 71. Screening and testing are the best way to find a health problem early and help you avoid having a fall. Early diagnosis and treatment give you the best chance for managing medical conditions that are more common for people who are older than age 71. Falls are a major cause of broken bones and head injuries in people who are older than age 71. Take precautions to prevent a fall at home. Work with your health care provider to learn what changes you can make to improve your health and wellness and to prevent falls. This information is not intended to replace advice given to you by your health care provider. Make sure you discuss any questions you have with your health care provider. Document Revised: 06/02/2020 Document Reviewed: 06/02/2020 Elsevier Patient Education  2023 [employer]  

## 2021-09-09 NOTE — Progress Notes (Signed)
Mike Ray 71 y.o.   Chief Complaint  Patient presents with   Skin Discoloration    Both legs , on the calf's and on top of each foot, noticed it on sunday Also on chest( thinks so)    HISTORY OF PRESENT ILLNESS: Acute problem visit today.  Patient of Dr. Billey Gosling. This is a 71 y.o. male complaining of non-itchy rash to lower extremities that started last week and.  It was much worse last Sunday.  Progressively getting better. Patient has history of hypertension, dyslipidemia, and coronary artery disease. On amlodipine and atorvastatin among other things.  Also takes daily baby aspirin. Denies fever or chills.  Denies weight loss.  Denies any other associated symptoms. His own research showed the possibility of petechiae. Denies pain to the extremities. No other complaints or medical concerns today.  HPI   Prior to Admission medications   Medication Sig Start Date End Date Taking? Authorizing Provider  amLODipine (NORVASC) 10 MG tablet Take 1 tablet by mouth once daily 06/02/21  Yes Burns, Claudina Lick, MD  aspirin 81 MG tablet Take 81 mg by mouth daily.   Yes [provider]  atorvastatin (LIPITOR) 80 MG tablet TAKE 1 TABLET BY MOUTH ONCE DAILY IN THE EVENING 06/15/21  Yes Burns, Claudina Lick, MD  calcium carbonate (TUMS - DOSED IN MG ELEMENTAL CALCIUM) 500 MG chewable tablet Chew 1 tablet by mouth daily as needed.   Yes [provider]  Coenzyme Q10 (COQ10) 100 MG CAPS Take 1 capsule by mouth daily.   Yes [provider]  famotidine (PEPCID) 20 MG tablet Take 20 mg by mouth daily as needed for heartburn or indigestion.   Yes [provider]  ibuprofen (ADVIL) 200 MG tablet Take 600 mg by mouth daily as needed. Takes 3-4 times weekly   Yes [provider]  losartan (COZAAR) 100 MG tablet Take 1 tablet (100 mg total) by mouth daily. 07/29/21  Yes Sherren Mocha, MD  metoprolol tartrate (LOPRESSOR) 25 MG tablet Take 1/2 (one-half) tablet by  mouth twice daily 08/18/21  Yes Burns, Claudina Lick, MD  Multiple Vitamin (MULTIVITAMIN) tablet Take 1 tablet by mouth every other day.    Yes [provider]  nitroGLYCERIN (NITROSTAT) 0.4 MG SL tablet Dissolve 1 tablet under the tongue every 5 minutes as needed for chest pain. Max of 3 doses, then 911 08/26/21  Yes Sherren Mocha, MD  sildenafil (REVATIO) 20 MG tablet Take 2-5 tablets by mouth as needed for erectile dysfunction. Do not use with nitroglycerin. 06/10/21  Yes Sherren Mocha, MD    No Known Allergies  Patient Active Problem List   Diagnosis Date Noted   Skin cancer 07/24/2018   Non-recurrent unilateral inguinal hernia without obstruction or gangrene 09/12/2017   Prediabetes 07/04/2016   CAD (coronary artery disease) 01/04/2016   History of ST elevation myocardial infarction (STEMI) 01/04/2016   NEPHROLITHIASIS, HX OF 10/22/2008   CHOLELITHIASIS 11/08/2007   Essential hypertension 10/17/2007   HYPERLIPIDEMIA 12/13/2006   ELEVATED PROSTATE SPECIFIC ANTIGEN 09/16/2006    Past Medical History:  Diagnosis Date   Acute MI, inferolateral wall, initial episode of care ([place]) 07/30/2015   Coronary artery disease    Elevated PSA    Dr Barnie Del   Gilbert's syndrome    History of heat stroke 2009   History of nephrolithiasis    Hyperlipidemia    NMR 06/2009: LDL 88(1345/912), HDL 40, TG 127. Framingham Study LDL goal =< 130    Hypertension  Osteoarthritis of left hip    hip replacement right and left    Past Surgical History:  Procedure Laterality Date   CARDIAC CATHETERIZATION     CARDIAC CATHETERIZATION N/A 07/30/2015   Procedure: Left Heart Cath and Coronary Angiography;  Surgeon: Sherren Mocha, MD;  Location: [facility];  Service: Cardiovascular;  Laterality: N/A;   CARDIAC CATHETERIZATION N/A 07/30/2015   Procedure: Coronary Stent Intervention;  Surgeon: Sherren Mocha, MD;  Location: [facility];  Service: Cardiovascular;  Laterality: N/A;  Distal RCA-  Promus 3.50x16   COLONOSCOPY  2011   negative   INGUINAL HERNIA REPAIR Right 10/19/2017   Procedure: RIGHT  INGUINAL HERNIA REPAIR ERAS PATHWAY;  Surgeon: Erroll Luna, MD;  Location: [place] [facility];  Service: General;  Laterality: Right;  Tap block   INSERTION OF MESH Right 10/19/2017   Procedure: INSERTION OF MESH;  Surgeon: Erroll Luna, MD;  Location: [facility];  Service: General;  Laterality: Right;   LITHOTRIPSY  2009   Dr.Duckett, [place]    TOTAL HIP ARTHROPLASTY  2008   left   TOTAL HIP ARTHROPLASTY  2004   right    Social History   Socioeconomic History   Marital status: Married    Spouse name: Not on file   Number of children: 3   Years of education: Not on file   Highest education level: Not on file  Occupational History   Occupation: Chief Strategy Officer  Tobacco Use   Smoking status: Former    Packs/day: 1.00    Years: 32.00    Total pack years: 32.00    Types: Cigarettes    Start date: 75    Quit date: 01/26/2000    Years since quitting: 21.6   Smokeless tobacco: Never   Tobacco comments:    Pt stopped a few times.  Vaping Use   Vaping Use: Never used  Substance and Sexual Activity   Alcohol use: Yes    Comment: 15 beers/week   Drug use: No   Sexual activity: Yes    Birth control/protection: Other-see comments  Other Topics Concern   Not on file  Social History Narrative   Not on file   Social Determinants of Health   Financial Resource Strain: Low Risk  (04/24/2021)   Overall Financial Resource Strain (CARDIA)    Difficulty of Paying Living Expenses: Not hard at all  Food Insecurity: No Food Insecurity (04/24/2021)   Hunger Vital Sign    Worried About Running Out of Food in the Last Year: Never true    Ran Out of Food in the Last Year: Never true  Transportation Needs: No Transportation Needs (04/24/2021)   PRAPARE - Hydrologist (Medical): No    Lack of Transportation (Non-Medical): No   Physical Activity: Sufficiently Active (04/24/2021)   Exercise Vital Sign    Days of Exercise per Week: 5 days    Minutes of Exercise per Session: 50 min  Stress: No Stress Concern Present (04/24/2021)   [place]    Feeling of Stress : Not at all  Social Connections: Moderately Integrated (04/24/2021)   Social Connection and Isolation Panel [NHANES]    Frequency of Communication with Friends and Family: Three times a week    Frequency of Social Gatherings with Friends and Family: Three times a week    Attends Religious Services: More than 4 times per year    Active Member  of Clubs or Organizations: No    Attends Archivist Meetings: Never    Marital Status: Married  Human resources officer Violence: Not At Risk (04/24/2021)   Humiliation, Afraid, Rape, and Kick questionnaire    Fear of Current or Ex-Partner: No    Emotionally Abused: No    Physically Abused: No    Sexually Abused: No    Family History  Problem Relation Age of Onset   Diabetes Mother    Hypertension Mother    Heart attack Father 43       smoker   Prostate cancer Brother 59       CAD; stents @ 84   Hypertension Brother    Heart attack Brother 49   Coronary artery disease Brother        stents late 5s   Stroke Neg Hx    Colon cancer Neg Hx    Colon polyps Neg Hx    Esophageal cancer Neg Hx    Rectal cancer Neg Hx    Stomach cancer Neg Hx      Review of Systems  Constitutional: Negative.  Negative for chills and fever.  HENT: Negative.  Negative for congestion and sore throat.   Respiratory: Negative.  Negative for cough and shortness of breath.   Cardiovascular: Negative.  Negative for chest pain and palpitations.  Gastrointestinal:  Negative for abdominal pain, diarrhea, nausea and vomiting.  Genitourinary: Negative.   Skin:  Positive for rash. Negative for itching.  Neurological: Negative.  Negative for dizziness and headaches.   All other systems reviewed and are negative.  Today's Vitals   09/09/21 0826  BP: 124/82  Pulse: 68  Temp: 97.9 F (36.6 C)  TempSrc: Oral  SpO2: 96%  Weight: 225 lb 2 oz (102.1 kg)  Height: '5\' 11"'$  (1.803 m)   Body mass index is 31.4 kg/m.   Physical Exam Constitutional:      Appearance: Normal appearance.  HENT:     Head: Normocephalic.  Eyes:     Extraocular Movements: Extraocular movements intact.  Cardiovascular:     Rate and Rhythm: Normal rate.  Pulmonary:     Effort: Pulmonary effort is normal.  Musculoskeletal:        General: Normal range of motion.  Skin:    General: Skin is warm and dry.     Comments: Lower extremities: Flat, nonpalpable, nontender small erythematous macules.  No nodules or blisters.  No petechial rash.  Most likely vasculitic.  No ecchymosis. Neurovascularly intact.  Neurological:     Mental Status: He is alert and oriented to person, place, and time.  Psychiatric:        Mood and Affect: Mood normal.        Behavior: Behavior normal.      ASSESSMENT & PLAN: Problem List Items Addressed This Visit       Musculoskeletal and Integument   Rash and nonspecific skin eruption - Primary    Rash much improved today compared to 4 days ago according to patient. Suspected self-limiting vasculitic process.  No petechial rash. No systemic symptoms.  No red flag signs or symptoms. Patient with history of hypertension, dyslipidemia, and coronary artery disease, most likely has some degree of peripheral vascular disease. No flulike symptoms or infectious disease type process. No new medications to suspect medication reaction syndrome. Blood work done today. Needs to follow-up with PCP. Also advised to contact the office if no better or worse during the next several days.  Other Visit Diagnoses     Vasculitis ([place])       Relevant Orders   Sedimentation rate   ANCA Profile (RDL)   CBC with Differential/Platelet   Comprehensive  metabolic panel   ANA,IFA RA Diag Pnl w/rflx Tit/Patn      Patient Instructions  Health Maintenance After Age 52 After age 83, you are at a higher risk for certain long-term diseases and infections as well as injuries from falls. Falls are a major cause of broken bones and head injuries in people who are older than age 2. Getting regular preventive care can help to keep you healthy and well. Preventive care includes getting regular testing and making lifestyle changes as recommended by your health care provider. Talk with your health care provider about: Which screenings and tests you should have. A screening is a test that checks for a disease when you have no symptoms. A diet and exercise plan that is right for you. What should I know about screenings and tests to prevent falls? Screening and testing are the best ways to find a health problem early. Early diagnosis and treatment give you the best chance of managing medical conditions that are common after age 1. Certain conditions and lifestyle choices may make you more likely to have a fall. Your health care provider may recommend: Regular vision checks. Poor vision and conditions such as cataracts can make you more likely to have a fall. If you wear glasses, make sure to get your prescription updated if your vision changes. Medicine review. Work with your health care provider to regularly review all of the medicines you are taking, including over-the-counter medicines. Ask your health care provider about any side effects that may make you more likely to have a fall. Tell your health care provider if any medicines that you take make you feel dizzy or sleepy. Strength and balance checks. Your health care provider may recommend certain tests to check your strength and balance while standing, walking, or changing positions. Foot health exam. Foot pain and numbness, as well as not wearing proper footwear, can make you more likely to have a  fall. Screenings, including: Osteoporosis screening. Osteoporosis is a condition that causes the bones to get weaker and break more easily. Blood pressure screening. Blood pressure changes and medicines to control blood pressure can make you feel dizzy. Depression screening. You may be more likely to have a fall if you have a fear of falling, feel depressed, or feel unable to do activities that you used to do. Alcohol use screening. Using too much alcohol can affect your balance and may make you more likely to have a fall. Follow these instructions at home: Lifestyle Do not drink alcohol if: Your health care provider tells you not to drink. If you drink alcohol: Limit how much you have to: 0-1 drink a day for women. 0-2 drinks a day for men. Know how much alcohol is in your drink. In the [place] one drink equals one 12 oz bottle of beer (355 mL), one 5 oz glass of wine (148 mL), or one 1 oz glass of hard liquor (44 mL). Do not use any products that contain nicotine or tobacco. These products include cigarettes, chewing tobacco, and vaping devices, such as e-cigarettes. If you need help quitting, ask your health care provider. Activity  Follow a regular exercise program to stay fit. This will help you maintain your balance. Ask your health care provider what types of exercise are appropriate for you.  If you need a cane or walker, use it as recommended by your health care provider. Wear supportive shoes that have nonskid soles. Safety  Remove any tripping hazards, such as rugs, cords, and clutter. Install safety equipment such as grab bars in bathrooms and safety rails on stairs. Keep rooms and walkways well-lit. General instructions Talk with your health care provider about your risks for falling. Tell your health care provider if: You fall. Be sure to tell your health care provider about all falls, even ones that seem minor. You feel dizzy, tiredness (fatigue), or off-balance. Take  over-the-counter and prescription medicines only as told by your health care provider. These include supplements. Eat a healthy diet and maintain a healthy weight. A healthy diet includes low-fat dairy products, low-fat (lean) meats, and fiber from whole grains, beans, and lots of fruits and vegetables. Stay current with your vaccines. Schedule regular health, dental, and eye exams. Summary Having a healthy lifestyle and getting preventive care can help to protect your health and wellness after age 26. Screening and testing are the best way to find a health problem early and help you avoid having a fall. Early diagnosis and treatment give you the best chance for managing medical conditions that are more common for people who are older than age 29. Falls are a major cause of broken bones and head injuries in people who are older than age 69. Take precautions to prevent a fall at home. Work with your health care provider to learn what changes you can make to improve your health and wellness and to prevent falls. This information is not intended to replace advice given to you by your health care provider. Make sure you discuss any questions you have with your health care provider. Document Revised: 06/02/2020 Document Reviewed: 06/02/2020 Elsevier Patient Education  [place], MD [facility]

## 2021-09-09 NOTE — Assessment & Plan Note (Signed)
Rash much improved today compared to 4 days ago according to patient. Suspected self-limiting vasculitic process.  No petechial rash. No systemic symptoms.  No red flag signs or symptoms. Patient with history of hypertension, dyslipidemia, and coronary artery disease, most likely has some degree of peripheral vascular disease. No flulike symptoms or infectious disease type process. No new medications to suspect medication reaction syndrome. Blood work done today. Needs to follow-up with PCP. Also advised to contact the office if no better or worse during the next several days.

## 2021-09-13 ENCOUNTER — Other Ambulatory Visit: Payer: Self-pay | Admitting: Internal Medicine

## 2021-09-15 ENCOUNTER — Encounter: Payer: Self-pay | Admitting: Emergency Medicine

## 2021-09-15 LAB — ANA,IFA RA DIAG PNL W/RFLX TIT/PATN
Anti Nuclear Antibody (ANA): NEGATIVE
Cyclic Citrullin Peptide Ab: <16 UNITS
Rheumatoid fact SerPl-aCnc: <14 IU/mL (ref <14)

## 2021-09-16 ENCOUNTER — Encounter: Payer: Self-pay | Admitting: Internal Medicine

## 2021-09-16 NOTE — Telephone Encounter (Signed)
Normal results.  Possible cause is a transient vasculitis as discussed.  Recommend to follow-up with PCP Dr. Billey Gosling.  Thanks.

## 2021-09-17 LAB — ANCA PROFILE (RDL)
ANCA by IFA (RDL): NEGATIVE
Anti-MPO Ab (RDL): <20 Units (ref <20)
Anti-PR-3 Ab (RDL): <20 Units (ref <20)

## 2021-09-25 ENCOUNTER — Other Ambulatory Visit: Payer: Self-pay | Admitting: Cardiovascular Disease

## 2021-10-08 ENCOUNTER — Encounter: Payer: Self-pay | Admitting: Cardiovascular Disease

## 2021-10-08 ENCOUNTER — Encounter: Payer: Self-pay | Admitting: Internal Medicine

## 2021-11-13 ENCOUNTER — Encounter: Payer: Self-pay | Admitting: Internal Medicine

## 2021-11-13 DIAGNOSIS — Z23 Encounter for immunization: Secondary | ICD-10-CM | POA: Diagnosis not present

## 2021-11-17 ENCOUNTER — Other Ambulatory Visit: Payer: Self-pay | Admitting: Internal Medicine

## 2021-11-24 DIAGNOSIS — E669 Obesity, unspecified: Secondary | ICD-10-CM | POA: Diagnosis not present

## 2021-11-24 DIAGNOSIS — Z85828 Personal history of other malignant neoplasm of skin: Secondary | ICD-10-CM | POA: Diagnosis not present

## 2021-11-24 DIAGNOSIS — L821 Other seborrheic keratosis: Secondary | ICD-10-CM | POA: Diagnosis not present

## 2021-11-24 DIAGNOSIS — L570 Actinic keratosis: Secondary | ICD-10-CM | POA: Diagnosis not present

## 2021-11-24 DIAGNOSIS — L578 Other skin changes due to chronic exposure to nonionizing radiation: Secondary | ICD-10-CM | POA: Diagnosis not present

## 2021-11-24 DIAGNOSIS — D1801 Hemangioma of skin and subcutaneous tissue: Secondary | ICD-10-CM | POA: Diagnosis not present

## 2021-11-24 DIAGNOSIS — I781 Nevus, non-neoplastic: Secondary | ICD-10-CM | POA: Diagnosis not present

## 2021-12-06 ENCOUNTER — Other Ambulatory Visit: Payer: Self-pay | Admitting: Internal Medicine

## 2021-12-21 ENCOUNTER — Other Ambulatory Visit: Payer: Self-pay | Admitting: Internal Medicine

## 2021-12-22 DIAGNOSIS — Z23 Encounter for immunization: Secondary | ICD-10-CM | POA: Diagnosis not present

## 2021-12-29 DIAGNOSIS — H2513 Age-related nuclear cataract, bilateral: Secondary | ICD-10-CM | POA: Diagnosis not present

## 2022-02-22 ENCOUNTER — Other Ambulatory Visit: Payer: Self-pay | Admitting: Internal Medicine

## 2022-03-01 NOTE — Patient Instructions (Addendum)
      Blood work was ordered.   The lab is on the first floor.    Medications changes include :   none       Return in about 6 months (around 08/31/2022) for follow up.

## 2022-03-01 NOTE — Progress Notes (Unsigned)
      Subjective:    Patient ID: Mike Ray, male    DOB: 1950-06-20, 72 y.o.   MRN: [id number]     HPI Joe is here for follow up of his chronic medical problems, including CAD, htn, hld, prediabetes  Swims 2/wk  Medications and allergies reviewed with patient and updated if appropriate.  Current Outpatient Medications on File Prior to Visit  Medication Sig Dispense Refill   amLODipine (NORVASC) 10 MG tablet Take 1 tablet by mouth once daily 90 tablet 0   aspirin 81 MG tablet Take 81 mg by mouth daily.     atorvastatin (LIPITOR) 80 MG tablet TAKE 1 TABLET BY MOUTH ONCE DAILY IN THE EVENING 90 tablet 0   calcium carbonate (TUMS - DOSED IN MG ELEMENTAL CALCIUM) 500 MG chewable tablet Chew 1 tablet by mouth daily as needed.     Coenzyme Q10 (COQ10) 100 MG CAPS Take 1 capsule by mouth daily.     famotidine (PEPCID) 20 MG tablet Take 20 mg by mouth daily as needed for heartburn or indigestion.     ibuprofen (ADVIL) 200 MG tablet Take 600 mg by mouth daily as needed. Takes 3-4 times weekly     losartan (COZAAR) 100 MG tablet TAKE 1 TABLET BY MOUTH EVERY DAY 90 tablet 3   metoprolol tartrate (LOPRESSOR) 25 MG tablet Take 1/2 (one-half) tablet by mouth twice daily 90 tablet 0   Multiple Vitamin (MULTIVITAMIN) tablet Take 1 tablet by mouth every other day.      nitroGLYCERIN (NITROSTAT) 0.4 MG SL tablet Dissolve 1 tablet under the tongue every 5 minutes as needed for chest pain. Max of 3 doses, then 911 25 tablet 3   sildenafil (REVATIO) 20 MG tablet Take 2-5 tablets by mouth as needed for erectile dysfunction. Do not use with nitroglycerin. 90 tablet 3   No current facility-administered medications on file prior to visit.     Review of Systems     Objective:  There were no vitals filed for this visit. BP Readings from Last 3 Encounters:  09/09/21 124/82  08/26/21 120/80  08/12/21 120/76   Wt Readings from Last 3 Encounters:  09/09/21 225 lb 2 oz (102.1 kg)  08/26/21  226 lb (102.5 kg)  08/12/21 226 lb (102.5 kg)   There is no height or weight on file to calculate BMI.    Physical Exam     Lab Results  Component Value Date   WBC 5.2 09/09/2021   HGB 15.2 09/09/2021   HCT 44.5 09/09/2021   PLT 227.0 09/09/2021   GLUCOSE 123 (H) 09/09/2021   CHOL 99 08/12/2021   TRIG 162.0 (H) 08/12/2021   HDL 34.70 (L) 08/12/2021   LDLDIRECT 117.0 07/10/2014   LDLCALC 32 08/12/2021   ALT 32 09/09/2021   AST 26 09/09/2021   NA 140 09/09/2021   K 4.3 09/09/2021   CL 103 09/09/2021   CREATININE 0.88 09/09/2021   BUN 15 09/09/2021   CO2 28 09/09/2021   TSH 1.86 02/09/2021   PSA 2.37 08/06/2019   INR 1.20 07/30/2015   HGBA1C 6.3 08/12/2021     Assessment & Plan:    See Problem List for Assessment and Plan of chronic medical problems.

## 2022-03-02 ENCOUNTER — Encounter: Payer: Self-pay | Admitting: Internal Medicine

## 2022-03-02 ENCOUNTER — Ambulatory Visit (INDEPENDENT_AMBULATORY_CARE_PROVIDER_SITE_OTHER): Payer: Medicare Other | Admitting: Internal Medicine

## 2022-03-02 VITALS — BP 134/76 | HR 80 | Temp 98.4°F | Ht 71.0 in | Wt 230.0 lb

## 2022-03-02 DIAGNOSIS — E782 Mixed hyperlipidemia: Secondary | ICD-10-CM | POA: Diagnosis not present

## 2022-03-02 DIAGNOSIS — I2510 Atherosclerotic heart disease of native coronary artery without angina pectoris: Secondary | ICD-10-CM | POA: Diagnosis not present

## 2022-03-02 DIAGNOSIS — I10 Essential (primary) hypertension: Secondary | ICD-10-CM

## 2022-03-02 DIAGNOSIS — R7303 Prediabetes: Secondary | ICD-10-CM | POA: Diagnosis not present

## 2022-03-02 LAB — COMPREHENSIVE METABOLIC PANEL
ALT: 30 U/L (ref 0–53)
AST: 23 U/L (ref 0–37)
Albumin: 4.9 g/dL (ref 3.5–5.2)
Alkaline Phosphatase: 54 U/L (ref 39–117)
BUN: 15 mg/dL (ref 6–23)
CO2: 24 mEq/L (ref 19–32)
Calcium: 9.8 mg/dL (ref 8.4–10.5)
Chloride: 104 mEq/L (ref 96–112)
Creatinine, Ser: 0.89 mg/dL (ref 0.40–1.50)
GFR: 86.04 mL/min (ref >60.00)
Glucose, Bld: 124 mg/dL — ABNORMAL HIGH (ref 70–99)
Potassium: 4.3 mEq/L (ref 3.5–5.1)
Sodium: 140 mEq/L (ref 135–145)
Total Bilirubin: 1.3 mg/dL — ABNORMAL HIGH (ref 0.2–1.2)
Total Protein: 7.5 g/dL (ref 6.0–8.3)

## 2022-03-02 LAB — LIPID PANEL
Cholesterol: 104 mg/dL (ref 0–200)
HDL: 32.5 mg/dL — ABNORMAL LOW (ref >39.00)
NonHDL: 71.84
Total CHOL/HDL Ratio: 3
Triglycerides: 211 mg/dL — ABNORMAL HIGH (ref 0.0–149.0)
VLDL: 42.2 mg/dL — ABNORMAL HIGH (ref 0.0–40.0)

## 2022-03-02 LAB — LDL CHOLESTEROL, DIRECT: Direct LDL: 49 mg/dL

## 2022-03-02 LAB — HEMOGLOBIN A1C: Hgb A1c MFr Bld: 6.2 % (ref 4.6–6.5)

## 2022-03-02 NOTE — Assessment & Plan Note (Signed)
Chronic Blood pressure well controlled CMP Continue amlodipine 10 mg daily, losartan 100 mg daily, metoprolol 12.5 mg twice daily

## 2022-03-02 NOTE — Assessment & Plan Note (Signed)
Chronic Regular exercise and healthy diet encouraged Check lipid panel  Continue atorvastatin 80 mg daily 

## 2022-03-02 NOTE — Assessment & Plan Note (Signed)
Chronic Check a1c Low sugar / carb diet Stressed regular exercise  

## 2022-03-02 NOTE — Assessment & Plan Note (Signed)
Chronic Follows with cardiology No symptoms consistent with angina Continue atorvastatin 80 mg daily, aspirin 81 mg daily Blood pressure well-controlled Continue regular exercise, healthy diet

## 2022-03-06 ENCOUNTER — Other Ambulatory Visit: Payer: Self-pay | Admitting: Internal Medicine

## 2022-03-27 ENCOUNTER — Other Ambulatory Visit: Payer: Self-pay | Admitting: Internal Medicine

## 2022-04-06 ENCOUNTER — Encounter: Payer: Self-pay | Admitting: Internal Medicine

## 2022-04-07 ENCOUNTER — Encounter: Payer: Self-pay | Admitting: Internal Medicine

## 2022-04-07 NOTE — Progress Notes (Unsigned)
    Subjective:    Patient ID: Mike Ray, male    DOB: Nov 22, 1950, 72 y.o.   MRN: [id number]      HPI Mike Ray is here for No chief complaint on file.   He is here for an acute visit for cold symptoms.  His symptoms started  He is experiencing   He has tried taking      Medications and allergies reviewed with patient and updated if appropriate.  Current Outpatient Medications on File Prior to Visit  Medication Sig Dispense Refill   amLODipine (NORVASC) 10 MG tablet Take 1 tablet by mouth once daily 90 tablet 2   aspirin 81 MG tablet Take 81 mg by mouth daily.     atorvastatin (LIPITOR) 80 MG tablet TAKE 1 TABLET BY MOUTH ONCE DAILY IN THE EVENING 90 tablet 0   calcium carbonate (TUMS - DOSED IN MG ELEMENTAL CALCIUM) 500 MG chewable tablet Chew 1 tablet by mouth daily as needed.     Coenzyme Q10 (COQ10) 100 MG CAPS Take 1 capsule by mouth daily.     famotidine (PEPCID) 20 MG tablet Take 20 mg by mouth daily as needed for heartburn or indigestion.     ibuprofen (ADVIL) 200 MG tablet Take 600 mg by mouth daily as needed. Takes 3-4 times weekly     losartan (COZAAR) 100 MG tablet TAKE 1 TABLET BY MOUTH EVERY DAY 90 tablet 3   metoprolol tartrate (LOPRESSOR) 25 MG tablet Take 1/2 (one-half) tablet by mouth twice daily 90 tablet 0   Multiple Vitamin (MULTIVITAMIN) tablet Take 1 tablet by mouth every other day.      nitroGLYCERIN (NITROSTAT) 0.4 MG SL tablet Dissolve 1 tablet under the tongue every 5 minutes as needed for chest pain. Max of 3 doses, then 911 25 tablet 3   sildenafil (REVATIO) 20 MG tablet Take 2-5 tablets by mouth as needed for erectile dysfunction. Do not use with nitroglycerin. 90 tablet 3   No current facility-administered medications on file prior to visit.    Review of Systems     Objective:  There were no vitals filed for this visit. BP Readings from Last 3 Encounters:  03/02/22 134/76  09/09/21 124/82  08/26/21 120/80   Wt Readings from  Last 3 Encounters:  03/02/22 230 lb (104.3 kg)  09/09/21 225 lb 2 oz (102.1 kg)  08/26/21 226 lb (102.5 kg)   There is no height or weight on file to calculate BMI.    Physical Exam         Assessment & Plan:    See Problem List for Assessment and Plan of chronic medical problems.

## 2022-04-08 ENCOUNTER — Encounter: Payer: Self-pay | Admitting: Internal Medicine

## 2022-04-08 ENCOUNTER — Ambulatory Visit (INDEPENDENT_AMBULATORY_CARE_PROVIDER_SITE_OTHER): Payer: Medicare Other | Admitting: Internal Medicine

## 2022-04-08 VITALS — BP 124/80 | HR 70 | Temp 97.9°F | Ht 71.0 in | Wt 223.0 lb

## 2022-04-08 DIAGNOSIS — I10 Essential (primary) hypertension: Secondary | ICD-10-CM

## 2022-04-08 DIAGNOSIS — J209 Acute bronchitis, unspecified: Secondary | ICD-10-CM | POA: Insufficient documentation

## 2022-04-08 MED ORDER — AMOXICILLIN-POT CLAVULANATE 875-125 MG PO TABS: 1.0000 | ORAL_TABLET | Freq: Two times a day (BID) | ORAL | 0 refills | Status: AC

## 2022-04-08 MED ORDER — PREDNISONE 20 MG PO TABS: 40.0000 mg | ORAL_TABLET | Freq: Every day | ORAL | 0 refills | Status: AC

## 2022-04-08 NOTE — Assessment & Plan Note (Signed)
Acute concern bacterial cause Start Augmentin 875-125 mg BID x 7 day, prednisone 40 mg daily x 3 days otc cold medications Rest, fluid Call if no improvement

## 2022-04-08 NOTE — Patient Instructions (Addendum)
      Medications changes include :   Augmentin twice a day for 1 week, prednisone 40 mg daily for 3 days     Return if symptoms worsen or fail to improve.

## 2022-04-08 NOTE — Assessment & Plan Note (Signed)
Chronic BP well controlled Continue amlodipine 10 mg daily, losartan 100 mg daily, metoprolol 12.5 mg bid

## 2022-04-20 ENCOUNTER — Telehealth: Payer: Self-pay

## 2022-04-20 NOTE — Telephone Encounter (Signed)
Called patient to schedule Medicare Annual Wellness Visit (AWV). Unable to reach patient.  *Line continues to be busy, will try again tomorrow.  Last date of AWV: 04/24/21  Please schedule an appointment at any time with [facility].    Norton Blizzard, [place] ([facility])  [facility] [phone]

## 2022-04-23 DIAGNOSIS — M7061 Trochanteric bursitis, right hip: Secondary | ICD-10-CM | POA: Diagnosis not present

## 2022-05-20 ENCOUNTER — Telehealth: Payer: Self-pay | Admitting: Internal Medicine

## 2022-05-20 NOTE — Telephone Encounter (Signed)
Called patient to schedule Medicare Annual Wellness Visit (AWV). Left message for patient to call back and schedule Medicare Annual Wellness Visit (AWV).  Last date of AWV: 04/24/2021  Please schedule an appointment at any time with [facility].  If any questions, please contact me at [phone].  Thank you ,  Bernice Cicero Care Guide [facility] AWV TEAM Direct Dial: [phone]    

## 2022-05-24 ENCOUNTER — Telehealth: Payer: Self-pay | Admitting: Internal Medicine

## 2022-05-24 NOTE — Telephone Encounter (Signed)
Called patient to schedule Medicare Annual Wellness Visit (AWV). Left message for patient to call back and schedule Medicare Annual Wellness Visit (AWV).  Last date of AWV: 04/25/2022  Please schedule an appointment at any time with [facility].  If any questions, please contact me at [phone].  Thank you ,   Randon Goldsmith Care Guide [facility] AWV TEAM Direct Dial: [phone]

## 2022-05-24 NOTE — Telephone Encounter (Signed)
Contacted Mike Ray to schedule their annual wellness visit. Appointment made for 06/02/2022.  [facility] Care Guide [facility] AWV TEAM Direct Dial: [phone]

## 2022-05-26 ENCOUNTER — Other Ambulatory Visit: Payer: Self-pay | Admitting: Internal Medicine

## 2022-06-02 ENCOUNTER — Ambulatory Visit (INDEPENDENT_AMBULATORY_CARE_PROVIDER_SITE_OTHER): Payer: Medicare Other

## 2022-06-02 VITALS — Ht 71.0 in | Wt 219.0 lb

## 2022-06-02 DIAGNOSIS — Z0000 Encounter for general adult medical examination without abnormal findings: Secondary | ICD-10-CM

## 2022-06-02 NOTE — Progress Notes (Signed)
I connected with  Mike Ray on 06/02/22 by a audio enabled telemedicine application and verified that I am speaking with the correct person using two identifiers.  Patient Location: Home  Provider Location: Office/Clinic  I discussed the limitations of evaluation and management by telemedicine. The patient expressed understanding and agreed to proceed.  Subjective:   Mike Ray is a 72 y.o. male who presents for Medicare Annual/Subsequent preventive examination.  Review of Systems     Cardiac Risk Factors include: advanced age (>47men, >7 women);dyslipidemia;hypertension;male gender;obesity (BMI >30kg/m2);family history of premature cardiovascular disease     Objective:    Today's Vitals   06/02/22 0848  Weight: 219 lb (99.3 kg)  Height: 5\' 11"  (1.803 m)  PainSc: 0-No pain   Body mass index is 30.54 kg/m.     06/02/2022    8:49 AM 04/24/2021    8:55 AM 03/26/2020    8:23 AM 01/11/2018    8:14 AM 10/19/2017    6:23 AM 10/14/2017   11:31 AM 01/10/2017    9:19 AM  Advanced Directives  Does Patient Have a Medical Advance Directive? Yes Yes Yes Yes Yes Yes Yes  Type of Estate agent of [place];Living will Healthcare Power of [place];Living will Healthcare Power of [place];Living will Healthcare Power of [place];Living will Living will Living will Healthcare Power of [place];Living will  Does patient want to make changes to medical advance directive?   No - Patient declined  No - Patient declined No - Patient declined   Copy of Healthcare Power of Attorney in Chart? No - copy requested No - copy requested No - copy requested No - copy requested No - copy requested No - copy requested No - copy requested    Current Medications (verified) Outpatient Encounter Medications as of 06/02/2022  Medication Sig   amLODipine (NORVASC) 10 MG tablet Take 1 tablet by mouth once daily   aspirin 81 MG tablet Take 81 mg by mouth daily.   atorvastatin  (LIPITOR) 80 MG tablet TAKE 1 TABLET BY MOUTH ONCE DAILY IN THE EVENING   calcium carbonate (TUMS - DOSED IN MG ELEMENTAL CALCIUM) 500 MG chewable tablet Chew 1 tablet by mouth daily as needed.   Coenzyme Q10 (COQ10) 100 MG CAPS Take 1 capsule by mouth daily.   famotidine (PEPCID) 20 MG tablet Take 20 mg by mouth daily as needed for heartburn or indigestion.   ibuprofen (ADVIL) 200 MG tablet Take 600 mg by mouth daily as needed. Takes 3-4 times weekly   losartan (COZAAR) 100 MG tablet TAKE 1 TABLET BY MOUTH EVERY DAY   metoprolol tartrate (LOPRESSOR) 25 MG tablet Take 1/2 (one-half) tablet by mouth twice daily   Multiple Vitamin (MULTIVITAMIN) tablet Take 1 tablet by mouth every other day.    nitroGLYCERIN (NITROSTAT) 0.4 MG SL tablet Dissolve 1 tablet under the tongue every 5 minutes as needed for chest pain. Max of 3 doses, then 911   sildenafil (REVATIO) 20 MG tablet Take 2-5 tablets by mouth as needed for erectile dysfunction. Do not use with nitroglycerin.   No facility-administered encounter medications on file as of 06/02/2022.    Allergies (verified) Patient has no known allergies.   History: Past Medical History:  Diagnosis Date   Acute MI, inferolateral wall, initial episode of care (HCC) 07/30/2015   Coronary artery disease    Elevated PSA    Dr Edwin Cap   Gilbert's syndrome    History of heat stroke 2009   History of  nephrolithiasis    Hyperlipidemia    NMR 06/2009: LDL 88(1345/912), HDL 40, TG 127. Framingham Study LDL goal =< 130    Hypertension    Osteoarthritis of left hip    hip replacement right and left   Past Surgical History:  Procedure Laterality Date   CARDIAC CATHETERIZATION     CARDIAC CATHETERIZATION N/A 07/30/2015   Procedure: Left Heart Cath and Coronary Angiography;  Surgeon: Tonny Bollman, MD;  Location: [facility] INVASIVE CV LAB;  Service: Cardiovascular;  Laterality: N/A;   CARDIAC CATHETERIZATION N/A 07/30/2015   Procedure: Coronary Stent Intervention;   Surgeon: Tonny Bollman, MD;  Location: [facility] INVASIVE CV LAB;  Service: Cardiovascular;  Laterality: N/A;  Distal RCA- Promus 3.50x16   COLONOSCOPY  2011   negative   INGUINAL HERNIA REPAIR Right 10/19/2017   Procedure: RIGHT  INGUINAL HERNIA REPAIR ERAS PATHWAY;  Surgeon: Harriette Bouillon, MD;  Location: [facility];  Service: General;  Laterality: Right;  Tap block   INSERTION OF MESH Right 10/19/2017   Procedure: INSERTION OF MESH;  Surgeon: Harriette Bouillon, MD;  Location: [facility];  Service: General;  Laterality: Right;   LITHOTRIPSY  2009   Dr.Duckett, [place]    TOTAL HIP ARTHROPLASTY  2008   left   TOTAL HIP ARTHROPLASTY  2004   right   Family History  Problem Relation Age of Onset   Diabetes Mother    Hypertension Mother    Heart attack Father 93       smoker   Prostate cancer Brother 19       CAD; stents @ 56   Hypertension Brother    Heart attack Brother 30   Coronary artery disease Brother        stents late 3s   Stroke Neg Hx    Colon cancer Neg Hx    Colon polyps Neg Hx    Esophageal cancer Neg Hx    Rectal cancer Neg Hx    Stomach cancer Neg Hx    Social History   Socioeconomic History   Marital status: Married    Spouse name: Not on file   Number of children: 3   Years of education: Not on file   Highest education level: Not on file  Occupational History   Occupation: Surveyor, minerals  Tobacco Use   Smoking status: Former    Packs/day: 1.00    Years: 32.00    Additional pack years: 0.00    Total pack years: 32.00    Types: Cigarettes    Start date: 7    Quit date: 01/26/2000    Years since quitting: 22.3   Smokeless tobacco: Never   Tobacco comments:    Pt stopped a few times.  Vaping Use   Vaping Use: Never used  Substance and Sexual Activity   Alcohol use: Yes    Comment: 15 beers/week   Drug use: No   Sexual activity: Yes    Birth control/protection: Other-see comments  Other Topics Concern   Not on file   Social History Narrative   Not on file   Social Determinants of Health   Financial Resource Strain: Low Risk  (06/02/2022)   Overall Financial Resource Strain (CARDIA)    Difficulty of Paying Living Expenses: Not hard at all  Food Insecurity: No Food Insecurity (06/02/2022)   Hunger Vital Sign    Worried About Running Out of Food in the Last Year: Never true    Ran Out of Food in the  Last Year: Never true  Transportation Needs: No Transportation Needs (06/02/2022)   PRAPARE - Administrator, Civil Service (Medical): No    Lack of Transportation (Non-Medical): No  Physical Activity: Sufficiently Active (06/02/2022)   Exercise Vital Sign    Days of Exercise per Week: 7 days    Minutes of Exercise per Session: 60 min  Stress: No Stress Concern Present (06/02/2022)   [employer] of Occupational Health - Occupational Stress Questionnaire    Feeling of Stress : Not at all  Social Connections: Moderately Integrated (06/02/2022)   Social Connection and Isolation Panel [NHANES]    Frequency of Communication with Friends and Family: Three times a week    Frequency of Social Gatherings with Friends and Family: Three times a week    Attends Religious Services: More than 4 times per year    Active Member of Clubs or Organizations: No    Attends Banker Meetings: Never    Marital Status: Married    Tobacco Counseling Counseling given: Not Answered Tobacco comments: Pt stopped a few times.   Clinical Intake:  Pre-visit preparation completed: Yes  Pain : No/denies pain Pain Score: 0-No pain     BMI - recorded: 30.54 Nutritional Status: BMI > 30  Obese Nutritional Risks: None Diabetes: No  How often do you need to have someone help you when you read instructions, pamphlets, or other written materials from your doctor or pharmacy?: 1 - Never What is the last grade level you completed in school?: 2 years of college (Associate's Degree)  Diabetic?  No  Interpreter Needed?: No  Information entered by :: Susie Cassette, LPN.   Activities of Daily Living    06/02/2022    8:53 AM  In your present state of health, do you have any difficulty performing the following activities:  Hearing? 0  Vision? 0  Difficulty concentrating or making decisions? 0  Walking or climbing stairs? 0  Dressing or bathing? 0  Doing errands, shopping? 0  Preparing Food and eating ? N  Using the Toilet? N  In the past six months, have you accidently leaked urine? N  Do you have problems with loss of bowel control? N  Managing your Medications? N  Managing your Finances? N  Housekeeping or managing your Housekeeping? N    Patient Care Team: Pincus Sanes, MD as PCP - General (Internal Medicine) Tonny Bollman, MD as PCP - Cardiology (Cardiology) [facility] as Consulting Physician (Optometry)  Indicate any recent Medical Services you may have received from other than [facility] providers in the past year (date may be approximate).     Assessment:   This is a routine wellness examination for [place].  Hearing/Vision screen Hearing Screening - Comments:: Denies hearing difficulties   Vision Screening - Comments:: Wears rx glasses for reading - up to date with routine eye exams with [facility]    Dietary issues and exercise activities discussed: Current Exercise Habits: Home exercise routine, Type of exercise: Other - see comments (SWIMMING 2 TIMES WEEKLY; LANDSCAPING BUSINESS), Time (Minutes): 60, Frequency (Times/Week): 7, Weekly Exercise (Minutes/Week): 420, Intensity: Moderate, Exercise limited by: None identified   Goals Addressed             This Visit's Progress    I would like to lose weight with a goal of 210 pounds. 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  Depression Screen    06/02/2022    8:53 AM 04/08/2022    8:15 AM 03/02/2022    8:19 AM 09/09/2021     8:32 AM 04/24/2021    8:56 AM 04/24/2021    8:54 AM 03/26/2020    8:37 AM  PHQ 2/9 Scores  PHQ - 2 Score 0 0 0 0 0 0 0  PHQ- 9 Score 0 0 0 0       Fall Risk    06/02/2022    8:51 AM 04/08/2022    8:15 AM 03/02/2022    8:19 AM 09/09/2021    8:31 AM 04/24/2021    8:56 AM  Fall Risk   Falls in the past year? 0 0 0 0 0  Number falls in past yr: 0 0 0 0 0  Injury with Fall? 0 0 0 0 0  Risk for fall due to : No Fall Risks No Fall Risks No Fall Risks No Fall Risks   Follow up Falls prevention discussed Falls evaluation completed Falls evaluation completed Falls evaluation completed Falls evaluation completed    FALL RISK PREVENTION PERTAINING TO THE HOME:  Any stairs in or around the home? Yes  If so, are there any without handrails? No  Home free of loose throw rugs in walkways, pet beds, electrical cords, etc? Yes  Adequate lighting in your home to reduce risk of falls? Yes   ASSISTIVE DEVICES UTILIZED TO PREVENT FALLS:  Life alert? No  Use of a cane, walker or w/c? No  Grab bars in the bathroom? No  Shower chair or bench in shower? No  Elevated toilet seat or a handicapped toilet? No   TIMED UP AND GO:  Was the test performed? No . Telephonic Visit   Cognitive Function:        06/02/2022    8:53 AM  6CIT Screen  What Year? 0 points  What month? 0 points  What time? 0 points  Count back from 20 0 points  Months in reverse 0 points  Repeat phrase 0 points  Total Score 0 points    Immunizations Immunization History  Administered Date(s) Administered   Influenza, High Dose Seasonal PF 10/25/2017, 11/18/2017, 11/13/2021   Influenza,inj,Quad PF,6+ Mos 11/09/2018   Influenza,inj,quad, With Preservative 11/04/2019   Influenza-Unspecified 10/26/2015, 11/08/2016, 12/09/2020   PFIZER(Purple Top)SARS-COV-2 Vaccination 03/20/2019, 04/10/2019, 11/22/2019, 08/11/2020, 12/22/2021   PNEUMOCOCCAL CONJUGATE-20 11/13/2021   Pneumococcal Conjugate-13 01/05/2016   Pneumococcal  Polysaccharide-23 01/10/2017   Tdap 05/29/2012   Zoster Recombinat (Shingrix) 02/19/2020, 07/19/2020   Zoster, Live 05/01/2012    TDAP status: Due, Education has been provided regarding the importance of this vaccine. Advised may receive this vaccine at local pharmacy or Health Dept. Aware to provide a copy of the vaccination record if obtained from local pharmacy or Health Dept. Verbalized acceptance and understanding.  Flu Vaccine status: Up to date  Pneumococcal vaccine status: Up to date  Covid-19 vaccine status: Completed vaccines  Qualifies for Shingles Vaccine? Yes   Zostavax completed Yes   Shingrix Completed?: Yes  Screening Tests Health Maintenance  Topic Date Due   COVID-19 Vaccine (6 - 2023-24 season) 02/16/2022   DTaP/Tdap/Td (2 - Td or Tdap) 05/30/2022   INFLUENZA VACCINE  08/26/2022   COLONOSCOPY (Pts 45-30yrs Insurance coverage will need to be confirmed)  03/13/2023   Medicare Annual Wellness (AWV)  06/02/2023   Pneumonia Vaccine 38+ Years old  Completed   Hepatitis C Screening  Completed   HPV VACCINES  Aged  Out   Zoster Vaccines- Shingrix  Discontinued    Health Maintenance  Health Maintenance Due  Topic Date Due   COVID-19 Vaccine (6 - 2023-24 season) 02/16/2022   DTaP/Tdap/Td (2 - Td or Tdap) 05/30/2022    Colorectal cancer screening: Type of screening: Colonoscopy. Completed 03/12/2020. Repeat every 3 years  Lung Cancer Screening: (Low Dose CT Chest recommended if Age 2-80 years, 30 pack-year currently smoking OR have quit w/in 15years.) does not qualify.   Lung Cancer Screening Referral: No  Additional Screening:  Hepatitis C Screening: does qualify; Completed 10/30/2007  Vision Screening: Recommended annual ophthalmology exams for early detection of glaucoma and other disorders of the eye. Is the patient up to date with their annual eye exam?  Yes  Who is the provider or what is the name of the office in which the patient attends annual eye  exams? [facility] If pt is not established with a provider, would they like to be referred to a provider to establish care? No .   Dental Screening: Recommended annual dental exams for proper oral hygiene  Community Resource Referral / Chronic Care Management: CRR required this visit?  No   CCM required this visit?  No      Plan:     I have personally reviewed and noted the following in the patient's chart:   Medical and social history Use of alcohol, tobacco or illicit drugs  Current medications and supplements including opioid prescriptions. Patient is not currently taking opioid prescriptions. Functional ability and status Nutritional status Physical activity Advanced directives List of other physicians Hospitalizations, surgeries, and ER visits in previous 12 months Vitals Screenings to include cognitive, depression, and falls Referrals and appointments  In addition, I have reviewed and discussed with patient certain preventive protocols, quality metrics, and best practice recommendations. A written personalized care plan for preventive services as well as general preventive health recommendations were provided to patient.     Mickeal Needy, LPN   01/30/1094   Nurse Notes:  Normal cognitive status assessed by direct observation via telephone conversation by this Nurse Health Advisor. No abnormalities found.

## 2022-06-02 NOTE — Patient Instructions (Addendum)
Mike Ray , Thank you for taking time to come for your Medicare Wellness Visit. I appreciate your ongoing commitment to your health goals. Please review the following plan we discussed and let me know if I can assist you in the future.   These are the goals we discussed:  Goals      I would like to lose weight with a goal of 210 pounds. 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        This is a list of the screening recommended for you and due dates:  Health Maintenance  Topic Date Due   COVID-19 Vaccine (6 - 2023-24 season) 02/16/2022   DTaP/Tdap/Td vaccine (2 - Td or Tdap) 05/30/2022   Flu Shot  08/26/2022   Colon Cancer Screening  03/13/2023   Medicare Annual Wellness Visit  06/02/2023   Pneumonia Vaccine  Completed   Hepatitis C Screening: USPSTF Recommendation to screen - Ages 18-79 yo.  Completed   HPV Vaccine  Aged Out   Zoster (Shingles) Vaccine  Discontinued    Advanced directives: Yes  Conditions/risks identified: Yes  Next appointment: Follow up in one year for your annual wellness visit.   Preventive Care 82 Years and Older, Male  Preventive care refers to lifestyle choices and visits with your health care provider that can promote health and wellness. What does preventive care include? A yearly physical exam. This is also called an annual well check. Dental exams once or twice a year. Routine eye exams. Ask your health care provider how often you should have your eyes checked. Personal lifestyle choices, including: Daily care of your teeth and gums. Regular physical activity. Eating a healthy diet. Avoiding tobacco and drug use. Limiting alcohol use. Practicing safe sex. Taking low doses of aspirin every day. Taking vitamin and mineral supplements as recommended by your health care provider. What happens during an annual well check? The services and screenings done by your health care provider during  your annual well check will depend on your age, overall health, lifestyle risk factors, and family history of disease. Counseling  Your health care provider may ask you questions about your: Alcohol use. Tobacco use. Drug use. Emotional well-being. Home and relationship well-being. Sexual activity. Eating habits. History of falls. Memory and ability to understand (cognition). Work and work Astronomer. Screening  You may have the following tests or measurements: Height, weight, and BMI. Blood pressure. Lipid and cholesterol levels. These may be checked every 5 years, or more frequently if you are over 41 years old. Skin check. Lung cancer screening. You may have this screening every year starting at age 73 if you have a 30-pack-year history of smoking and currently smoke or have quit within the past 15 years. Fecal occult blood test (FOBT) of the stool. You may have this test every year starting at age 19. Flexible sigmoidoscopy or colonoscopy. You may have a sigmoidoscopy every 5 years or a colonoscopy every 10 years starting at age 60. Prostate cancer screening. Recommendations will vary depending on your family history and other risks. Hepatitis C blood test. Hepatitis B blood test. Sexually transmitted disease (STD) testing. Diabetes screening. This is done by checking your blood sugar (glucose) after you have not eaten for a while (fasting). You may have this done every 1-3 years. Abdominal aortic aneurysm (AAA) screening. You may need this if you are a current or former smoker. Osteoporosis. You may be screened starting at age  70 if you are at high risk. Talk with your health care provider about your test results, treatment options, and if necessary, the need for more tests. Vaccines  Your health care provider may recommend certain vaccines, such as: Influenza vaccine. This is recommended every year. Tetanus, diphtheria, and acellular pertussis (Tdap, Td) vaccine. You may need  a Td booster every 10 years. Zoster vaccine. You may need this after age 84. Pneumococcal 13-valent conjugate (PCV13) vaccine. One dose is recommended after age 22. Pneumococcal polysaccharide (PPSV23) vaccine. One dose is recommended after age 65. Talk to your health care provider about which screenings and vaccines you need and how often you need them. This information is not intended to replace advice given to you by your health care provider. Make sure you discuss any questions you have with your health care provider. Document Released: 02/07/2015 Document Revised: 10/01/2015 Document Reviewed: 11/12/2014 Elsevier Interactive Patient Education  2017 [place] Prevention in the Home Falls can cause injuries. They can happen to people of all ages. There are many things you can do to make your home safe and to help prevent falls. What can I do on the outside of my home? Regularly fix the edges of walkways and driveways and fix any cracks. Remove anything that might make you trip as you walk through a door, such as a raised step or threshold. Trim any bushes or trees on the path to your home. Use bright outdoor lighting. Clear any walking paths of anything that might make someone trip, such as rocks or tools. Regularly check to see if handrails are loose or broken. Make sure that both sides of any steps have handrails. Any raised decks and porches should have guardrails on the edges. Have any leaves, snow, or ice cleared regularly. Use sand or salt on walking paths during winter. Clean up any spills in your garage right away. This includes oil or grease spills. What can I do in the bathroom? Use night lights. Install grab bars by the toilet and in the tub and shower. Do not use towel bars as grab bars. Use non-skid mats or decals in the tub or shower. If you need to sit down in the shower, use a plastic, non-slip stool. Keep the floor dry. Clean up any water that spills on the  floor as soon as it happens. Remove soap buildup in the tub or shower regularly. Attach bath mats securely with double-sided non-slip rug tape. Do not have throw rugs and other things on the floor that can make you trip. What can I do in the bedroom? Use night lights. Make sure that you have a light by your bed that is easy to reach. Do not use any sheets or blankets that are too big for your bed. They should not hang down onto the floor. Have a firm chair that has side arms. You can use this for support while you get dressed. Do not have throw rugs and other things on the floor that can make you trip. What can I do in the kitchen? Clean up any spills right away. Avoid walking on wet floors. Keep items that you use a lot in easy-to-reach places. If you need to reach something above you, use a strong step stool that has a grab bar. Keep electrical cords out of the way. Do not use floor polish or wax that makes floors slippery. If you must use wax, use non-skid floor wax. Do not have throw rugs and other  things on the floor that can make you trip. What can I do with my stairs? Do not leave any items on the stairs. Make sure that there are handrails on both sides of the stairs and use them. Fix handrails that are broken or loose. Make sure that handrails are as long as the stairways. Check any carpeting to make sure that it is firmly attached to the stairs. Fix any carpet that is loose or worn. Avoid having throw rugs at the top or bottom of the stairs. If you do have throw rugs, attach them to the floor with carpet tape. Make sure that you have a light switch at the top of the stairs and the bottom of the stairs. If you do not have them, ask someone to add them for you. What else can I do to help prevent falls? Wear shoes that: Do not have high heels. Have rubber bottoms. Are comfortable and fit you well. Are closed at the toe. Do not wear sandals. If you use a stepladder: Make sure that  it is fully opened. Do not climb a closed stepladder. Make sure that both sides of the stepladder are locked into place. Ask someone to hold it for you, if possible. Clearly mark and make sure that you can see: Any grab bars or handrails. First and last steps. Where the edge of each step is. Use tools that help you move around (mobility aids) if they are needed. These include: Canes. Walkers. Scooters. Crutches. Turn on the lights when you go into a dark area. Replace any light bulbs as soon as they burn out. Set up your furniture so you have a clear path. Avoid moving your furniture around. If any of your floors are uneven, fix them. If there are any pets around you, be aware of where they are. Review your medicines with your doctor. Some medicines can make you feel dizzy. This can increase your chance of falling. Ask your doctor what other things that you can do to help prevent falls. This information is not intended to replace advice given to you by your health care provider. Make sure you discuss any questions you have with your health care provider. Document Released: 11/07/2008 Document Revised: 06/19/2015 Document Reviewed: 02/15/2014 Elsevier Interactive Patient Education  2017 [employer].

## 2022-06-24 ENCOUNTER — Other Ambulatory Visit: Payer: Self-pay | Admitting: Internal Medicine

## 2022-07-08 DIAGNOSIS — M7061 Trochanteric bursitis, right hip: Secondary | ICD-10-CM | POA: Diagnosis not present

## 2022-07-08 DIAGNOSIS — M545 Low back pain, unspecified: Secondary | ICD-10-CM | POA: Diagnosis not present

## 2022-07-15 ENCOUNTER — Encounter: Payer: Self-pay | Admitting: Internal Medicine

## 2022-07-19 ENCOUNTER — Encounter: Payer: Self-pay | Admitting: Internal Medicine

## 2022-07-19 ENCOUNTER — Ambulatory Visit (INDEPENDENT_AMBULATORY_CARE_PROVIDER_SITE_OTHER): Payer: Medicare Other | Admitting: Internal Medicine

## 2022-07-19 VITALS — BP 118/68 | HR 70 | Temp 97.8°F | Ht 71.0 in | Wt 216.0 lb

## 2022-07-19 DIAGNOSIS — I10 Essential (primary) hypertension: Secondary | ICD-10-CM | POA: Diagnosis not present

## 2022-07-19 DIAGNOSIS — M5418 Radiculopathy, sacral and sacrococcygeal region: Secondary | ICD-10-CM | POA: Diagnosis not present

## 2022-07-19 DIAGNOSIS — M48062 Spinal stenosis, lumbar region with neurogenic claudication: Secondary | ICD-10-CM | POA: Diagnosis not present

## 2022-07-19 DIAGNOSIS — Z96641 Presence of right artificial hip joint: Secondary | ICD-10-CM | POA: Diagnosis not present

## 2022-07-19 DIAGNOSIS — M25551 Pain in right hip: Secondary | ICD-10-CM | POA: Diagnosis not present

## 2022-07-19 DIAGNOSIS — R229 Localized swelling, mass and lump, unspecified: Secondary | ICD-10-CM | POA: Diagnosis not present

## 2022-07-19 MED ORDER — HYDROCORTISONE 2.5 % EX CREA: TOPICAL_CREAM | Freq: Two times a day (BID) | CUTANEOUS | 0 refills | Status: DC

## 2022-07-19 NOTE — Progress Notes (Signed)
Subjective:    Patient ID: Mike Ray, male    DOB: [birth year], 72 y.o.   MRN: [id number]      HPI Mike Ray is here for No chief complaint on file.   Lump near rectal area - noticed it 4-5 weeks ago.  He only feels it when he wipes.   He states it is not painful.  There has been no discharge.  He denies any rectal pain.  Bowel movements have been normal.  He denies any blood in stool or blood on the toilet paper.  Pain down right leg-2 weeks ago finished swimming - had numbness and pain from buttock to posterior knee.  He thought it was the hip.  He saw ortho.  Not his hip.  ? back.  His back was bad on xray.  He denies any back pain.  Placed on steroids x 6 days - ended last Thursday.  Then started mobic.  No change with steroids.  When to urgent care - the pain was severe.  Now - no pain - just numbness.  He has had some numbness in the right testicle.  Yesterday  had  sex with his wife and his penis was numb.     Medications and allergies reviewed with patient and updated if appropriate.  Current Outpatient Medications on File Prior to Visit  Medication Sig Dispense Refill   amLODipine (NORVASC) 10 MG tablet Take 1 tablet by mouth once daily 90 tablet 2   aspirin 81 MG tablet Take 81 mg by mouth daily.     atorvastatin (LIPITOR) 80 MG tablet TAKE 1 TABLET BY MOUTH ONCE DAILY IN THE EVENING 90 tablet 0   calcium carbonate (TUMS - DOSED IN MG ELEMENTAL CALCIUM) 500 MG chewable tablet Chew 1 tablet by mouth daily as needed.     Coenzyme Q10 (COQ10) 100 MG CAPS Take 1 capsule by mouth daily.     famotidine (PEPCID) 20 MG tablet Take 20 mg by mouth daily as needed for heartburn or indigestion.     ibuprofen (ADVIL) 200 MG tablet Take 600 mg by mouth daily as needed. Takes 3-4 times weekly     losartan (COZAAR) 100 MG tablet TAKE 1 TABLET BY MOUTH EVERY DAY 90 tablet 3   meloxicam (MOBIC) 15 MG tablet Take 15 mg by mouth daily.     metoprolol tartrate (LOPRESSOR) 25 MG tablet  Take 1/2 (one-half) tablet by mouth twice daily 90 tablet 0   Multiple Vitamin (MULTIVITAMIN) tablet Take 1 tablet by mouth every other day.      nitroGLYCERIN (NITROSTAT) 0.4 MG SL tablet Dissolve 1 tablet under the tongue every 5 minutes as needed for chest pain. Max of 3 doses, then 911 25 tablet 3   sildenafil (REVATIO) 20 MG tablet Take 2-5 tablets by mouth as needed for erectile dysfunction. Do not use with nitroglycerin. 90 tablet 3   No current facility-administered medications on file prior to visit.    Review of Systems     Objective:   Vitals:   07/19/22 0834  BP: 118/68  Pulse: 70  Temp: 97.8 F (36.6 C)  SpO2: 98%   BP Readings from Last 3 Encounters:  07/19/22 118/68  04/08/22 124/80  03/02/22 134/76   Wt Readings from Last 3 Encounters:  07/19/22 216 lb (98 kg)  06/02/22 219 lb (99.3 kg)  04/08/22 223 lb (101.2 kg)   Body mass index is 30.13 kg/m.    Physical Exam Constitutional:  Appearance: Normal appearance.  HENT:     Head: Normocephalic and atraumatic.  Genitourinary:    Comments: Left superior anus there is a lump that is nontender, soft-possible hemorrhoid.  No surrounding erythema Skin:    General: Skin is warm and dry.  Neurological:     Mental Status: He is alert.            Assessment & Plan:    See Problem List for Assessment and Plan of chronic medical problems.

## 2022-07-19 NOTE — Assessment & Plan Note (Signed)
Acute Has seen orthopedics and sees him later today Has numbness and pain in the right posterior leg from proximal leg to posterior knee.  When he has had the pain that has been severe Also has had right testicle numbness and yesterday experienced numbness in the penis Symptoms concerning for sacral radiculopathy Since he is seeing orthopedics later today we will let them consider imaging-has had an x-ray of the lumbar spine already No pain now, but when he does get the pain it is severe so he most likely will need to have some pain medication on hand-he will let me know if Ortho does not provide any for him in which case I will prescribe some

## 2022-07-19 NOTE — Assessment & Plan Note (Signed)
In the anal region-present for 4-5 weeks Has not been tender which is surprising because it does feel like a hemorrhoid Likely abscess since it is not tender Likely unrelated to his current back-like issues Try hydrocortisone 2.5% twice daily Will refer to gastroenterology

## 2022-07-19 NOTE — Assessment & Plan Note (Signed)
Chronic BP well controlled Continue amlodipine 10 mg daily, losartan 100 mg daily, metoprolol 12.5 mg bid 

## 2022-07-19 NOTE — Patient Instructions (Addendum)
      Medications changes include :   hydrocortisone 2.5 % cream    A referral was ordered for GI.    Return if symptoms worsen or fail to improve.

## 2022-07-26 ENCOUNTER — Ambulatory Visit (INDEPENDENT_AMBULATORY_CARE_PROVIDER_SITE_OTHER): Payer: Medicare Other | Admitting: Gastroenterology

## 2022-07-26 ENCOUNTER — Encounter: Payer: Self-pay | Admitting: Gastroenterology

## 2022-07-26 VITALS — BP 132/90 | HR 64 | Ht 69.69 in | Wt 219.5 lb

## 2022-07-26 DIAGNOSIS — N489 Disorder of penis, unspecified: Secondary | ICD-10-CM

## 2022-07-26 DIAGNOSIS — R200 Anesthesia of skin: Secondary | ICD-10-CM | POA: Diagnosis not present

## 2022-07-26 DIAGNOSIS — K610 Anal abscess: Secondary | ICD-10-CM | POA: Diagnosis not present

## 2022-07-26 MED ORDER — CIPROFLOXACIN HCL 500 MG PO TABS: 500.0000 mg | ORAL_TABLET | Freq: Two times a day (BID) | ORAL | 0 refills | Status: DC

## 2022-07-26 MED ORDER — METRONIDAZOLE 500 MG PO TABS: 500.0000 mg | ORAL_TABLET | Freq: Three times a day (TID) | ORAL | 0 refills | Status: DC

## 2022-07-26 NOTE — Progress Notes (Signed)
Chief Complaint: Rectal lump  HPI: 72 year old male history of CAD (on aspirin), hypertension, STEMI, presents for evaluation of rectal lump.  Patient recently seen by PCP and noted to have a lump in the anal region present for 4 to 5 weeks.  Was given hydrocortisone 2.5% twice daily and referred to GI for further evaluation.  Colonoscopy 03/12/2020 done for screening with Dr. Barron Alvine: 4 tubular adenomas in sigmoid colon, multiple small and large mouth diverticula were found in sigmoid colon.  Otherwise normal. Due for repeat 02/2023.  Patient reports the lump in his rectal area has improved with hydrocortisone, but has not resolved. Nontender, not bleeding. States he only noticed it because he can feel it upon wiping. States he is very anxious as he recently lost a friend to cancer. Denies change in bowel habits, hematochezia, melena, weight loss, abdominal pain, nausea and vomiting.  He also states he has been struggling with right side sciatic nerve pain and some loss of sensation in his scrotum/penis. Reports he had sex last week with "loss of sensation." He is on prednisone which has improved his leg pain, but he is still very concerned about the sensation changes in his right scrotum and penis.  Past Medical History:  Diagnosis Date   Acute MI, inferolateral wall, initial episode of care (HCC) 07/30/2015   Coronary artery disease    Elevated PSA    Dr Edwin Cap   Gilbert's syndrome    History of heat stroke 2009   History of nephrolithiasis    Hyperlipidemia    NMR 06/2009: LDL 88(1345/912), HDL 40, TG 127. Framingham Study LDL goal =< 130    Hypertension    Osteoarthritis of left hip    hip replacement right and left    Past Surgical History:  Procedure Laterality Date   CARDIAC CATHETERIZATION     CARDIAC CATHETERIZATION N/A 07/30/2015   Procedure: Left Heart Cath and Coronary Angiography;  Surgeon: Tonny Bollman, MD;  Location: [facility] INVASIVE CV LAB;  Service: Cardiovascular;   Laterality: N/A;   CARDIAC CATHETERIZATION N/A 07/30/2015   Procedure: Coronary Stent Intervention;  Surgeon: Tonny Bollman, MD;  Location: [facility] INVASIVE CV LAB;  Service: Cardiovascular;  Laterality: N/A;  Distal RCA- Promus 3.50x16   COLONOSCOPY  2011   negative   INGUINAL HERNIA REPAIR Right 10/19/2017   Procedure: RIGHT  INGUINAL HERNIA REPAIR ERAS PATHWAY;  Surgeon: Harriette Bouillon, MD;  Location: [facility];  Service: General;  Laterality: Right;  Tap block   INSERTION OF MESH Right 10/19/2017   Procedure: INSERTION OF MESH;  Surgeon: Harriette Bouillon, MD;  Location: [facility];  Service: General;  Laterality: Right;   LITHOTRIPSY  2009   Dr.Duckett, [place]    TOTAL HIP ARTHROPLASTY  2008   left   TOTAL HIP ARTHROPLASTY  2004   right    Current Outpatient Medications  Medication Sig Dispense Refill   amLODipine (NORVASC) 10 MG tablet Take 1 tablet by mouth once daily 90 tablet 2   aspirin 81 MG tablet Take 81 mg by mouth daily.     atorvastatin (LIPITOR) 80 MG tablet TAKE 1 TABLET BY MOUTH ONCE DAILY IN THE EVENING 90 tablet 0   calcium carbonate (TUMS - DOSED IN MG ELEMENTAL CALCIUM) 500 MG chewable tablet Chew 1 tablet by mouth daily as needed.     Coenzyme Q10 (COQ10) 100 MG CAPS Take 1 capsule by mouth daily.     famotidine (PEPCID) 20 MG tablet Take 20  mg by mouth daily as needed for heartburn or indigestion.     hydrocortisone 2.5 % cream Apply topically 2 (two) times daily. 30 g 0   ibuprofen (ADVIL) 200 MG tablet Take 600 mg by mouth daily as needed. Takes 3-4 times weekly     losartan (COZAAR) 100 MG tablet TAKE 1 TABLET BY MOUTH EVERY DAY 90 tablet 3   meloxicam (MOBIC) 15 MG tablet Take 15 mg by mouth daily.     metoprolol tartrate (LOPRESSOR) 25 MG tablet Take 1/2 (one-half) tablet by mouth twice daily 90 tablet 0   Multiple Vitamin (MULTIVITAMIN) tablet Take 1 tablet by mouth every other day.      nitroGLYCERIN (NITROSTAT) 0.4 MG SL tablet  Dissolve 1 tablet under the tongue every 5 minutes as needed for chest pain. Max of 3 doses, then 911 25 tablet 3   sildenafil (REVATIO) 20 MG tablet Take 2-5 tablets by mouth as needed for erectile dysfunction. Do not use with nitroglycerin. 90 tablet 3   No current facility-administered medications for this visit.    Allergies as of 07/26/2022   (No Known Allergies)    Family History  Problem Relation Age of Onset   Diabetes Mother    Hypertension Mother    Heart attack Father 72       smoker   Prostate cancer Brother 54       CAD; stents @ 53   Hypertension Brother    Heart attack Brother 21   Coronary artery disease Brother        stents late 65s   Stroke Neg Hx    Colon cancer Neg Hx    Colon polyps Neg Hx    Esophageal cancer Neg Hx    Rectal cancer Neg Hx    Stomach cancer Neg Hx     Social History   Socioeconomic History   Marital status: Married    Spouse name: Not on file   Number of children: 3   Years of education: Not on file   Highest education level: Not on file  Occupational History   Occupation: Surveyor, minerals  Tobacco Use   Smoking status: Former    Packs/day: 1.00    Years: 32.00    Additional pack years: 0.00    Total pack years: 32.00    Types: Cigarettes    Start date: 57    Quit date: 01/26/2000    Years since quitting: 22.5   Smokeless tobacco: Never   Tobacco comments:    Pt stopped a few times.  Vaping Use   Vaping Use: Never used  Substance and Sexual Activity   Alcohol use: Yes    Comment: 15 beers/week   Drug use: No   Sexual activity: Yes    Birth control/protection: Other-see comments  Other Topics Concern   Not on file  Social History Narrative   Not on file   Social Determinants of Health   Financial Resource Strain: Low Risk  (06/02/2022)   Overall Financial Resource Strain (CARDIA)    Difficulty of Paying Living Expenses: Not hard at all  Food Insecurity: No Food Insecurity (06/02/2022)   Hunger Vital Sign    Worried  About Running Out of Food in the Last Year: Never true    Ran Out of Food in the Last Year: Never true  Transportation Needs: No Transportation Needs (06/02/2022)   PRAPARE - Administrator, Civil Service (Medical): No    Lack of Transportation (Non-Medical): No  Physical Activity: Sufficiently Active (06/02/2022)   Exercise Vital Sign    Days of Exercise per Week: 7 days    Minutes of Exercise per Session: 60 min  Stress: No Stress Concern Present (06/02/2022)   [employer] of Occupational Health - Occupational Stress Questionnaire    Feeling of Stress : Not at all  Social Connections: Moderately Integrated (06/02/2022)   Social Connection and Isolation Panel [NHANES]    Frequency of Communication with Friends and Family: Three times a week    Frequency of Social Gatherings with Friends and Family: Three times a week    Attends Religious Services: More than 4 times per year    Active Member of Clubs or Organizations: No    Attends Banker Meetings: Never    Marital Status: Married  Catering manager Violence: Not At Risk (06/02/2022)   Humiliation, Afraid, Rape, and Kick questionnaire    Fear of Current or Ex-Partner: No    Emotionally Abused: No    Physically Abused: No    Sexually Abused: No    Review of Systems:    Constitutional: No weight loss, fever, chills, weakness or fatigue HEENT: Eyes: No change in vision               Ears, Nose, Throat:  No change in hearing or congestion Skin: No rash or itching Cardiovascular: No chest pain, chest pressure or palpitations   Respiratory: No SOB or cough Gastrointestinal: See HPI and otherwise negative Genitourinary: No dysuria or change in urinary frequency Neurological: No headache, dizziness or syncope Musculoskeletal: No new muscle or joint pain Hematologic: No bleeding or bruising Psychiatric: No history of depression or anxiety    Physical Exam:  Vital signs: There were no vitals taken for this  visit.  Constitutional:  appears to be in NAD, Well developed, Well nourished, alert and cooperative Head:  Normocephalic and atraumatic. Eyes:   PEERL, EOMI. No icterus. Conjunctiva pink. Gastrointestinal:  Soft, nondistended No rebound or guarding. Normal bowel sounds. No appreciable masses or hepatomegaly. Rectal:  Robin CMA - Chaperone. Erythematous, indurated left sided perianal abscess with fluctuance. Nontender. Msk:  Symmetrical without gross deformities. Without edema, no deformity or joint abnormality.  Neurologic:  Alert and  oriented x4;  grossly normal neurologically.  Skin:   Dry and intact without significant lesions or rashes. Psychiatric: Oriented to person, place and time. Demonstrates good judgement and reason without abnormal affect or behaviors.   RELEVANT LABS AND IMAGING: CBC    Component Value Date/Time   WBC 5.2 09/09/2021 0852   RBC 4.67 09/09/2021 0852   HGB 15.2 09/09/2021 0852   HCT 44.5 09/09/2021 0852   PLT 227.0 09/09/2021 0852   MCV 95.4 09/09/2021 0852   MCH 33.0 08/06/2019 0848   MCHC 34.2 09/09/2021 0852   RDW 13.1 09/09/2021 0852   LYMPHSABS 1.6 09/09/2021 0852   MONOABS 0.4 09/09/2021 0852   EOSABS 0.1 09/09/2021 0852   BASOSABS 0.0 09/09/2021 0852    CMP     Component Value Date/Time   NA 140 03/02/2022 0847   K 4.3 03/02/2022 0847   CL 104 03/02/2022 0847   CO2 24 03/02/2022 0847   GLUCOSE 124 (H) 03/02/2022 0847   BUN 15 03/02/2022 0847   CREATININE 0.89 03/02/2022 0847   CREATININE 0.80 08/06/2019 0848   CALCIUM 9.8 03/02/2022 0847   PROT 7.5 03/02/2022 0847   ALBUMIN 4.9 03/02/2022 0847   AST 23 03/02/2022 0847   ALT 30 03/02/2022 0847  ALKPHOS 54 03/02/2022 0847   BILITOT 1.3 (H) 03/02/2022 0847   GFRNONAA >60 10/17/2017 1228   GFRAA >60 10/17/2017 1228    Assessment/Plan: 1. Perianal abscess on rectal exam appears patient has perianal abscess. Suspect it may be nontender secondary to loss of sensation from sciatic  issues. - Warm compresses TID - Sitz baths - Cipro 500mg  BID and Flagyl 500mg  TID x 10 days. Discussed cessation of alcohol use while using Flagyl - Call us back if no improvement. Can consider Pelvic MRI for further evaluation at that time. - Follow up PRN  2. Loss of sensation in penis/scrotum - referral to [facility] for further evaluation  3. History of colon polyps - Due for repeat colonoscopy 02/2023  Boone Master, PA-C [facility] 07/26/2022, 8:26 AM  Cc: Pincus Sanes, MD

## 2022-07-26 NOTE — Patient Instructions (Signed)
We have sent the following medications to your pharmacy for you to pick up at your convenience: Cipro Flagyl  We will refer you to [facility] and they will call you with an appointment   Follow up as needed  _______________________________________________________  If your blood pressure at your visit was 140/90 or greater, please contact your primary care physician to follow up on this.  _______________________________________________________  If you are age 51 or older, your body mass index should be between 23-30. Your Body mass index is 31.78 kg/m. If this is out of the aforementioned range listed, please consider follow up with your Primary Care Provider.  If you are age 72 or younger, your body mass index should be between 19-25. Your Body mass index is 31.78 kg/m. If this is out of the aformentioned range listed, please consider follow up with your Primary Care Provider.   ________________________________________________________  The [facility] providers would like to encourage you to use [facility] to communicate with providers for non-urgent requests or questions.  Due to long hold times on the telephone, sending your provider a message by [facility] may be a faster and more efficient way to get a response.  Please allow 48 business hours for a response.  Please remember that this is for non-urgent requests.  _______________________________________________________   I appreciate the  opportunity to care for you  Thank You   [facility]

## 2022-07-26 NOTE — Progress Notes (Signed)
Agree with the assessment and plan as outlined by Cira Servant, PA-C.  Enez Monahan, DO, [facility]

## 2022-07-28 ENCOUNTER — Telehealth: Payer: Self-pay | Admitting: *Deleted

## 2022-07-28 DIAGNOSIS — R200 Anesthesia of skin: Secondary | ICD-10-CM

## 2022-07-28 DIAGNOSIS — N489 Disorder of penis, unspecified: Secondary | ICD-10-CM

## 2022-07-28 NOTE — Telephone Encounter (Signed)
Faxed referral to [facility] today.  Fax # [phone]

## 2022-08-26 DIAGNOSIS — C44319 Basal cell carcinoma of skin of other parts of face: Secondary | ICD-10-CM | POA: Diagnosis not present

## 2022-08-26 DIAGNOSIS — D485 Neoplasm of uncertain behavior of skin: Secondary | ICD-10-CM | POA: Diagnosis not present

## 2022-08-26 DIAGNOSIS — L570 Actinic keratosis: Secondary | ICD-10-CM | POA: Diagnosis not present

## 2022-08-26 DIAGNOSIS — C44212 Basal cell carcinoma of skin of right ear and external auricular canal: Secondary | ICD-10-CM | POA: Diagnosis not present

## 2022-08-27 ENCOUNTER — Other Ambulatory Visit: Payer: Self-pay | Admitting: Internal Medicine

## 2022-09-08 NOTE — Telephone Encounter (Signed)
Called [facility] and they have patients referral but they are behind on referrals. He has not been scheduled yet

## 2022-09-24 ENCOUNTER — Other Ambulatory Visit: Payer: Self-pay | Admitting: Internal Medicine

## 2022-09-24 ENCOUNTER — Other Ambulatory Visit: Payer: Self-pay | Admitting: Cardiovascular Disease

## 2022-10-05 ENCOUNTER — Encounter: Payer: Self-pay | Admitting: Internal Medicine

## 2022-10-05 NOTE — Progress Notes (Unsigned)
Subjective:    Patient ID: Mike Ray, male    DOB: July 08, 1950, 72 y.o.   MRN: [id number]     HPI Mike Ray is here for follow up of his chronic medical problems.  Doing good - lump in rectal area resolved.  Had  sciatica which is gone.   Sugars 85-150  Swims 2 per week.  Medications and allergies reviewed with patient and updated if appropriate.  Current Outpatient Medications on File Prior to Visit  Medication Sig Dispense Refill   amLODipine (NORVASC) 10 MG tablet Take 1 tablet by mouth once daily 90 tablet 2   aspirin 81 MG tablet Take 81 mg by mouth daily.     atorvastatin (LIPITOR) 80 MG tablet TAKE 1 TABLET BY MOUTH ONCE DAILY IN THE EVENING 90 tablet 0   calcium carbonate (TUMS - DOSED IN MG ELEMENTAL CALCIUM) 500 MG chewable tablet Chew 1 tablet by mouth daily as needed.     Coenzyme Q10 (COQ10) 100 MG CAPS Take 1 capsule by mouth daily.     famotidine (PEPCID) 20 MG tablet Take 20 mg by mouth daily as needed for heartburn or indigestion.     hydrocortisone 2.5 % cream Apply topically 2 (two) times daily. 30 g 0   losartan (COZAAR) 100 MG tablet TAKE 1 TABLET BY MOUTH EVERY DAY 30 tablet 0   meloxicam (MOBIC) 15 MG tablet Take 15 mg by mouth daily.     metoprolol tartrate (LOPRESSOR) 25 MG tablet Take 1/2 (one-half) tablet by mouth twice daily 90 tablet 0   Multiple Vitamin (MULTIVITAMIN) tablet Take 1 tablet by mouth every other day.      nitroGLYCERIN (NITROSTAT) 0.4 MG SL tablet Dissolve 1 tablet under the tongue every 5 minutes as needed for chest pain. Max of 3 doses, then 911 25 tablet 3   sildenafil (REVATIO) 20 MG tablet Take 2-5 tablets by mouth as needed for erectile dysfunction. Do not use with nitroglycerin. 90 tablet 3   No current facility-administered medications on file prior to visit.     Review of Systems  Constitutional:  Negative for fever.  Respiratory:  Negative for cough, shortness of breath and wheezing.   Cardiovascular:   Negative for chest pain, palpitations and leg swelling.  Neurological:  Negative for light-headedness and headaches.       Objective:   Vitals:   10/06/22 0746  BP: 124/86  Pulse: 64  Temp: 97.7 F (36.5 C)  SpO2: 98%   BP Readings from Last 3 Encounters:  10/06/22 124/86  07/26/22 (!) 132/90  07/19/22 118/68   Wt Readings from Last 3 Encounters:  10/06/22 219 lb 6.4 oz (99.5 kg)  07/26/22 219 lb 8 oz (99.6 kg)  07/19/22 216 lb (98 kg)   Body mass index is 31.76 kg/m.    Physical Exam Constitutional:      General: He is not in acute distress.    Appearance: Normal appearance. He is not ill-appearing.  HENT:     Head: Normocephalic and atraumatic.  Eyes:     Conjunctiva/sclera: Conjunctivae normal.  Cardiovascular:     Rate and Rhythm: Normal rate and regular rhythm.     Heart sounds: Normal heart sounds.  Pulmonary:     Effort: Pulmonary effort is normal. No respiratory distress.     Breath sounds: Normal breath sounds. No wheezing or rales.  Musculoskeletal:     Right lower leg: No edema.     Left lower leg: No  edema.  Skin:    General: Skin is warm and dry.     Findings: No rash.  Neurological:     Mental Status: He is alert. Mental status is at baseline.  Psychiatric:        Mood and Affect: Mood normal.        Lab Results  Component Value Date   WBC 5.2 09/09/2021   HGB 15.2 09/09/2021   HCT 44.5 09/09/2021   PLT 227.0 09/09/2021   GLUCOSE 124 (H) 03/02/2022   CHOL 104 03/02/2022   TRIG 211.0 (H) 03/02/2022   HDL 32.50 (L) 03/02/2022   LDLDIRECT 49.0 03/02/2022   LDLCALC 32 08/12/2021   ALT 30 03/02/2022   AST 23 03/02/2022   NA 140 03/02/2022   K 4.3 03/02/2022   CL 104 03/02/2022   CREATININE 0.89 03/02/2022   BUN 15 03/02/2022   CO2 24 03/02/2022   TSH 1.86 02/09/2021   PSA 2.37 08/06/2019   INR 1.20 07/30/2015   HGBA1C 6.2 03/02/2022     Assessment & Plan:    See Problem List for Assessment and Plan of chronic medical  problems.

## 2022-10-05 NOTE — Patient Instructions (Addendum)
      Blood work was ordered.   The lab is on the first floor.    Medications changes include :   none     Return in about 6 months (around 04/05/2023) for follow up.

## 2022-10-06 ENCOUNTER — Encounter: Payer: Self-pay | Admitting: Internal Medicine

## 2022-10-06 ENCOUNTER — Ambulatory Visit (INDEPENDENT_AMBULATORY_CARE_PROVIDER_SITE_OTHER): Payer: Medicare Other | Admitting: Internal Medicine

## 2022-10-06 VITALS — BP 124/86 | HR 64 | Temp 97.7°F | Ht 69.69 in | Wt 219.4 lb

## 2022-10-06 DIAGNOSIS — I10 Essential (primary) hypertension: Secondary | ICD-10-CM

## 2022-10-06 DIAGNOSIS — I2510 Atherosclerotic heart disease of native coronary artery without angina pectoris: Secondary | ICD-10-CM | POA: Diagnosis not present

## 2022-10-06 DIAGNOSIS — R7303 Prediabetes: Secondary | ICD-10-CM | POA: Diagnosis not present

## 2022-10-06 DIAGNOSIS — E782 Mixed hyperlipidemia: Secondary | ICD-10-CM

## 2022-10-06 LAB — CBC WITH DIFFERENTIAL/PLATELET
Basophils Absolute: 0 10*3/uL (ref 0.0–0.1)
Basophils Relative: 0.8 % (ref 0.0–3.0)
Eosinophils Absolute: 0.2 10*3/uL (ref 0.0–0.7)
Eosinophils Relative: 2.7 % (ref 0.0–5.0)
HCT: 46 % (ref 39.0–52.0)
Hemoglobin: 15.5 g/dL (ref 13.0–17.0)
Lymphocytes Relative: 36.7 % (ref 12.0–46.0)
Lymphs Abs: 2.1 10*3/uL (ref 0.7–4.0)
MCHC: 33.7 g/dL (ref 30.0–36.0)
MCV: 96.7 fl (ref 78.0–100.0)
Monocytes Absolute: 0.6 10*3/uL (ref 0.1–1.0)
Monocytes Relative: 10 % (ref 3.0–12.0)
Neutro Abs: 2.8 10*3/uL (ref 1.4–7.7)
Neutrophils Relative %: 49.8 % (ref 43.0–77.0)
Platelets: 255 10*3/uL (ref 150.0–400.0)
RBC: 4.76 Mil/uL (ref 4.22–5.81)
RDW: 13.2 % (ref 11.5–15.5)
WBC: 5.7 10*3/uL (ref 4.0–10.5)

## 2022-10-06 LAB — COMPREHENSIVE METABOLIC PANEL
ALT: 43 U/L (ref 0–53)
AST: 33 U/L (ref 0–37)
Albumin: 4.8 g/dL (ref 3.5–5.2)
Alkaline Phosphatase: 56 U/L (ref 39–117)
BUN: 17 mg/dL (ref 6–23)
CO2: 26 meq/L (ref 19–32)
Calcium: 9.9 mg/dL (ref 8.4–10.5)
Chloride: 104 meq/L (ref 96–112)
Creatinine, Ser: 0.9 mg/dL (ref 0.40–1.50)
GFR: 85.39 mL/min (ref >60.00)
Glucose, Bld: 115 mg/dL — ABNORMAL HIGH (ref 70–99)
Potassium: 4 meq/L (ref 3.5–5.1)
Sodium: 139 meq/L (ref 135–145)
Total Bilirubin: 1.5 mg/dL — ABNORMAL HIGH (ref 0.2–1.2)
Total Protein: 7.5 g/dL (ref 6.0–8.3)

## 2022-10-06 LAB — LIPID PANEL
Cholesterol: 105 mg/dL (ref 0–200)
HDL: 38.5 mg/dL — ABNORMAL LOW (ref >39.00)
LDL Cholesterol: 38 mg/dL (ref 0–99)
NonHDL: 66.09
Total CHOL/HDL Ratio: 3
Triglycerides: 139 mg/dL (ref 0.0–149.0)
VLDL: 27.8 mg/dL (ref 0.0–40.0)

## 2022-10-06 LAB — HEMOGLOBIN A1C: Hgb A1c MFr Bld: 6.2 % (ref 4.6–6.5)

## 2022-10-06 NOTE — Assessment & Plan Note (Signed)
Chronic Regular exercise and healthy diet encouraged Check lipid panel  Continue atorvastatin 80 mg daily 

## 2022-10-06 NOTE — Assessment & Plan Note (Signed)
Chronic Follows with cardiology No symptoms consistent with angina Continue atorvastatin 80 mg daily, aspirin 81 mg daily Blood pressure well-controlled Continue regular exercise, healthy diet

## 2022-10-06 NOTE — Assessment & Plan Note (Addendum)
Chronic Lab Results  Component Value Date   HGBA1C 6.2 03/02/2022   Check a1c Low sugar / carb diet Stressed regular exercise

## 2022-10-06 NOTE — Assessment & Plan Note (Signed)
Chronic BP well controlled Cmp, cbc Continue amlodipine 10 mg daily, losartan 100 mg daily, metoprolol 12.5 mg bid

## 2022-10-24 ENCOUNTER — Other Ambulatory Visit: Payer: Self-pay | Admitting: Cardiovascular Disease

## 2022-11-02 DIAGNOSIS — C44319 Basal cell carcinoma of skin of other parts of face: Secondary | ICD-10-CM | POA: Diagnosis not present

## 2022-11-09 DIAGNOSIS — C44212 Basal cell carcinoma of skin of right ear and external auricular canal: Secondary | ICD-10-CM | POA: Diagnosis not present

## 2022-11-16 DIAGNOSIS — Z23 Encounter for immunization: Secondary | ICD-10-CM | POA: Diagnosis not present

## 2022-11-29 ENCOUNTER — Other Ambulatory Visit: Payer: Self-pay | Admitting: Internal Medicine

## 2022-12-16 ENCOUNTER — Other Ambulatory Visit: Payer: Self-pay | Admitting: Internal Medicine

## 2022-12-28 DIAGNOSIS — H2513 Age-related nuclear cataract, bilateral: Secondary | ICD-10-CM | POA: Diagnosis not present

## 2022-12-29 ENCOUNTER — Other Ambulatory Visit: Payer: Self-pay | Admitting: Internal Medicine

## 2023-01-11 DIAGNOSIS — I781 Nevus, non-neoplastic: Secondary | ICD-10-CM | POA: Diagnosis not present

## 2023-01-11 DIAGNOSIS — L578 Other skin changes due to chronic exposure to nonionizing radiation: Secondary | ICD-10-CM | POA: Diagnosis not present

## 2023-01-11 DIAGNOSIS — L821 Other seborrheic keratosis: Secondary | ICD-10-CM | POA: Diagnosis not present

## 2023-01-11 DIAGNOSIS — L570 Actinic keratosis: Secondary | ICD-10-CM | POA: Diagnosis not present

## 2023-01-11 DIAGNOSIS — Z85828 Personal history of other malignant neoplasm of skin: Secondary | ICD-10-CM | POA: Diagnosis not present

## 2023-01-11 DIAGNOSIS — L720 Epidermal cyst: Secondary | ICD-10-CM | POA: Diagnosis not present

## 2023-01-21 ENCOUNTER — Other Ambulatory Visit: Payer: Self-pay | Admitting: Cardiovascular Disease

## 2023-01-28 NOTE — Telephone Encounter (Signed)
 Med Rec complete, allergies and Pharmacy verified January 3

## 2023-01-30 NOTE — Assessment & Plan Note (Deleted)
 Last labs 09/2018 with a chol 105, LDL 38, HDL 38.5, and trig 139.

## 2023-01-30 NOTE — Progress Notes (Signed)
Pt rescheduled this appt.

## 2023-01-31 ENCOUNTER — Ambulatory Visit: Payer: Medicare Other | Attending: Cardiovascular Disease | Admitting: Cardiovascular Disease

## 2023-01-31 ENCOUNTER — Encounter: Payer: Self-pay | Admitting: Cardiovascular Disease

## 2023-01-31 ENCOUNTER — Ambulatory Visit (INDEPENDENT_AMBULATORY_CARE_PROVIDER_SITE_OTHER): Payer: Medicare Other | Admitting: Cardiovascular Disease

## 2023-01-31 VITALS — BP 110/84 | HR 82 | Ht 70.0 in | Wt 228.6 lb

## 2023-01-31 DIAGNOSIS — E782 Mixed hyperlipidemia: Secondary | ICD-10-CM | POA: Diagnosis not present

## 2023-01-31 DIAGNOSIS — I10 Essential (primary) hypertension: Secondary | ICD-10-CM

## 2023-01-31 DIAGNOSIS — I25119 Atherosclerotic heart disease of native coronary artery with unspecified angina pectoris: Secondary | ICD-10-CM | POA: Diagnosis not present

## 2023-01-31 DIAGNOSIS — I2510 Atherosclerotic heart disease of native coronary artery without angina pectoris: Secondary | ICD-10-CM

## 2023-01-31 MED ORDER — NITROGLYCERIN 0.4 MG SL SUBL: SUBLINGUAL_TABLET | SUBLINGUAL | 3 refills | Status: DC

## 2023-01-31 NOTE — Assessment & Plan Note (Signed)
 Blood pressure well-controlled on amlodipine, losartan, and metoprolol.

## 2023-01-31 NOTE — Progress Notes (Signed)
 Cardiology Office Note:    Date:  01/31/2023   ID:  Mike Ray, DOB [birth year], MRN [id number]  PCP:  Geofm Glade PARAS, MD   [facility] Providers Cardiologist:  Ozell Fell, MD     Referring MD: Geofm Glade PARAS, MD   Chief Complaint  Patient presents with   Coronary Artery Disease    History of Present Illness:    Mike Ray is a 73 y.o. male with a hx of:  Coronary artery disease S/p inferolateral MI in 2017>> PCI to the RCA Myoview  2019: no ischemia Echo 9/19: EF 55-60 Hypertension Hyperlipidemia Gilbert's syndrome  The patient is here alone today.  He has been experiencing several days of a sharp pain in the left chest wall going deep into the chest that he describes as a sharp ache.  This is concerned him quite a bit and he called in to be added on for evaluation.  He states that these pains only last for a few seconds.  However, he has been having them up to 20 times per day and has become more concerned about it.  His pain that he experienced at the time of his heart attack was more of a central pressure radiating to the neck and jaw with no similarities to his current episodes.  He did some work with his left arm and thinks that this could have been related but is not sure.  He has mostly been worried that this could be heart related.  He has had no shortness of breath, edema, or heart palpitations.  This pain has not worsened or been brought on by physical exertion such as swimming for exercise.  Current Medications: Current Meds  Medication Sig   amLODipine  (NORVASC ) 10 MG tablet Take 1 tablet by mouth once daily   aspirin  81 MG tablet Take 81 mg by mouth daily.   atorvastatin  (LIPITOR ) 80 MG tablet TAKE 1 TABLET BY MOUTH ONCE DAILY IN THE EVENING   calcium  carbonate (TUMS - DOSED IN MG ELEMENTAL CALCIUM ) 500 MG chewable tablet Chew 1 tablet by mouth daily as needed.   Coenzyme Q10 (COQ10) 100 MG CAPS Take 1 capsule by mouth daily.    famotidine (PEPCID) 20 MG tablet Take 20 mg by mouth daily as needed for heartburn or indigestion.   losartan  (COZAAR ) 100 MG tablet TAKE 1 TABLET BY MOUTH EVERY DAY   metoprolol  tartrate (LOPRESSOR ) 25 MG tablet Take 1/2 (one-half) tablet by mouth twice daily   Multiple Vitamin (MULTIVITAMIN) tablet Take 1 tablet by mouth every other day.    sildenafil  (REVATIO ) 20 MG tablet Take 2-5 tablets by mouth as needed for erectile dysfunction. Do not use with nitroglycerin .   [DISCONTINUED] nitroGLYCERIN  (NITROSTAT ) 0.4 MG SL tablet Dissolve 1 tablet under the tongue every 5 minutes as needed for chest pain. Max of 3 doses, then 911     Allergies:   Patient has no known allergies.   ROS:   Please see the history of present illness.    All other systems reviewed and are negative.  EKGs/Labs/Other Studies Reviewed:    The following studies were reviewed today: Cardiac Studies & Procedures   CARDIAC CATHETERIZATION  CARDIAC CATHETERIZATION 07/30/2015  Narrative  Prox Cx to Mid Cx lesion, 25% stenosed.  Mid LAD lesion, 30% stenosed.  1st Diag lesion, 35% stenosed.  There is mild left ventricular systolic dysfunction.  Dist RCA lesion, 100% stenosed. Post intervention, there is a 0% residual stenosis.  1. Acute inferolateral  STEMI secondary to total occlusion of the distal RCA, treated successfully with primary PCI using a drug-eluting stent  2. Mild nonobstructive stenosis of the LAD and left circumflex  3. Mild segmental contraction abnormality left ventricle with well-preserved overall LVEF  Recommendations: Post MI medical therapy. Aspirin  and brilinta  for at least 12 months. If hospital course is uncomplicated, patient could be transferred out of the ICU tomorrow and discharged home in 48 hours.  Findings Coronary Findings Diagnostic  Dominance: Right  Left Main The left mainstem is calcified with no obstructive disease. The left main divides into the LAD and left  circumflex.  Left Anterior Descending The LAD reaches the left ventricular apex. The vessel supplies a large first diagonal branch. The mid LAD has 30% stenosis. The vessel has no high-grade obstruction.  First Diagonal Branch Diffuse.  Ramus Intermedius The ramus intermedius branch is small in caliber with no high-grade obstruction.  Left Circumflex The left circumflex is moderately calcified. The vessel has scattered nonobstructive plaques in the proximal and mid circumflex with no more than 25% stenosis present.  Right Coronary Artery With heavy thrombus. The distal RCA is occluded. The occlusion appears acute based on contrast staining pattern and tapered beak at the occlusion. After reperfusion, the distal PDA is occluded and this is unchanged following stenting. The distal occlusion of the PDA is in such a distal segment that PCI was not attempted.  Intervention  Dist RCA lesion PCI There is no pre-interventional antegrade distal flow (TIMI 0). Pre-stent angioplasty was performed. A drug-eluting stent was placed. Post-stent angioplasty was performed. Maximum pressure: 14 atm. The post-interventional distal flow is normal (TIMI 3). The intervention was successful. No complications occurred at this lesion. A JR4 guide catheter is used. Heparin  and Aggrastat  are used for anticoagulation. A therapeutic ACT is achieved (230). A cougar guidewire is advanced across the total occlusion of the distal RCA without difficulty. The lesion is predilated with a 2.5 x 12 mm balloon. The patient developed bradycardia and hypotension after reperfusion. 0.5 mg of atropine  is administered. 0.9% saline boluses administered. The lesion is stented with a 3.5 x 16 mm Promus DES, carefully positioned just proximal to the distal RCA trifurcation with complete lesion coverage. The stent is deployed at 12 atm. The stent is postdilated with a 3.5 x 12 mm noncompliant balloon to 14 atm. There is a 0% residual stenosis  post intervention.   STRESS TESTS  MYOCARDIAL PERFUSION IMAGING 10/11/2017  Narrative  Nuclear stress EF: 38%.  There was no ST segment deviation noted during stress.  Defect 1: There is a large defect of severe severity present in the basal inferior, basal inferolateral, mid inferior and mid inferolateral location.  Defect 2: There is a small defect of moderate severity present in the mid anteroseptal location.  This is an intermediate risk study.  Findings consistent with prior myocardial infarction.  The left ventricular ejection fraction is moderately decreased (30-44%).  Abnormal, intermediate risk stress nuclear study with large prior inferior infarct and small prior anteroseptal infarct.  No ischemia.  Gated ejection fraction 38% with akinesis of the inferior wall.  Moderate left ventricular enlargement.  Study interpreted as intermediate risk due to reduced LV function.  ECHOCARDIOGRAM  ECHOCARDIOGRAM COMPLETE 10/13/2017  Narrative *[facility]* [address] [place], [place] [address] [phone]  ------------------------------------------------------------------- Transthoracic Echocardiography  Patient:    Dixon, Luczak MR #:       [id number] Study Date: 10/13/2017 Gender:     CHRISTELLA  Age:        45 Height:     180.3 cm Weight:     102.1 kg BSA:        2.29 m^2 Pt. Status: Room:  ORDERING     Ozell Fell, MD REFERRING    Ozell Fell, MD ATTENDING    Leim Moose, M.D. PERFORMING   [facility], Outpatient SONOGRAPHER  [facility], RDCS REFERRING    Geofm Hastings J  cc:  ------------------------------------------------------------------- LV EF: 55% -   60%  ------------------------------------------------------------------- Indications:      CAD (I25.10).  ------------------------------------------------------------------- History:   PMH:   Coronary artery disease.  PMH:   Myocardial infarction.  Risk factors:  Family history of  coronary artery disease. Former tobacco use. Hypertension. Dyslipidemia.  ------------------------------------------------------------------- Study Conclusions  - Left ventricle: The cavity size was mildly dilated. Systolic function was normal. The estimated ejection fraction was in the range of 55% to 60%. Wall motion was normal; there were no regional wall motion abnormalities. Doppler parameters are consistent with abnormal left ventricular relaxation (grade 1 diastolic dysfunction). There was no evidence of elevated ventricular filling pressure by Doppler parameters. - Ventricular septum: Septal motion showed paradox. - Aortic valve: There was no regurgitation. - Aortic root: The aortic root was normal in size. - Mitral valve: There was no regurgitation. - Left atrium: The atrium was moderately dilated. - Right ventricle: The cavity size was normal. Wall thickness was normal. Systolic function was normal. - Right atrium: The atrium was normal in size. - Tricuspid valve: There was no regurgitation. - Pulmonic valve: There was no regurgitation. - Pulmonary arteries: Systolic pressure could not be accurately estimated. - Inferior vena cava: The vessel was normal in size. - Pericardium, extracardiac: There was no pericardial effusion.  ------------------------------------------------------------------- Study data:  Comparison was made to the study of 07/31/2015.  Study status:  Routine.  Procedure:  Transthoracic echocardiography. Image quality was adequate.          Transthoracic echocardiography.  M-mode, complete 2D, spectral Doppler, and color Doppler.  Birthdate:  Patient birthdate: 04-17-1950.  Age:  Patient is 73 yr old.  Sex:  Gender: male.    BMI: 31.4 kg/m^2.  Blood pressure:     140/86  Patient status:  Outpatient.  Study date: Study date: 10/13/2017. Study time: 02:11 PM.  Location:  [facility]  -------------------------------------------------------------------  ------------------------------------------------------------------- Left ventricle:  The cavity size was mildly dilated. Systolic function was normal. The estimated ejection fraction was in the range of 55% to 60%. Wall motion was normal; there were no regional wall motion abnormalities. Doppler parameters are consistent with abnormal left ventricular relaxation (grade 1 diastolic dysfunction). There was no evidence of elevated ventricular filling pressure by Doppler parameters.  ------------------------------------------------------------------- Aortic valve:   Trileaflet; normal thickness leaflets. Mobility was not restricted.  Doppler:  Transvalvular velocity was within the normal range. There was no stenosis. There was no regurgitation.  ------------------------------------------------------------------- Aorta:  Aortic root: The aortic root was normal in size.  ------------------------------------------------------------------- Mitral valve:   Structurally normal valve.   Mobility was not restricted.  Doppler:  Transvalvular velocity was within the normal range. There was no evidence for stenosis. There was no regurgitation.  ------------------------------------------------------------------- Left atrium:  The atrium was moderately dilated.  ------------------------------------------------------------------- Right ventricle:  The cavity size was normal. Wall thickness was normal. Systolic function was normal.  ------------------------------------------------------------------- Ventricular septum:   Septal motion showed paradox.  ------------------------------------------------------------------- Pulmonic valve:    Structurally normal valve.  Cusp separation was normal.  Doppler:  Transvalvular velocity was within the normal range. There was no evidence for stenosis. There was  no regurgitation.  ------------------------------------------------------------------- Tricuspid valve:   Structurally normal valve.    Doppler: Transvalvular velocity was within the normal range. There was no regurgitation.  ------------------------------------------------------------------- Pulmonary artery:   The main pulmonary artery was normal-sized. Systolic pressure could not be accurately estimated.  ------------------------------------------------------------------- Right atrium:  The atrium was normal in size.  ------------------------------------------------------------------- Pericardium:  There was no pericardial effusion.  ------------------------------------------------------------------- Systemic veins: Inferior vena cava: The vessel was normal in size.  ------------------------------------------------------------------- Measurements  Left ventricle                         Value        Reference LV ID, ED, PLAX chordal        (H)     53.8  mm     43 - 52 LV ID, ES, PLAX chordal        (H)     48    mm     23 - 38 LV fx shortening, PLAX chordal (L)     11    %      >=29 LV PW thickness, ED                    9.42  mm     ---------- IVS/LV PW ratio, ED                    0.88         <=1.3 Stroke volume, 2D                      88    ml     ---------- Stroke volume/bsa, 2D                  38    ml/m^2 ---------- LV e&', lateral                         8.38  cm/s   ---------- LV E/e&', lateral                       6.48         ---------- LV e&', medial                          4.46  cm/s   ---------- LV E/e&', medial                        12.17        ---------- LV e&', average                         6.42  cm/s   ---------- LV E/e&', average                       8.46         ----------  Ventricular septum                     Value        Reference IVS thickness, ED  8.33  mm     ----------  LVOT                                   Value         Reference LVOT ID, S                             25    mm     ---------- LVOT area                              4.91  cm^2   ---------- LVOT peak velocity, S                  81.3  cm/s   ---------- LVOT mean velocity, S                  54.3  cm/s   ---------- LVOT VTI, S                            17.9  cm     ----------  Aorta                                  Value        Reference Aortic root ID, ED                     30    mm     ---------- Ascending aorta ID, A-P, S             36    mm     ----------  Left atrium                            Value        Reference LA ID, A-P, ES                         49    mm     ---------- LA ID/bsa, A-P                         2.14  cm/m^2 <=2.2 LA volume, S                           76.4  ml     ---------- LA volume/bsa, S                       33.4  ml/m^2 ---------- LA volume, ES, 1-p A4C                 73.6  ml     ---------- LA volume/bsa, ES, 1-p A4C             32.2  ml/m^2 ---------- LA volume, ES, 1-p A2C                 72.9  ml     ---------- LA volume/bsa, ES, 1-p A2C             31.9  ml/m^2 ----------  Mitral valve                           Value        Reference Mitral E-wave peak velocity            54.3  cm/s   ---------- Mitral A-wave peak velocity            85.9  cm/s   ---------- Mitral deceleration time       (H)     232   ms     150 - 230 Mitral E/A ratio, peak                 0.6          ----------  Right atrium                           Value        Reference RA ID, S-I, ES, A4C            (H)     60.8  mm     34 - 49 RA area, ES, A4C               (H)     22    cm^2   8.3 - 19.5 RA volume, ES, A/L                     65.6  ml     ---------- RA volume/bsa, ES, A/L                 28.7  ml/m^2 ----------  Systemic veins                         Value        Reference Estimated CVP                          3     mm Hg  ----------  Right ventricle                        Value        Reference TAPSE                                   26    mm     ---------- RV s&', lateral, S                      16.7  cm/s   ----------  Legend: (L)  and  (H)  mark values outside specified reference range.  ------------------------------------------------------------------- Prepared and Electronically Authenticated by  Leim Moose, M.D. 2019-09-19T15:59:00             EKG:   EKG Interpretation Date/Time:  Monday January 31 2023 15:39:09 EST Ventricular Rate:  82 PR Interval:  206 QRS Duration:  106 QT Interval:  374 QTC Calculation: 436 R Axis:   -29  Text Interpretation: Sinus rhythm with frequent Premature ventricular complexes Inferior infarct (cited on or before 30-Jul-2015) When compared with ECG of 01-Aug-2015 05:09, Premature ventricular complexes are now Present Non-specific change in ST segment in Inferior leads T wave inversion less evident in Inferior leads  Confirmed by Wonda Sharper [phone]) on 01/31/2023 3:49:42 PM    Recent Labs: 10/06/2022: ALT 43; BUN 17; Creatinine, Ser 0.90; Hemoglobin 15.5; Platelets 255.0; Potassium 4.0; Sodium 139  Recent Lipid Panel    Component Value Date/Time   CHOL 105 10/06/2022 0811   TRIG 139.0 10/06/2022 0811   HDL 38.50 (L) 10/06/2022 0811   CHOLHDL 3 10/06/2022 0811   VLDL 27.8 10/06/2022 0811   LDLCALC 38 10/06/2022 0811   LDLCALC 43 08/06/2019 0848   LDLDIRECT 49.0 03/02/2022 0847     Risk Assessment/Calculations:                Physical Exam:    VS:  BP 110/84   Pulse 82   Ht 5' 10 (1.778 m)   Wt 228 lb 9.6 oz (103.7 kg)   SpO2 97%   BMI 32.80 kg/m     Wt Readings from Last 3 Encounters:  01/31/23 228 lb 9.6 oz (103.7 kg)  10/06/22 219 lb 6.4 oz (99.5 kg)  07/26/22 219 lb 8 oz (99.6 kg)     GEN:  Well nourished, well developed in no acute distress HEENT: Normal NECK: No JVD; No carotid bruits LYMPHATICS: No lymphadenopathy CARDIAC: RRR, no murmurs, rubs, gallops RESPIRATORY:  Clear to auscultation without rales,  wheezing or rhonchi  ABDOMEN: Soft, non-tender, non-distended MUSCULOSKELETAL:  No edema; No deformity  SKIN: Warm and dry NEUROLOGIC:  Alert and oriented x 3 PSYCHIATRIC:  Normal affect   Assessment & Plan Coronary artery disease involving native coronary artery of native heart with angina pectoris [facility]) Patient with atypical angina.  Risk factors are well-controlled.  I have recommended a cardiac PET stress test for further evaluation.  He otherwise will continue his current medical program. Essential hypertension Blood pressure well-controlled on amlodipine , losartan , and metoprolol . Mixed hyperlipidemia Last labs 09/2018 with a chol 105, LDL 38, HDL 38.5, and trig 139.  Continue atorvastatin  80 mg daily.    Informed Consent   Shared Decision Making/Informed Consent The risks [chest pain, shortness of breath, cardiac arrhythmias, dizziness, blood pressure fluctuations, myocardial infarction, stroke/transient ischemic attack, nausea, vomiting, allergic reaction, radiation exposure, metallic taste sensation and life-threatening complications (estimated to be 1 in 10,000)], benefits (risk stratification, diagnosing coronary artery disease, treatment guidance) and alternatives of a cardiac PET stress test were discussed in detail with Mike Ray and he agrees to proceed.     Medication Adjustments/Labs and Tests Ordered: Current medicines are reviewed at length with the patient today.  Concerns regarding medicines are outlined above.  Orders Placed This Encounter  Procedures   NM PET CT CARDIAC PERFUSION MULTI W/ABSOLUTE BLOODFLOW   EKG 12-Lead   Meds ordered this encounter  Medications   nitroGLYCERIN  (NITROSTAT ) 0.4 MG SL tablet    Sig: Dissolve 1 tablet under the tongue every 5 minutes as needed for chest pain. Max of 3 doses, then 911    Dispense:  25 tablet    Refill:  3    Patient Instructions  Testing/Procedures: Cardiac PET Stress Test   Follow-Up: At [facility], you and your health needs are our priority.  As part of our continuing mission to provide you with exceptional heart care, we have created designated Provider Care Teams.  These Care Teams include your primary Cardiologist (physician) and Advanced Practice Providers (APPs -  Physician Assistants and Nurse Practitioners) who all work together to provide you with the care you need, when you need it.  We recommend signing up for the  patient portal called MyChart.  Sign up information is provided on this After Visit Summary.  MyChart is used to connect with patients for Virtual Visits (Telemedicine).  Patients are able to view lab/test results, encounter notes, upcoming appointments, etc.  Non-urgent messages can be sent to your provider as well.   To learn more about what you can do with MyChart, go to [email or web address].    Your next appointment:   1 year(s)  Provider:   Ozell Fell, MD     Other Instructions    Please report to Radiology at the [facility] Main Entrance 30 minutes early for your test.  [address] [place], [place] [address]                         OR   Please report to Radiology at [facility] Main Entrance, medical mall, 30 mins prior to your test.  [address]  [place], [place]  [phone]  How to Prepare for Your Cardiac PET/CT Stress Test:  Nothing to eat or drink, except water, 3 hours prior to arrival time.  NO caffeine/decaffeinated products, or chocolate 12 hours prior to arrival. (Please note decaffeinated beverages (teas/coffees) still contain caffeine).  If you have caffeine within 12 hours prior, the test will need to be rescheduled.  Medication instructions: Do not take erectile dysfunction medications for 72 hours prior to test (sildenafil , tadalafil) Do not take nitrates (isosorbide mononitrate, Ranexa) the day before or day of test Do not take tamsulosin the day before or morning of  test Hold theophylline containing medications for 12 hours. Hold Dipyridamole 48 hours prior to the test.  Diabetic Preparation: If able to eat breakfast prior to 3 hour fasting, you may take all medications, including your insulin. Do not worry if you miss your breakfast dose of insulin - start at your next meal. If you do not eat prior to 3 hour fast-Hold all diabetes (oral and insulin) medications. Patients who wear a continuous glucose monitor MUST remove the device prior to scanning.  You may take your remaining medications with water.  NO perfume, cologne or lotion on chest or abdomen area. FEMALES - Please avoid wearing dresses to this appointment.  Total time is 1 to 2 hours; you may want to bring reading material for the waiting time.  IF YOU THINK YOU MAY BE PREGNANT, OR ARE NURSING PLEASE INFORM THE TECHNOLOGIST.  In preparation for your appointment, medication and supplies will be purchased.  Appointment availability is limited, so if you need to cancel or reschedule, please call the Radiology Department at [phone] [facility]) OR [phone] [facility]) 24 hours in advance to avoid a cancellation fee of $100.00  What to Expect When you Arrive:  Once you arrive and check in for your appointment, you will be taken to a preparation room within the Radiology Department.  A technologist or Nurse will obtain your medical history, verify that you are correctly prepped for the exam, and explain the procedure.  Afterwards, an IV will be started in your arm and electrodes will be placed on your skin for EKG monitoring during the stress portion of the exam. Then you will be escorted to the PET/CT scanner.  There, staff will get you positioned on the scanner and obtain a blood pressure and EKG.  During the exam, you will continue to be connected to the EKG and blood pressure machines.  A small, safe amount of a  radioactive tracer will be injected in your IV to obtain a series of pictures of  your heart along with an injection of a stress agent.    After your Exam:  It is recommended that you eat a meal and drink a caffeinated beverage to counter act any effects of the stress agent.  Drink plenty of fluids for the remainder of the day and urinate frequently for the first couple of hours after the exam.  Your doctor will inform you of your test results within 7-10 business days.  For more information and frequently asked questions, please visit our website: [email or web address]  For questions about your test or how to prepare for your test, please call: Cardiac Imaging Nurse Navigators Office: [phone]        Signed, Ozell Fell, MD  01/31/2023 5:27 PM    [facility]

## 2023-01-31 NOTE — Assessment & Plan Note (Addendum)
 Last labs 09/2018 with a chol 105, LDL 38, HDL 38.5, and trig 139.  Continue atorvastatin 80 mg daily.

## 2023-01-31 NOTE — Assessment & Plan Note (Signed)
 Patient with atypical angina.  Risk factors are well-controlled.  I have recommended a cardiac PET stress test for further evaluation.  He otherwise will continue his current medical program.

## 2023-01-31 NOTE — Patient Instructions (Signed)
 Testing/Procedures: Cardiac PET Stress Test   Follow-Up: At [facility], you and your health needs are our priority.  As part of our continuing mission to provide you with exceptional heart care, we have created designated Provider Care Teams.  These Care Teams include your primary Cardiologist (physician) and Advanced Practice Providers (APPs -  Physician Assistants and Nurse Practitioners) who all work together to provide you with the care you need, when you need it.  We recommend signing up for the patient portal called MyChart.  Sign up information is provided on this After Visit Summary.  MyChart is used to connect with patients for Virtual Visits (Telemedicine).  Patients are able to view lab/test results, encounter notes, upcoming appointments, etc.  Non-urgent messages can be sent to your provider as well.   To learn more about what you can do with MyChart, go to [email or web address].    Your next appointment:   1 year(s)  Provider:   Ozell Fell, MD     Other Instructions    Please report to Radiology at the [facility] Main Entrance 30 minutes early for your test.  [address] [place], [place] [address]                         OR   Please report to Radiology at [facility] Main Entrance, medical mall, 30 mins prior to your test.  [address]  [place], [place]  [phone]  How to Prepare for Your Cardiac PET/CT Stress Test:  Nothing to eat or drink, except water, 3 hours prior to arrival time.  NO caffeine/decaffeinated products, or chocolate 12 hours prior to arrival. (Please note decaffeinated beverages (teas/coffees) still contain caffeine).  If you have caffeine within 12 hours prior, the test will need to be rescheduled.  Medication instructions: Do not take erectile dysfunction medications for 72 hours prior to test (sildenafil , tadalafil) Do not take nitrates (isosorbide mononitrate, Ranexa) the day  before or day of test Do not take tamsulosin the day before or morning of test Hold theophylline containing medications for 12 hours. Hold Dipyridamole 48 hours prior to the test.  Diabetic Preparation: If able to eat breakfast prior to 3 hour fasting, you may take all medications, including your insulin. Do not worry if you miss your breakfast dose of insulin - start at your next meal. If you do not eat prior to 3 hour fast-Hold all diabetes (oral and insulin) medications. Patients who wear a continuous glucose monitor MUST remove the device prior to scanning.  You may take your remaining medications with water.  NO perfume, cologne or lotion on chest or abdomen area. FEMALES - Please avoid wearing dresses to this appointment.  Total time is 1 to 2 hours; you may want to bring reading material for the waiting time.  IF YOU THINK YOU MAY BE PREGNANT, OR ARE NURSING PLEASE INFORM THE TECHNOLOGIST.  In preparation for your appointment, medication and supplies will be purchased.  Appointment availability is limited, so if you need to cancel or reschedule, please call the Radiology Department at [phone] [facility]) OR [phone] [facility]) 24 hours in advance to avoid a cancellation fee of $100.00  What to Expect When you Arrive:  Once you arrive and check in for your appointment, you will be taken to a preparation room within the Radiology Department.  A technologist or Nurse will obtain your medical history, verify that you are correctly prepped  for the exam, and explain the procedure.  Afterwards, an IV will be started in your arm and electrodes will be placed on your skin for EKG monitoring during the stress portion of the exam. Then you will be escorted to the PET/CT scanner.  There, staff will get you positioned on the scanner and obtain a blood pressure and EKG.  During the exam, you will continue to be connected to the EKG and blood pressure machines.  A small, safe amount of a  radioactive tracer will be injected in your IV to obtain a series of pictures of your heart along with an injection of a stress agent.    After your Exam:  It is recommended that you eat a meal and drink a caffeinated beverage to counter act any effects of the stress agent.  Drink plenty of fluids for the remainder of the day and urinate frequently for the first couple of hours after the exam.  Your doctor will inform you of your test results within 7-10 business days.  For more information and frequently asked questions, please visit our website: [email or web address]  For questions about your test or how to prepare for your test, please call: Cardiac Imaging Nurse Navigators Office: [phone]

## 2023-02-15 ENCOUNTER — Other Ambulatory Visit: Payer: Self-pay | Admitting: Cardiovascular Disease

## 2023-03-01 ENCOUNTER — Other Ambulatory Visit: Payer: Self-pay | Admitting: Internal Medicine

## 2023-03-14 ENCOUNTER — Other Ambulatory Visit: Payer: Self-pay | Admitting: Cardiovascular Disease

## 2023-03-15 NOTE — Telephone Encounter (Signed)
 Pt's pharmacy is requesting a refill on sildenafil. Would Dr. Excell Seltzer like to refill this non cardiac medication? Please address

## 2023-03-17 NOTE — Telephone Encounter (Signed)
 Pt seen in office 01/2023 with recommended follow up for 1 year. Refill sent to pharmacy at this time.

## 2023-03-19 ENCOUNTER — Other Ambulatory Visit: Payer: Self-pay | Admitting: Internal Medicine

## 2023-03-21 ENCOUNTER — Encounter (HOSPITAL_COMMUNITY): Payer: Self-pay

## 2023-03-23 ENCOUNTER — Encounter (HOSPITAL_COMMUNITY)
Admission: RE | Admit: 2023-03-23 | Discharge: 2023-03-23 | Disposition: A | Discharge status: Home / Self Care | Payer: Medicare Other | Source: Ambulatory Visit | Attending: Cardiovascular Disease | Admitting: Cardiovascular Disease

## 2023-03-23 ENCOUNTER — Encounter (HOSPITAL_COMMUNITY): Payer: Self-pay

## 2023-03-23 DIAGNOSIS — I10 Essential (primary) hypertension: Secondary | ICD-10-CM | POA: Diagnosis not present

## 2023-03-23 DIAGNOSIS — I25119 Atherosclerotic heart disease of native coronary artery with unspecified angina pectoris: Secondary | ICD-10-CM | POA: Insufficient documentation

## 2023-03-23 DIAGNOSIS — E782 Mixed hyperlipidemia: Secondary | ICD-10-CM | POA: Diagnosis not present

## 2023-03-23 MED ORDER — RUBIDIUM RB82 GENERATOR (RUBYFILL)
20.9000 | PACK | Freq: Once | INTRAVENOUS | Status: AC
  Administered 2023-03-23: 26.85 via INTRAVENOUS

## 2023-03-23 MED ORDER — REGADENOSON 0.4 MG/5ML IV SOLN
0.4000 mg | Freq: Once | INTRAVENOUS | Status: AC
  Administered 2023-03-23: 0.4 mg via INTRAVENOUS

## 2023-03-23 MED ORDER — REGADENOSON 0.4 MG/5ML IV SOLN
INTRAVENOUS | Status: AC
  Filled 2023-03-23: qty 5

## 2023-03-23 MED ORDER — RUBIDIUM RB82 GENERATOR (RUBYFILL)
20.9000 | PACK | Freq: Once | INTRAVENOUS | Status: AC
  Administered 2023-03-23: 26.87 via INTRAVENOUS

## 2023-03-24 LAB — NM PET CT CARDIAC PERFUSION MULTI W/ABSOLUTE BLOODFLOW
LV dias vol: 182 mL (ref 62–150)
LV sys vol: 113 mL
MBFR: 2.57
Nuc Rest EF: 32 %
Nuc Stress EF: 38 %
Peak HR: 87 {beats}/min
Rest HR: 75 {beats}/min
Rest MBF: 0.74 ml/g/min
Rest Nuclear Isotope Dose: 26.9 mCi
ST Depression (mm): 0 mm
Stress MBF: 1.9 ml/g/min
Stress Nuclear Isotope Dose: 26.9 mCi
TID: 0.98

## 2023-03-25 ENCOUNTER — Telehealth: Payer: Self-pay

## 2023-03-25 DIAGNOSIS — R931 Abnormal findings on diagnostic imaging of heart and coronary circulation: Secondary | ICD-10-CM

## 2023-03-25 DIAGNOSIS — I25119 Atherosclerotic heart disease of native coronary artery with unspecified angina pectoris: Secondary | ICD-10-CM

## 2023-03-25 NOTE — Telephone Encounter (Signed)
-----   Message from Tonny Bollman sent at 03/24/2023  5:34 PM EST ----- Result reviewed.  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  No major ischemia noted.  Medical management is appropriate.  Please order 2D echocardiogram to better assess LVEF.  This study suggest his LVEF is significantly reduced, but I think an echo will provide a more accurate assessment.

## 2023-03-25 NOTE — Telephone Encounter (Signed)
 Order placed at this time for ECHO. Called and left detailed message per [facility] informing pt of Dr Earmon Phoenix comments on PET scan and need to do ECHO to assess LV function. Advised that pt call back if questions.

## 2023-03-26 ENCOUNTER — Other Ambulatory Visit: Payer: Self-pay | Admitting: Internal Medicine

## 2023-04-04 ENCOUNTER — Encounter: Payer: Self-pay | Admitting: Internal Medicine

## 2023-04-04 NOTE — Progress Notes (Unsigned)
 Subjective:    Patient ID: Mike Ray, male    DOB: 1950-10-31, 73 y.o.   MRN: [id number]     HPI Mike Ray is here for follow up of his chronic medical problems.  No concerns.  Has had a little depression over the past year - related to aging and death of a close friend.   Swimming 2/week.  Walking 1.5 miles.    Stress test recently.  Scheduled to have an echocardiogram.  Not eating as well as he should-he does monitor his sugars but still eats them.  He is not careful about the amount that he is eating.  Medications and allergies reviewed with patient and updated if appropriate.  Current Outpatient Medications on File Prior to Visit  Medication Sig Dispense Refill   amLODipine (NORVASC) 10 MG tablet Take 1 tablet by mouth once daily 90 tablet 0   Ascorbic Acid (SUPER C COMPLEX PO) Take by mouth 2 (two) times daily.     aspirin 81 MG tablet Take 81 mg by mouth daily.     atorvastatin (LIPITOR) 80 MG tablet TAKE 1 TABLET BY MOUTH ONCE DAILY IN THE EVENING 90 tablet 1   calcium carbonate (TUMS - DOSED IN MG ELEMENTAL CALCIUM) 500 MG chewable tablet Chew 1 tablet by mouth daily as needed.     Coenzyme Q10 (COQ10) 100 MG CAPS Take 1 capsule by mouth daily.     famotidine (PEPCID) 20 MG tablet Take 20 mg by mouth daily as needed for heartburn or indigestion.     losartan (COZAAR) 100 MG tablet TAKE 1 TABLET BY MOUTH EVERY DAY 90 tablet 3   metoprolol tartrate (LOPRESSOR) 25 MG tablet Take 1/2 (one-half) tablet by mouth twice daily 90 tablet 0   Multiple Vitamin (MULTIVITAMIN) tablet Take 1 tablet by mouth every other day.      nitroGLYCERIN (NITROSTAT) 0.4 MG SL tablet Dissolve 1 tablet under the tongue every 5 minutes as needed for chest pain. Max of 3 doses, then 911 25 tablet 3   sildenafil (REVATIO) 20 MG tablet TAKE 2 TO 5 TABLETS BY MOUTH AS NEEDED FOR ERECTILE DYSFUNCTION. DO NOT USE WITH NITROGLYCERIN 90 tablet 6   No current facility-administered medications on  file prior to visit.     Review of Systems  Constitutional:  Negative for fever.  Respiratory:  Negative for cough, shortness of breath and wheezing.   Cardiovascular:  Negative for chest pain (occ - transient), palpitations and leg swelling.  Neurological:  Positive for dizziness (occ if gets up fast - once every 2 months). Negative for light-headedness and headaches.       Objective:   Vitals:   04/05/23 0855  BP: 112/70  Pulse: 72  Temp: 98.4 F (36.9 C)  SpO2: 96%   BP Readings from Last 3 Encounters:  04/05/23 112/70  03/23/23 104/65  01/31/23 110/84   Wt Readings from Last 3 Encounters:  04/05/23 227 lb (103 kg)  01/31/23 228 lb 9.6 oz (103.7 kg)  10/06/22 219 lb 6.4 oz (99.5 kg)   Body mass index is 32.57 kg/m.    Physical Exam Constitutional:      General: He is not in acute distress.    Appearance: Normal appearance. He is not ill-appearing.  HENT:     Head: Normocephalic and atraumatic.  Eyes:     Conjunctiva/sclera: Conjunctivae normal.  Cardiovascular:     Rate and Rhythm: Normal rate and regular rhythm.     Heart  sounds: Normal heart sounds.  Pulmonary:     Effort: Pulmonary effort is normal. No respiratory distress.     Breath sounds: Normal breath sounds. No wheezing or rales.  Musculoskeletal:     Right lower leg: No edema.     Left lower leg: No edema.  Skin:    General: Skin is warm and dry.     Findings: No rash.  Neurological:     Mental Status: He is alert. Mental status is at baseline.  Psychiatric:        Mood and Affect: Mood normal.        Lab Results  Component Value Date   WBC 5.7 10/06/2022   HGB 15.5 10/06/2022   HCT 46.0 10/06/2022   PLT 255.0 10/06/2022   GLUCOSE 115 (H) 10/06/2022   CHOL 105 10/06/2022   TRIG 139.0 10/06/2022   HDL 38.50 (L) 10/06/2022   LDLDIRECT 49.0 03/02/2022   LDLCALC 38 10/06/2022   ALT 43 10/06/2022   AST 33 10/06/2022   NA 139 10/06/2022   K 4.0 10/06/2022   CL 104 10/06/2022    CREATININE 0.90 10/06/2022   BUN 17 10/06/2022   CO2 26 10/06/2022   TSH 1.86 02/09/2021   PSA 2.37 08/06/2019   INR 1.20 07/30/2015   HGBA1C 6.2 10/06/2022     Assessment & Plan:    See Problem List for Assessment and Plan of chronic medical problems.

## 2023-04-04 NOTE — Patient Instructions (Addendum)
      Blood work was ordered.       Medications changes include :   None    A referral was ordered and someone will call you to schedule an appointment.     Return in about 6 months (around 10/06/2023) for follow up.

## 2023-04-05 ENCOUNTER — Encounter: Payer: Self-pay | Admitting: Internal Medicine

## 2023-04-05 ENCOUNTER — Ambulatory Visit (INDEPENDENT_AMBULATORY_CARE_PROVIDER_SITE_OTHER): Payer: Medicare Other | Admitting: Internal Medicine

## 2023-04-05 VITALS — BP 112/70 | HR 72 | Temp 98.4°F | Ht 70.0 in | Wt 227.0 lb

## 2023-04-05 DIAGNOSIS — Z6832 Body mass index (BMI) 32.0-32.9, adult: Secondary | ICD-10-CM | POA: Diagnosis not present

## 2023-04-05 DIAGNOSIS — I10 Essential (primary) hypertension: Secondary | ICD-10-CM

## 2023-04-05 DIAGNOSIS — R7303 Prediabetes: Secondary | ICD-10-CM | POA: Diagnosis not present

## 2023-04-05 DIAGNOSIS — I2510 Atherosclerotic heart disease of native coronary artery without angina pectoris: Secondary | ICD-10-CM | POA: Diagnosis not present

## 2023-04-05 DIAGNOSIS — E782 Mixed hyperlipidemia: Secondary | ICD-10-CM

## 2023-04-05 DIAGNOSIS — E66811 Obesity, class 1: Secondary | ICD-10-CM | POA: Diagnosis not present

## 2023-04-05 LAB — LIPID PANEL
Cholesterol: 100 mg/dL (ref 0–200)
HDL: 33.8 mg/dL — ABNORMAL LOW (ref >39.00)
LDL Cholesterol: 32 mg/dL (ref 0–99)
NonHDL: 66.52
Total CHOL/HDL Ratio: 3
Triglycerides: 173 mg/dL — ABNORMAL HIGH (ref 0.0–149.0)
VLDL: 34.6 mg/dL (ref 0.0–40.0)

## 2023-04-05 LAB — COMPREHENSIVE METABOLIC PANEL
ALT: 32 U/L (ref 0–53)
AST: 28 U/L (ref 0–37)
Albumin: 4.8 g/dL (ref 3.5–5.2)
Alkaline Phosphatase: 57 U/L (ref 39–117)
BUN: 15 mg/dL (ref 6–23)
CO2: 28 meq/L (ref 19–32)
Calcium: 10 mg/dL (ref 8.4–10.5)
Chloride: 102 meq/L (ref 96–112)
Creatinine, Ser: 0.91 mg/dL (ref 0.40–1.50)
GFR: 83.98 mL/min (ref >60.00)
Glucose, Bld: 132 mg/dL — ABNORMAL HIGH (ref 70–99)
Potassium: 4.3 meq/L (ref 3.5–5.1)
Sodium: 138 meq/L (ref 135–145)
Total Bilirubin: 1.4 mg/dL — ABNORMAL HIGH (ref 0.2–1.2)
Total Protein: 7.2 g/dL (ref 6.0–8.3)

## 2023-04-05 LAB — HEMOGLOBIN A1C: Hgb A1c MFr Bld: 6.3 % (ref 4.6–6.5)

## 2023-04-05 NOTE — Assessment & Plan Note (Signed)
Chronic Regular exercise and healthy diet encouraged Check lipid panel  Continue atorvastatin 80 mg daily 

## 2023-04-05 NOTE — Assessment & Plan Note (Addendum)
 Chronic Lab Results  Component Value Date   HGBA1C 6.2 10/06/2022   Check a1c Low sugar / carb diet Stressed regular exercise Encouraged weight loss

## 2023-04-05 NOTE — Assessment & Plan Note (Signed)
Chronic Follows with cardiology No symptoms consistent with angina Continue atorvastatin 80 mg daily, aspirin 81 mg daily Blood pressure well-controlled Continue regular exercise, healthy diet

## 2023-04-05 NOTE — Assessment & Plan Note (Signed)
 Chronic BMI in the obesity range Encouraged weight loss I discussed all of the concerns with his weight Continue regular exercise-increase walking if possible Decrease portions-do some calorie counting Continue to monitor sugars

## 2023-04-05 NOTE — Assessment & Plan Note (Signed)
 Chronic BP well controlled Cmp Continue amlodipine 10 mg daily, losartan 100 mg daily, metoprolol 12.5 mg bid

## 2023-04-14 ENCOUNTER — Ambulatory Visit (HOSPITAL_COMMUNITY): Payer: Medicare Other | Attending: Cardiovascular Disease

## 2023-04-14 DIAGNOSIS — I25119 Atherosclerotic heart disease of native coronary artery with unspecified angina pectoris: Secondary | ICD-10-CM | POA: Insufficient documentation

## 2023-04-14 DIAGNOSIS — R931 Abnormal findings on diagnostic imaging of heart and coronary circulation: Secondary | ICD-10-CM | POA: Diagnosis not present

## 2023-04-14 LAB — ECHOCARDIOGRAM COMPLETE
Area-P 1/2: 3.28 cm2
P 1/2 time: 530 ms
S' Lateral: 4.9 cm

## 2023-06-01 ENCOUNTER — Other Ambulatory Visit: Payer: Self-pay | Admitting: Internal Medicine

## 2023-06-06 ENCOUNTER — Ambulatory Visit (INDEPENDENT_AMBULATORY_CARE_PROVIDER_SITE_OTHER): Payer: Medicare Other

## 2023-06-06 VITALS — Ht 70.0 in | Wt 215.0 lb

## 2023-06-06 DIAGNOSIS — K635 Polyp of colon: Secondary | ICD-10-CM | POA: Diagnosis not present

## 2023-06-06 DIAGNOSIS — Z800 Family history of malignant neoplasm of digestive organs: Secondary | ICD-10-CM | POA: Diagnosis not present

## 2023-06-06 DIAGNOSIS — Z0000 Encounter for general adult medical examination without abnormal findings: Secondary | ICD-10-CM | POA: Diagnosis not present

## 2023-06-06 NOTE — Patient Instructions (Signed)
 Mr. Mike Ray , Thank you for taking time out of your busy schedule to complete your Annual Wellness Visit with me. I enjoyed our conversation and look forward to speaking with you again next year. I, as well as your care team,  appreciate your ongoing commitment to your health goals. Please review the following plan we discussed and let me know if I can assist you in the future. Your Game plan/ To Do List    Referrals: If you haven't heard from the office you've been referred to, please reach out to them at the phone provided.  Referral to Dr Karene Oto for a repeat Colonoscopy Follow up Visits: Next Medicare AWV with our clinical staff: 06/11/2024   Have you seen your provider in the last 6 months (3 months if uncontrolled diabetes)? Yes Next Office Visit with your provider: Patient to call office to schedule  Clinician Recommendations:  Aim for 30 minutes of exercise or brisk walking, 6-8 glasses of water, and 5 servings of fruits and vegetables each day. Educated an advised on getting the COVID and Tdap (Tetenus) vaccines in 2025.      This is a list of the screening recommended for you and due dates:  Health Maintenance  Topic Date Due   DTaP/Tdap/Td vaccine (2 - Td or Tdap) 05/30/2022   COVID-19 Vaccine (6 - 2024-25 season) 09/26/2022   Colon Cancer Screening  03/13/2023   Flu Shot  08/26/2023   Medicare Annual Wellness Visit  06/05/2024   Pneumonia Vaccine  Completed   Hepatitis C Screening  Completed   HPV Vaccine  Aged Out   Meningitis B Vaccine  Aged Out   Zoster (Shingles) Vaccine  Discontinued    Advanced directives: (Provided) Advance directive discussed with you today. I have provided a copy for you to complete at home and have notarized. Once this is complete, please bring a copy in to our office so we can scan it into your chart.  Advance Care Planning is important because it:  [x]  Makes sure you receive the medical care that is consistent with your values, goals, and  preferences  [x]  It provides guidance to your family and loved ones and reduces their decisional burden about whether or not they are making the right decisions based on your wishes.  Follow the link provided in your after visit summary or read over the paperwork we have mailed to you to help you started getting your Advance Directives in place. If you need assistance in completing these, please reach out to us  so that we can help you!

## 2023-06-06 NOTE — Progress Notes (Signed)
 Subjective:   Mike Ray is a 73 y.o. who presents for a Medicare Wellness preventive visit.  As a reminder, Annual Wellness Visits don't include a physical exam, and some assessments may be limited, especially if this visit is performed virtually. We may recommend an in-person visit if needed.  Visit Complete: Virtual I connected with  Mike Ray on 06/06/23 by a audio enabled telemedicine application and verified that I am speaking with the correct person using two identifiers.  Patient Location: Home  Provider Location: Office/Clinic  I discussed the limitations of evaluation and management by telemedicine. The patient expressed understanding and agreed to proceed.  Vital Signs: Because this visit was a virtual/telehealth visit, some criteria may be missing or patient reported. Any vitals not documented were not able to be obtained and vitals that have been documented are patient reported.  VideoDeclined- This patient declined Librarian, academic. Therefore the visit was completed with audio only.  Persons Participating in Visit: Patient.  AWV Questionnaire: No: Patient Medicare AWV questionnaire was not completed prior to this visit.  Cardiac Risk Factors include: advanced age (>56men, >62 women);dyslipidemia;hypertension;obesity (BMI >30kg/m2);male gender     Objective:     Today's Vitals   06/06/23 0809  Weight: 215 lb (97.5 kg)  Height: 5\' 10"  (1.778 m)   Body mass index is 30.85 kg/m.     06/06/2023    8:08 AM 06/02/2022    8:49 AM 04/24/2021    8:55 AM 03/26/2020    8:23 AM 01/11/2018    8:14 AM 10/19/2017    6:23 AM 10/14/2017   11:31 AM  Advanced Directives  Does Patient Have a Medical Advance Directive? Yes Yes Yes Yes Yes Yes Yes  Type of Estate agent of [place];Living will Healthcare Power of [place];Living will Healthcare Power of [place];Living will Healthcare Power of [place];Living will  Healthcare Power of [place];Living will Living will Living will  Does patient want to make changes to medical advance directive?    No - Patient declined  No - Patient declined No - Patient declined  Copy of Healthcare Power of Attorney in Chart? No - copy requested No - copy requested No - copy requested No - copy requested No - copy requested No - copy requested No - copy requested    Current Medications (verified) Outpatient Encounter Medications as of 06/06/2023  Medication Sig   amLODipine  (NORVASC ) 10 MG tablet Take 1 tablet by mouth once daily   Ascorbic Acid (SUPER C COMPLEX PO) Take by mouth 2 (two) times daily.   aspirin  81 MG tablet Take 81 mg by mouth daily.   atorvastatin  (LIPITOR ) 80 MG tablet TAKE 1 TABLET BY MOUTH ONCE DAILY IN THE EVENING   calcium  carbonate (TUMS - DOSED IN MG ELEMENTAL CALCIUM ) 500 MG chewable tablet Chew 1 tablet by mouth daily as needed.   Coenzyme Q10 (COQ10) 100 MG CAPS Take 1 capsule by mouth daily.   famotidine (PEPCID) 20 MG tablet Take 20 mg by mouth daily as needed for heartburn or indigestion.   losartan  (COZAAR ) 100 MG tablet TAKE 1 TABLET BY MOUTH EVERY DAY   metoprolol  tartrate (LOPRESSOR ) 25 MG tablet Take 1/2 (one-half) tablet by mouth twice daily   Multiple Vitamin (MULTIVITAMIN) tablet Take 1 tablet by mouth every other day.    nitroGLYCERIN  (NITROSTAT ) 0.4 MG SL tablet Dissolve 1 tablet under the tongue every 5 minutes as needed for chest pain. Max of 3 doses, then 911  sildenafil  (REVATIO ) 20 MG tablet TAKE 2 TO 5 TABLETS BY MOUTH AS NEEDED FOR ERECTILE DYSFUNCTION. DO NOT USE WITH NITROGLYCERIN    No facility-administered encounter medications on file as of 06/06/2023.    Allergies (verified) Patient has no known allergies.   History: Past Medical History:  Diagnosis Date   Acute MI, inferolateral wall, initial episode of care (HCC) 07/30/2015   Coronary artery disease    Elevated PSA    Dr Alphonza Jefferson   Gilbert's syndrome    History  of heat stroke 2009   History of nephrolithiasis    Hyperlipidemia    NMR 06/2009: LDL 88(1345/912), HDL 40, TG 127. Framingham Study LDL goal =< 130    Hypertension    Osteoarthritis of left hip    hip replacement right and left   Past Surgical History:  Procedure Laterality Date   CARDIAC CATHETERIZATION     CARDIAC CATHETERIZATION N/A 07/30/2015   Procedure: Left Heart Cath and Coronary Angiography;  Surgeon: Arnoldo Lapping, MD;  Location: [facility] INVASIVE CV LAB;  Service: Cardiovascular;  Laterality: N/A;   CARDIAC CATHETERIZATION N/A 07/30/2015   Procedure: Coronary Stent Intervention;  Surgeon: Arnoldo Lapping, MD;  Location: [facility] INVASIVE CV LAB;  Service: Cardiovascular;  Laterality: N/A;  Distal RCA- Promus 3.50x16   COLONOSCOPY  2011   negative   INGUINAL HERNIA REPAIR Right 10/19/2017   Procedure: RIGHT  INGUINAL HERNIA REPAIR ERAS PATHWAY;  Surgeon: Sim Dryer, MD;  Location: [facility];  Service: General;  Laterality: Right;  Tap block   INSERTION OF MESH Right 10/19/2017   Procedure: INSERTION OF MESH;  Surgeon: Sim Dryer, MD;  Location: [facility];  Service: General;  Laterality: Right;   LITHOTRIPSY  2009   Dr.Duckett, [place]    TOTAL HIP ARTHROPLASTY  2008   left   TOTAL HIP ARTHROPLASTY  2004   right   Family History  Problem Relation Age of Onset   Diabetes Mother    Hypertension Mother    Heart attack Father 52       smoker   Prostate cancer Brother 76       CAD; stents @ 56   Hypertension Brother    Heart attack Brother 40   Coronary artery disease Brother        stents late 86s   Stroke Neg Hx    Colon cancer Neg Hx    Colon polyps Neg Hx    Esophageal cancer Neg Hx    Rectal cancer Neg Hx    Stomach cancer Neg Hx    Social History   Socioeconomic History   Marital status: Married    Spouse name: Not on file   Number of children: 3   Years of education: Not on file   Highest education level: Not on file   Occupational History   Occupation: Surveyor, minerals  Tobacco Use   Smoking status: Former    Current packs/day: 0.00    Average packs/day: 1 pack/day for 32.0 years (32.0 ttl pk-yrs)    Types: Cigarettes    Start date: 64    Quit date: 01/26/2000    Years since quitting: 23.3    Passive exposure: Past   Smokeless tobacco: Never   Tobacco comments:    Pt stopped a few times.  Vaping Use   Vaping status: Never Used  Substance and Sexual Activity   Alcohol use: Yes    Alcohol/week: 14.0 standard drinks of alcohol    Types: 14 Cans  of beer per week    Comment: 14 beers/week   Drug use: No   Sexual activity: Yes    Birth control/protection: Other-see comments  Other Topics Concern   Not on file  Social History Narrative   Married   Social Drivers of Health   Financial Resource Strain: Low Risk  (06/06/2023)   Overall Financial Resource Strain (CARDIA)    Difficulty of Paying Living Expenses: Not hard at all  Food Insecurity: No Food Insecurity (06/06/2023)   Hunger Vital Sign    Worried About Running Out of Food in the Last Year: Never true    Ran Out of Food in the Last Year: Never true  Transportation Needs: No Transportation Needs (06/06/2023)   PRAPARE - Administrator, Civil Service (Medical): No    Lack of Transportation (Non-Medical): No  Physical Activity: Insufficiently Active (06/06/2023)   Exercise Vital Sign    Days of Exercise per Week: 3 days    Minutes of Exercise per Session: 40 min  Stress: No Stress Concern Present (06/06/2023)   [employer] of Occupational Health - Occupational Stress Questionnaire    Feeling of Stress : Not at all  Social Connections: Socially Integrated (06/06/2023)   Social Connection and Isolation Panel [NHANES]    Frequency of Communication with Friends and Family: More than three times a week    Frequency of Social Gatherings with Friends and Family: More than three times a week    Attends Religious Services: More than 4  times per year    Active Member of [employer] or Organizations: Yes    Attends Engineer, structural: More than 4 times per year    Marital Status: Married    Tobacco Counseling Counseling given: No Tobacco comments: Pt stopped a few times.    Clinical Intake:  Pre-visit preparation completed: Yes  Pain : No/denies pain     BMI - recorded: 30.85 Nutritional Risks: None Diabetes: No  Lab Results  Component Value Date   HGBA1C 6.3 04/05/2023   HGBA1C 6.2 10/06/2022   HGBA1C 6.2 03/02/2022     How often do you need to have someone help you when you read instructions, pamphlets, or other written materials from your doctor or pharmacy?: 1 - Never  Interpreter Needed?: No  Information entered by :: Kandy Orris, CMA   Activities of Daily Living     06/06/2023    8:10 AM  In your present state of health, do you have any difficulty performing the following activities:  Hearing? 0  Vision? 0  Difficulty concentrating or making decisions? 0  Walking or climbing stairs? 0  Dressing or bathing? 0  Doing errands, shopping? 0  Preparing Food and eating ? N  Using the Toilet? N  In the past six months, have you accidently leaked urine? Y  Comment minor - no need to wear a depend or Urology referral per pt  Do you have problems with loss of bowel control? N  Managing your Medications? N  Managing your Finances? N  Housekeeping or managing your Housekeeping? N    Patient Care Team: Colene Dauphin, MD as PCP - General (Internal Medicine) Arnoldo Lapping, MD as PCP - Cardiology (Cardiology) [facility] as Consulting Physician (Optometry)  Indicate any recent Medical Services you may have received from other than [facility] providers in the past year (date may be approximate).     Assessment:    This is a routine wellness examination for [place].  Hearing/Vision screen Hearing Screening - Comments:: Denies hearing difficulties   Vision Screening - Comments::  Wears eyeglasses - up to date with routine eye exams with [facility]   Goals Addressed               This Visit's Progress     Patient Stated (pt-stated)        Patient stated he plans to continue counting calories with exercising.        Depression Screen     06/06/2023    8:15 AM 04/05/2023    9:00 AM 10/06/2022    7:49 AM 07/19/2022    8:38 AM 06/02/2022    8:53 AM 04/08/2022    8:15 AM 03/02/2022    8:19 AM  PHQ 2/9 Scores  PHQ - 2 Score 0 0 0 1 0 0 0  PHQ- 9 Score 1  0 2 0 0 0    Fall Risk     06/06/2023    8:12 AM 04/05/2023    9:00 AM 10/06/2022    7:49 AM 07/19/2022    8:38 AM 06/02/2022    8:51 AM  Fall Risk   Falls in the past year? 1 0 0 0 0  Number falls in past yr: 0 0 0 0 0  Comment 1      Injury with Fall? 0 0 0 0 0  Risk for fall due to :  No Fall Risks No Fall Risks No Fall Risks No Fall Risks  Follow up Falls evaluation completed;Falls prevention discussed Falls evaluation completed Falls evaluation completed Falls evaluation completed Falls prevention discussed    MEDICARE RISK AT HOME:  Medicare Risk at Home Any stairs in or around the home?: Yes If so, are there any without handrails?: No Home free of loose throw rugs in walkways, pet beds, electrical cords, etc?: Yes Adequate lighting in your home to reduce risk of falls?: Yes Life alert?: No Use of a cane, walker or w/c?: No Grab bars in the bathroom?: Yes Shower chair or bench in shower?: No Elevated toilet seat or a handicapped toilet?: No  TIMED UP AND GO:  Was the test performed?  No  Cognitive Function: 6CIT completed        06/06/2023    8:14 AM 06/02/2022    8:53 AM  6CIT Screen  What Year? 0 points 0 points  What month? 0 points 0 points  What time? 0 points 0 points  Count back from 20 0 points 0 points  Months in reverse 0 points 0 points  Repeat phrase 0 points 0 points  Total Score 0 points 0 points    Immunizations Immunization History  Administered Date(s)  Administered   Influenza, High Dose Seasonal PF 10/25/2017, 11/18/2017, 11/13/2021   Influenza,inj,Quad PF,6+ Mos 11/09/2018   Influenza,inj,quad, With Preservative 11/04/2019   Influenza-Unspecified 10/26/2015, 11/08/2016, 12/09/2020   PFIZER(Purple Top)SARS-COV-2 Vaccination 03/20/2019, 04/10/2019, 11/22/2019, 08/11/2020, 12/22/2021   PNEUMOCOCCAL CONJUGATE-20 11/13/2021   Pneumococcal Conjugate-13 01/05/2016   Pneumococcal Polysaccharide-23 01/10/2017   Tdap 05/29/2012   Zoster Recombinant(Shingrix) 02/19/2020, 07/19/2020   Zoster, Live 05/01/2012    Screening Tests Health Maintenance  Topic Date Due   DTaP/Tdap/Td (2 - Td or Tdap) 05/30/2022   COVID-19 Vaccine (6 - 2024-25 season) 09/26/2022   Colonoscopy  03/13/2023   INFLUENZA VACCINE  08/26/2023   Medicare Annual Wellness (AWV)  06/05/2024   Pneumonia Vaccine 67+ Years old  Completed   Hepatitis C Screening  Completed   HPV  VACCINES  Aged Out   Meningococcal B Vaccine  Aged Out   Zoster Vaccines- Shingrix  Discontinued    Health Maintenance  Health Maintenance Due  Topic Date Due   DTaP/Tdap/Td (2 - Td or Tdap) 05/30/2022   COVID-19 Vaccine (6 - 2024-25 season) 09/26/2022   Colonoscopy  03/13/2023   Health Maintenance Items Addressed: Referral sent to GI for colonoscopy  Additional Screening:  Vision Screening: Recommended annual ophthalmology exams for early detection of glaucoma and other disorders of the eye.  Dental Screening: Recommended annual dental exams for proper oral hygiene  Community Resource Referral / Chronic Care Management: CRR required this visit?  No   CCM required this visit?  No   Plan:    I have personally reviewed and noted the following in the patient's chart:   Medical and social history Use of alcohol, tobacco or illicit drugs  Current medications and supplements including opioid prescriptions. Patient is not currently taking opioid prescriptions. Functional ability and  status Nutritional status Physical activity Advanced directives List of other physicians Hospitalizations, surgeries, and ER visits in previous 12 months Vitals Screenings to include cognitive, depression, and falls Referrals and appointments  In addition, I have reviewed and discussed with patient certain preventive protocols, quality metrics, and best practice recommendations. A written personalized care plan for preventive services as well as general preventive health recommendations were provided to patient.   Patria Bookbinder, CMA   06/06/2023   After Visit Summary: (MyChart) Due to this being a telephonic visit, the after visit summary with patients personalized plan was offered to patient via MyChart   Notes: Nothing significant to report at this time.

## 2023-06-20 ENCOUNTER — Other Ambulatory Visit: Payer: Self-pay | Admitting: Internal Medicine

## 2023-06-22 ENCOUNTER — Encounter: Payer: Self-pay | Admitting: Gastroenterology

## 2023-07-14 DIAGNOSIS — L578 Other skin changes due to chronic exposure to nonionizing radiation: Secondary | ICD-10-CM | POA: Diagnosis not present

## 2023-07-14 DIAGNOSIS — L570 Actinic keratosis: Secondary | ICD-10-CM | POA: Diagnosis not present

## 2023-07-14 DIAGNOSIS — D2371 Other benign neoplasm of skin of right lower limb, including hip: Secondary | ICD-10-CM | POA: Diagnosis not present

## 2023-07-14 DIAGNOSIS — L814 Other melanin hyperpigmentation: Secondary | ICD-10-CM | POA: Diagnosis not present

## 2023-07-14 DIAGNOSIS — D1801 Hemangioma of skin and subcutaneous tissue: Secondary | ICD-10-CM | POA: Diagnosis not present

## 2023-07-14 DIAGNOSIS — I781 Nevus, non-neoplastic: Secondary | ICD-10-CM | POA: Diagnosis not present

## 2023-07-14 DIAGNOSIS — Z85828 Personal history of other malignant neoplasm of skin: Secondary | ICD-10-CM | POA: Diagnosis not present

## 2023-07-14 DIAGNOSIS — Z08 Encounter for follow-up examination after completed treatment for malignant neoplasm: Secondary | ICD-10-CM | POA: Diagnosis not present

## 2023-07-14 DIAGNOSIS — L821 Other seborrheic keratosis: Secondary | ICD-10-CM | POA: Diagnosis not present

## 2023-07-15 ENCOUNTER — Ambulatory Visit (AMBULATORY_SURGERY_CENTER)

## 2023-07-15 VITALS — Ht 70.0 in | Wt 210.0 lb

## 2023-07-15 DIAGNOSIS — Z860100 Personal history of colon polyps, unspecified: Secondary | ICD-10-CM

## 2023-07-15 MED ORDER — NA SULFATE-K SULFATE-MG SULF 17.5-3.13-1.6 GM/177ML PO SOLN: 1.0000 | Freq: Once | ORAL | 0 refills | Status: AC

## 2023-07-15 NOTE — Progress Notes (Signed)

## 2023-08-01 ENCOUNTER — Ambulatory Visit (AMBULATORY_SURGERY_CENTER): Admitting: Gastroenterology

## 2023-08-01 ENCOUNTER — Encounter: Payer: Self-pay | Admitting: Gastroenterology

## 2023-08-01 VITALS — BP 133/76 | HR 63 | Temp 98.1°F | Resp 17 | Ht 70.0 in | Wt 210.0 lb

## 2023-08-01 DIAGNOSIS — K648 Other hemorrhoids: Secondary | ICD-10-CM

## 2023-08-01 DIAGNOSIS — Z860100 Personal history of colon polyps, unspecified: Secondary | ICD-10-CM

## 2023-08-01 DIAGNOSIS — D122 Benign neoplasm of ascending colon: Secondary | ICD-10-CM | POA: Diagnosis not present

## 2023-08-01 DIAGNOSIS — Z860101 Personal history of adenomatous and serrated colon polyps: Secondary | ICD-10-CM

## 2023-08-01 DIAGNOSIS — K5730 Diverticulosis of large intestine without perforation or abscess without bleeding: Secondary | ICD-10-CM

## 2023-08-01 DIAGNOSIS — K641 Second degree hemorrhoids: Secondary | ICD-10-CM | POA: Diagnosis not present

## 2023-08-01 DIAGNOSIS — K635 Polyp of colon: Secondary | ICD-10-CM | POA: Diagnosis not present

## 2023-08-01 DIAGNOSIS — Z1211 Encounter for screening for malignant neoplasm of colon: Secondary | ICD-10-CM

## 2023-08-01 DIAGNOSIS — Z800 Family history of malignant neoplasm of digestive organs: Secondary | ICD-10-CM

## 2023-08-01 DIAGNOSIS — D123 Benign neoplasm of transverse colon: Secondary | ICD-10-CM | POA: Diagnosis not present

## 2023-08-01 DIAGNOSIS — I10 Essential (primary) hypertension: Secondary | ICD-10-CM | POA: Diagnosis not present

## 2023-08-01 MED ORDER — SODIUM CHLORIDE 0.9 % IV SOLN: 500.0000 mL | Freq: Once | INTRAVENOUS | Status: DC

## 2023-08-01 NOTE — Patient Instructions (Signed)
 Thank yo for letting us  care for your healthcare needs today! Please see handouts regarding Polyps, Hemorrhoids, and Diverticulosis. Resume previous diet. Continue present medications. Await pathology results. Return to GI Clinic PRN.  YOU HAD AN ENDOSCOPIC PROCEDURE TODAY AT THE [facility]:   Refer to the procedure report that was given to you for any specific questions about what was found during the examination.  If the procedure report does not answer your questions, please call your gastroenterologist to clarify.  If you requested that your care partner not be given the details of your procedure findings, then the procedure report has been included in a sealed envelope for you to review at your convenience later.  YOU SHOULD EXPECT: Some feelings of bloating in the abdomen. Passage of more gas than usual.  Walking can help get rid of the air that was put into your GI tract during the procedure and reduce the bloating. If you had a lower endoscopy (such as a colonoscopy or flexible sigmoidoscopy) you may notice spotting of blood in your stool or on the toilet paper. If you underwent a bowel prep for your procedure, you may not have a normal bowel movement for a few days.  Please Note:  You might notice some irritation and congestion in your nose or some drainage.  This is from the oxygen used during your procedure.  There is no need for concern and it should clear up in a day or so.  SYMPTOMS TO REPORT IMMEDIATELY:  Following lower endoscopy (colonoscopy or flexible sigmoidoscopy):  Excessive amounts of blood in the stool  Significant tenderness or worsening of abdominal pains  Swelling of the abdomen that is new, acute  Fever of 100F or higher  For urgent or emergent issues, a gastroenterologist can be reached at any hour by calling [phone]. Do not use MyChart messaging for urgent concerns.    DIET:  We do recommend a small meal at first, but then you may proceed to  your regular diet.  Drink plenty of fluids but you should avoid alcoholic beverages for 24 hours.  ACTIVITY:  You should plan to take it easy for the rest of today and you should NOT DRIVE or use heavy machinery until tomorrow (because of the sedation medicines used during the test).    FOLLOW UP: Our staff will call the number listed on your records the next business day following your procedure.  We will call around 7:15- 8:00 am to check on you and address any questions or concerns that you may have regarding the information given to you following your procedure. If we do not reach you, we will leave a message.     If any biopsies were taken you will be contacted by phone or by letter within the next 1-3 weeks.  Please call us  at [phone] if you have not heard about the biopsies in 3 weeks.    SIGNATURES/CONFIDENTIALITY: You and/or your care partner have signed paperwork which will be entered into your electronic medical record.  These signatures attest to the fact that that the information above on your After Visit Summary has been reviewed and is understood.  Full responsibility of the confidentiality of this discharge information lies with you and/or your care-partner.

## 2023-08-01 NOTE — Progress Notes (Signed)
 Vss nad trans to pacu

## 2023-08-01 NOTE — Progress Notes (Signed)
 Called to room to assist during endoscopic procedure.  Patient ID and intended procedure confirmed with present staff. Received instructions for my participation in the procedure from the performing physician.

## 2023-08-01 NOTE — Progress Notes (Signed)
 Pt's states no medical or surgical changes since previsit or office visit.

## 2023-08-01 NOTE — Op Note (Signed)
  [facility] Patient Name: Mike Ray Procedure Date: 08/01/2023 7:14 AM MRN: [id number] Endoscopist: Sandor Flatter , MD, [id number] Age: 73 Referring MD:  Date of Birth: 1951/01/11 Gender: Male Account #: [id number] Procedure:                Colonoscopy Indications:              Surveillance: Personal history of adenomatous                            polyps on last colonoscopy 3 years ago                           Last colonoscopy was 02/2020 and notable for 4 small                            3-5 mm sigmoid adenomas, sigmoid diverticulosis,                            normal TI, with recommendation to repeat in 3 years.                           Family history notable for brother with colon                            cancer diagnosed in his 86s. Medicines:                Monitored Anesthesia Care Procedure:                Pre-Anesthesia Assessment:                           - Prior to the procedure, a History and Physical                            was performed, and patient medications and                            allergies were reviewed. The patient's tolerance of                            previous anesthesia was also reviewed. The risks                            and benefits of the procedure and the sedation                            options and risks were discussed with the patient.                            All questions were answered, and informed consent                            was obtained. Prior Anticoagulants: The patient has  taken no anticoagulant or antiplatelet agents. ASA                            Grade Assessment: II - A patient with mild systemic                            disease. After reviewing the risks and benefits,                            the patient was deemed in satisfactory condition to                            undergo the procedure.                           After obtaining informed consent, the  colonoscope                            was passed under direct vision. Throughout the                            procedure, the patient's blood pressure, pulse, and                            oxygen saturations were monitored continuously. The                            CF [id number] #[id number] was introduced through the anus                            and advanced to the the cecum, identified by                            appendiceal orifice and ileocecal valve. The                            colonoscopy was performed without difficulty. The                            patient tolerated the procedure well. The quality                            of the bowel preparation was good. The ileocecal                            valve, appendiceal orifice, and rectum were                            photographed. Scope In: 8:04:40 AM Scope Out: 8:28:05 AM Scope Withdrawal Time: 0 hours 15 minutes 19 seconds  Total Procedure Duration: 0 hours 23 minutes 25 seconds  Findings:                 The perianal and digital rectal examinations were  normal.                           Two sessile polyps were found in the transverse                            colon and ascending colon. The polyps were 2 to 3                            mm in size. These polyps were removed with a cold                            snare. Resection and retrieval were complete.                            Estimated blood loss was minimal.                           Multiple large-mouthed and small-mouthed                            diverticula were found in the sigmoid colon.                           Non-bleeding internal hemorrhoids were found during                            retroflexion. The hemorrhoids were small. Complications:            No immediate complications. Estimated Blood Loss:     Estimated blood loss was minimal. Impression:               - Two 2 to 3 mm polyps in the transverse colon and                             in the ascending colon, removed with a cold snare.                            Resected and retrieved.                           - Diverticulosis in the sigmoid colon.                           - Non-bleeding internal hemorrhoids. Recommendation:           - Patient has a contact number available for                            emergencies. The signs and symptoms of potential                            delayed complications were discussed with the                            patient. Return  to normal activities tomorrow.                            Written discharge instructions were provided to the                            patient.                           - Resume previous diet.                           - Continue present medications.                           - Await pathology results.                           - Repeat colonoscopy for surveillance based on                            pathology results.                           - Return to GI clinic PRN. Sandor Flatter, MD 08/01/2023 8:33:00 AM

## 2023-08-01 NOTE — Progress Notes (Signed)
 GASTROENTEROLOGY PROCEDURE H&P NOTE   Primary Care Physician: Geofm Glade PARAS, MD    Reason for Procedure:  Colon polyp surveillance, family history of colon cancer  Plan:    Colonoscopy  Patient is appropriate for endoscopic procedure(s) in the ambulatory ([facility]) setting.  The nature of the procedure, as well as the risks, benefits, and alternatives were carefully and thoroughly reviewed with the patient. Ample time for discussion and questions allowed. The patient understood, was satisfied, and agreed to proceed.     HPI: Mike Ray is a 73 y.o. male who presents for colonoscopy for ongoing colon polyp surveillance and increased risk screening due to family history of colon cancer.  No active GI symptoms.    Last colonoscopy was 02/2020 and notable for 4 small 3-5 mm sigmoid adenomas, sigmoid diverticulosis, normal TI, with recommendation to repeat in 3 years.  Family history notable for brother with colon cancer diagnosed in his 33s.  No recent abdominal imaging for review.  Past Medical History:  Diagnosis Date   Acute MI, inferolateral wall, initial episode of care (HCC) 07/30/2015   Coronary artery disease    Elevated PSA    Dr Donelle   Gilbert's syndrome    History of heat stroke 2009   History of nephrolithiasis    Hyperlipidemia    NMR 06/2009: LDL 88(1345/912), HDL 40, TG 127. Framingham Study LDL goal =< 130    Hypertension    Osteoarthritis of left hip    hip replacement right and left    Past Surgical History:  Procedure Laterality Date   CARDIAC CATHETERIZATION     CARDIAC CATHETERIZATION N/A 07/30/2015   Procedure: Left Heart Cath and Coronary Angiography;  Surgeon: Ozell Fell, MD;  Location: [facility] INVASIVE CV LAB;  Service: Cardiovascular;  Laterality: N/A;   CARDIAC CATHETERIZATION N/A 07/30/2015   Procedure: Coronary Stent Intervention;  Surgeon: Ozell Fell, MD;  Location: [facility] INVASIVE CV LAB;  Service: Cardiovascular;  Laterality: N/A;  Distal  RCA- Promus 3.50x16   COLONOSCOPY  2011   negative   INGUINAL HERNIA REPAIR Right 10/19/2017   Procedure: RIGHT  INGUINAL HERNIA REPAIR ERAS PATHWAY;  Surgeon: Vanderbilt Ned, MD;  Location: [facility];  Service: General;  Laterality: Right;  Tap block   INSERTION OF MESH Right 10/19/2017   Procedure: INSERTION OF MESH;  Surgeon: Vanderbilt Ned, MD;  Location: [facility];  Service: General;  Laterality: Right;   LITHOTRIPSY  2009   Dr.Duckett, [place]    TOTAL HIP ARTHROPLASTY  2008   left   TOTAL HIP ARTHROPLASTY  2004   right    Prior to Admission medications   Medication Sig Start Date End Date Taking? Authorizing Provider  amLODipine  (NORVASC ) 10 MG tablet Take 1 tablet by mouth once daily 06/21/23  Yes Burns, Glade PARAS, MD  Ascorbic Acid (SUPER C COMPLEX PO) Take by mouth 2 (two) times daily.   Yes [provider]  aspirin  81 MG tablet Take 81 mg by mouth daily.   Yes [provider]  atorvastatin  (LIPITOR ) 80 MG tablet TAKE 1 TABLET BY MOUTH ONCE DAILY IN THE EVENING 03/28/23  Yes Burns, Glade PARAS, MD  calcium  carbonate (TUMS - DOSED IN MG ELEMENTAL CALCIUM ) 500 MG chewable tablet Chew 1 tablet by mouth daily as needed.   Yes [provider]  Coenzyme Q10 (COQ10) 100 MG CAPS Take 1 capsule by mouth daily.   Yes [provider]  losartan  (COZAAR ) 100 MG  tablet TAKE 1 TABLET BY MOUTH EVERY DAY 02/17/23  Yes Cooper, Michael, MD  metoprolol  tartrate (LOPRESSOR ) 25 MG tablet Take 1/2 (one-half) tablet by mouth twice daily 06/01/23  Yes Burns, Glade PARAS, MD  Multiple Vitamin (MULTIVITAMIN) tablet Take 1 tablet by mouth every other day.  Patient taking differently: Take 1 tablet by mouth daily.   Yes [provider]  famotidine (PEPCID) 20 MG tablet Take 20 mg by mouth daily as needed for heartburn or indigestion.    [provider]  meloxicam (MOBIC) 15 MG tablet Take 15 mg by mouth daily. 06/22/23   [provider]  nitroGLYCERIN  (NITROSTAT ) 0.4 MG SL tablet Dissolve 1 tablet under the tongue every 5 minutes as needed for chest pain. Max of 3 doses, then 911 01/31/23   Wonda Sharper, MD  sildenafil  (REVATIO ) 20 MG tablet TAKE 2 TO 5 TABLETS BY MOUTH AS NEEDED FOR ERECTILE DYSFUNCTION. DO NOT USE WITH NITROGLYCERIN  03/17/23   Wonda Sharper, MD    Current Outpatient Medications  Medication Sig Dispense Refill   amLODipine  (NORVASC ) 10 MG tablet Take 1 tablet by mouth once daily 90 tablet 0   Ascorbic Acid (SUPER C COMPLEX PO) Take by mouth 2 (two) times daily.     aspirin  81 MG tablet Take 81 mg by mouth daily.     atorvastatin  (LIPITOR ) 80 MG tablet TAKE 1 TABLET BY MOUTH ONCE DAILY IN THE EVENING 90 tablet 1   calcium  carbonate (TUMS - DOSED IN MG ELEMENTAL CALCIUM ) 500 MG chewable tablet Chew 1 tablet by mouth daily as needed.     Coenzyme Q10 (COQ10) 100 MG CAPS Take 1 capsule by mouth daily.     losartan  (COZAAR ) 100 MG tablet TAKE 1 TABLET BY MOUTH EVERY DAY 90 tablet 3   metoprolol  tartrate (LOPRESSOR ) 25 MG tablet Take 1/2 (one-half) tablet by mouth twice daily 90 tablet 0   Multiple Vitamin (MULTIVITAMIN) tablet Take 1 tablet by mouth every other day.  (Patient taking differently: Take 1 tablet by mouth daily.)     famotidine (PEPCID) 20 MG tablet Take 20 mg by mouth daily as needed for heartburn or indigestion.     meloxicam (MOBIC) 15 MG tablet Take 15 mg by mouth daily.     nitroGLYCERIN  (NITROSTAT ) 0.4 MG SL tablet Dissolve 1 tablet under the tongue every 5 minutes as needed for chest pain. Max of 3 doses, then 911 25 tablet 3   sildenafil  (REVATIO ) 20 MG tablet TAKE 2 TO 5 TABLETS BY MOUTH AS NEEDED FOR ERECTILE DYSFUNCTION. DO NOT USE WITH NITROGLYCERIN  90 tablet 6   Current Facility-Administered Medications  Medication Dose Route Frequency Provider Last Rate Last Admin   0.9 %  sodium chloride  infusion  500 mL Intravenous Once Davin Archuletta V, DO        Allergies as of  08/01/2023   (No Known Allergies)    Family History  Problem Relation Age of Onset   Diabetes Mother    Hypertension Mother    Heart attack Father 76       smoker   Prostate cancer Brother 70       CAD; stents @ 82   Hypertension Brother    Heart attack Brother 29   Coronary artery disease Brother        stents late 34s   Stroke Neg Hx    Colon cancer Neg Hx    Colon polyps Neg Hx    Esophageal cancer Neg Hx  Rectal cancer Neg Hx    Stomach cancer Neg Hx     Social History   Socioeconomic History   Marital status: Married    Spouse name: Not on file   Number of children: 3   Years of education: Not on file   Highest education level: Not on file  Occupational History   Occupation: Surveyor, minerals  Tobacco Use   Smoking status: Former    Current packs/day: 0.00    Average packs/day: 1 pack/day for 32.0 years (32.0 ttl pk-yrs)    Types: Cigarettes    Start date: 61    Quit date: 01/26/2000    Years since quitting: 23.5    Passive exposure: Past   Smokeless tobacco: Never   Tobacco comments:    Pt stopped a few times.  Vaping Use   Vaping status: Never Used  Substance and Sexual Activity   Alcohol use: Yes    Alcohol/week: 14.0 standard drinks of alcohol    Types: 14 Cans of beer per week    Comment: 14 beers/week   Drug use: No   Sexual activity: Yes    Birth control/protection: Other-see comments  Other Topics Concern   Not on file  Social History Narrative   Married   Social Drivers of Health   Financial Resource Strain: Low Risk  (06/06/2023)   Overall Financial Resource Strain (CARDIA)    Difficulty of Paying Living Expenses: Not hard at all  Food Insecurity: No Food Insecurity (06/06/2023)   Hunger Vital Sign    Worried About Running Out of Food in the Last Year: Never true    Ran Out of Food in the Last Year: Never true  Transportation Needs: No Transportation Needs (06/06/2023)   PRAPARE - Administrator, Civil Service (Medical): No     Lack of Transportation (Non-Medical): No  Physical Activity: Insufficiently Active (06/06/2023)   Exercise Vital Sign    Days of Exercise per Week: 3 days    Minutes of Exercise per Session: 40 min  Stress: No Stress Concern Present (06/06/2023)   [employer] of Occupational Health - Occupational Stress Questionnaire    Feeling of Stress : Not at all  Social Connections: Socially Integrated (06/06/2023)   Social Connection and Isolation Panel    Frequency of Communication with Friends and Family: More than three times a week    Frequency of Social Gatherings with Friends and Family: More than three times a week    Attends Religious Services: More than 4 times per year    Active Member of [employer] or Organizations: Yes    Attends Engineer, structural: More than 4 times per year    Marital Status: Married  Catering manager Violence: Not At Risk (06/06/2023)   Humiliation, Afraid, Rape, and Kick questionnaire    Fear of Current or Ex-Partner: No    Emotionally Abused: No    Physically Abused: No    Sexually Abused: No    Physical Exam: Vital signs in last 24 hours: @BP  120/75   Pulse 75   Temp 98.1 F (36.7 C)   Ht 5' 10 (1.778 m)   Wt 210 lb (95.3 kg)   BMI 30.13 kg/m  GEN: NAD EYE: Sclerae anicteric ENT: MMM CV: Non-tachycardic Pulm: CTA b/l GI: Soft, NT/ND NEURO:  Alert & Oriented x 3   Sandor Flatter, DO [facility]   08/01/2023 7:49 AM

## 2023-08-02 ENCOUNTER — Telehealth: Payer: Self-pay

## 2023-08-02 NOTE — Telephone Encounter (Signed)
 Called patient back and he states that he has eaten and drank several glasses of fluids including gatorade since calling and now feels better. I advised him to call us  back if       if gets worse and stops improving. He agreed and had no further questions or concerns.

## 2023-08-02 NOTE — Telephone Encounter (Signed)
 Left message on answering machine.

## 2023-08-02 NOTE — Telephone Encounter (Signed)
 Patient called and stated that he did not have any symptoms within the 24 hours since his colonoscopy procedure. But since then he has had the shakes, feeling cold and feeling dizzy. Patient stated that he is drinking a lots of fluids to stay hydrated but does not know what it could be. Patient would like to speak to the nurse. Please advise.

## 2023-08-03 ENCOUNTER — Ambulatory Visit: Payer: Self-pay | Admitting: Gastroenterology

## 2023-08-03 LAB — SURGICAL PATHOLOGY

## 2023-08-14 DIAGNOSIS — H9192 Unspecified hearing loss, left ear: Secondary | ICD-10-CM | POA: Diagnosis not present

## 2023-08-15 ENCOUNTER — Ambulatory Visit (INDEPENDENT_AMBULATORY_CARE_PROVIDER_SITE_OTHER): Admitting: Internal Medicine

## 2023-08-15 ENCOUNTER — Encounter: Payer: Self-pay | Admitting: Internal Medicine

## 2023-08-15 VITALS — BP 118/80 | HR 81 | Temp 98.0°F | Ht 70.0 in | Wt 218.6 lb

## 2023-08-15 DIAGNOSIS — I10 Essential (primary) hypertension: Secondary | ICD-10-CM

## 2023-08-15 DIAGNOSIS — H60332 Swimmer's ear, left ear: Secondary | ICD-10-CM | POA: Insufficient documentation

## 2023-08-15 DIAGNOSIS — R7303 Prediabetes: Secondary | ICD-10-CM

## 2023-08-15 DIAGNOSIS — H9192 Unspecified hearing loss, left ear: Secondary | ICD-10-CM | POA: Diagnosis not present

## 2023-08-15 MED ORDER — CIPROFLOXACIN HCL 500 MG PO TABS: 500.0000 mg | ORAL_TABLET | Freq: Two times a day (BID) | ORAL | 0 refills | Status: AC

## 2023-08-15 MED ORDER — NEOMYCIN-POLYMYXIN-HC 3.5-10000-1 OT SOLN: 3.0000 [drp] | Freq: Four times a day (QID) | OTIC | 0 refills | Status: AC

## 2023-08-15 NOTE — Assessment & Plan Note (Signed)
 BP Readings from Last 3 Encounters:  08/15/23 118/80  08/01/23 133/76  04/05/23 112/70   Stable, pt to continue medical treatment norvasc  10 every day, losartan  100 qd

## 2023-08-15 NOTE — Progress Notes (Signed)
 Patient ID: Mike Ray, male   DOB: 04-07-1950, 73 y.o.   MRN: [id number]        Chief Complaint: follow up left ear pain htn, preDM       HPI:  Mike Ray is a 73 y.o. male here with above x 2 days dizziness, HA and reduced hearing left ear after seen at [facility] with left ear irrigation yesterday.  No fever, chills.  Pt denies chest pain, increased sob or doe, wheezing, orthopnea, PND, increased LE swelling, palpitations, dizziness or syncope. Pt denies polydipsia, polyuria, or new focal neuro s/s.  Swims on a regular basis, last time was 3 days ago       Wt Readings from Last 3 Encounters:  08/15/23 218 lb 9.6 oz (99.2 kg)  08/01/23 210 lb (95.3 kg)  07/15/23 210 lb (95.3 kg)   BP Readings from Last 3 Encounters:  08/15/23 118/80  08/01/23 133/76  04/05/23 112/70         Past Medical History:  Diagnosis Date   Acute MI, inferolateral wall, initial episode of care (HCC) 07/30/2015   Coronary artery disease    Elevated PSA    Dr Donelle   Gilbert's syndrome    History of heat stroke 2009   History of nephrolithiasis    Hyperlipidemia    NMR 06/2009: LDL 88(1345/912), HDL 40, TG 127. Framingham Study LDL goal =< 130    Hypertension    Osteoarthritis of left hip    hip replacement right and left   Past Surgical History:  Procedure Laterality Date   CARDIAC CATHETERIZATION     CARDIAC CATHETERIZATION N/A 07/30/2015   Procedure: Left Heart Cath and Coronary Angiography;  Surgeon: Ozell Fell, MD;  Location: [facility] INVASIVE CV LAB;  Service: Cardiovascular;  Laterality: N/A;   CARDIAC CATHETERIZATION N/A 07/30/2015   Procedure: Coronary Stent Intervention;  Surgeon: Ozell Fell, MD;  Location: [facility] INVASIVE CV LAB;  Service: Cardiovascular;  Laterality: N/A;  Distal RCA- Promus 3.50x16   COLONOSCOPY  2011   negative   INGUINAL HERNIA REPAIR Right 10/19/2017   Procedure: RIGHT  INGUINAL HERNIA REPAIR ERAS PATHWAY;  Surgeon: Vanderbilt Ned, MD;  Location: [facility];  Service: General;  Laterality: Right;  Tap block   INSERTION OF MESH Right 10/19/2017   Procedure: INSERTION OF MESH;  Surgeon: Vanderbilt Ned, MD;  Location: [facility];  Service: General;  Laterality: Right;   LITHOTRIPSY  2009   Dr.Duckett, [place]    TOTAL HIP ARTHROPLASTY  2008   left   TOTAL HIP ARTHROPLASTY  2004   right    reports that he quit smoking about 23 years ago. His smoking use included cigarettes. He started smoking about 55 years ago. He has a 32 pack-year smoking history. He has been exposed to tobacco smoke. He has never used smokeless tobacco. He reports current alcohol use of about 14.0 standard drinks of alcohol per week. He reports that he does not use drugs. family history includes Coronary artery disease in his brother; Diabetes in his mother; Heart attack (age of onset: 41) in his father; Heart attack (age of onset: 61) in his brother; Hypertension in his brother and mother; Prostate cancer (age of onset: 5) in his brother. No Known Allergies Current Outpatient Medications on File Prior to Visit  Medication Sig Dispense Refill   amLODipine  (NORVASC ) 10 MG tablet Take 1 tablet by mouth once daily 90 tablet 0   Ascorbic Acid (SUPER C  COMPLEX PO) Take by mouth 2 (two) times daily.     aspirin  81 MG tablet Take 81 mg by mouth daily.     atorvastatin  (LIPITOR ) 80 MG tablet TAKE 1 TABLET BY MOUTH ONCE DAILY IN THE EVENING 90 tablet 1   calcium  carbonate (TUMS - DOSED IN MG ELEMENTAL CALCIUM ) 500 MG chewable tablet Chew 1 tablet by mouth daily as needed.     Coenzyme Q10 (COQ10) 100 MG CAPS Take 1 capsule by mouth daily.     famotidine (PEPCID) 20 MG tablet Take 20 mg by mouth daily as needed for heartburn or indigestion.     losartan  (COZAAR ) 100 MG tablet TAKE 1 TABLET BY MOUTH EVERY DAY 90 tablet 3   meloxicam (MOBIC) 15 MG tablet Take 15 mg by mouth daily.     metoprolol  tartrate (LOPRESSOR ) 25 MG tablet Take 1/2 (one-half) tablet by mouth  twice daily 90 tablet 0   Multiple Vitamin (MULTIVITAMIN) tablet Take 1 tablet by mouth every other day.  (Patient taking differently: Take 1 tablet by mouth daily.)     nitroGLYCERIN  (NITROSTAT ) 0.4 MG SL tablet Dissolve 1 tablet under the tongue every 5 minutes as needed for chest pain. Max of 3 doses, then 911 25 tablet 3   sildenafil  (REVATIO ) 20 MG tablet TAKE 2 TO 5 TABLETS BY MOUTH AS NEEDED FOR ERECTILE DYSFUNCTION. DO NOT USE WITH NITROGLYCERIN  90 tablet 6   No current facility-administered medications on file prior to visit.        ROS:  All others reviewed and negative.  Objective        PE:  BP 118/80   Pulse 81   Temp 98 F (36.7 C)   Ht 5' 10 (1.778 m)   Wt 218 lb 9.6 oz (99.2 kg)   SpO2 97%   BMI 31.37 kg/m                 Constitutional: Pt appears in NAD               HENT: Head: NCAT.                Right Ear: External ear normal.                 Left Ear: External ear normal. Minimal cerumen noted, anal with 1+ red, tender anterior aspect               Eyes: . Pupils are equal, round, and reactive to light. Conjunctivae and EOM are normal               Nose: without d/c or deformity               Neck: Neck supple. Gross normal ROM               Cardiovascular: Normal rate and regular rhythm.                 Pulmonary/Chest: Effort normal and breath sounds without rales or wheezing.                Abd:  Soft, NT, ND, + BS, no organomegaly               Neurological: Pt is alert. At baseline orientation, motor grossly intact               Skin: Skin is warm. No rashes, no other new lesions, LE edema - none  Psychiatric: Pt behavior is normal without agitation   Micro: none  Cardiac tracings I have personally interpreted today:  none  Pertinent Radiological findings (summarize): none   Lab Results  Component Value Date   WBC 5.7 10/06/2022   HGB 15.5 10/06/2022   HCT 46.0 10/06/2022   PLT 255.0 10/06/2022   GLUCOSE 132 (H) 04/05/2023    CHOL 100 04/05/2023   TRIG 173.0 (H) 04/05/2023   HDL 33.80 (L) 04/05/2023   LDLDIRECT 49.0 03/02/2022   LDLCALC 32 04/05/2023   ALT 32 04/05/2023   AST 28 04/05/2023   NA 138 04/05/2023   K 4.3 04/05/2023   CL 102 04/05/2023   CREATININE 0.91 04/05/2023   BUN 15 04/05/2023   CO2 28 04/05/2023   TSH 1.86 02/09/2021   PSA 2.37 08/06/2019   INR 1.20 07/30/2015   HGBA1C 6.3 04/05/2023   Assessment/Plan:  Mike Ray is a 73 y.o. White or Caucasian [1] male with  has a past medical history of Acute MI, inferolateral wall, initial episode of care (HCC) (07/30/2015), Coronary artery disease, Elevated PSA, Gilbert's syndrome, History of heat stroke (2009), History of nephrolithiasis, Hyperlipidemia, Hypertension, and Osteoarthritis of left hip.  Essential hypertension BP Readings from Last 3 Encounters:  08/15/23 118/80  08/01/23 133/76  04/05/23 112/70   Stable, pt to continue medical treatment norvasc  10 every day, losartan  100 qd    Prediabetes Lab Results  Component Value Date   HGBA1C 6.3 04/05/2023   Stable, pt to continue current medical treatment  - diet,wt control   Swimmer's ear of left side Mild to mod, for antibx course cipro  500 mg bid,,  to f/u any worsening symptoms or concerns  Hearing loss, left Etiology unclear, will need ENT eval if not improved in 3-5 days, also for mucinex bid prn  Followup: Return if symptoms worsen or fail to improve.  Lynwood Rush, MD 08/15/2023 6:50 PM  [facility] Internal Medicine

## 2023-08-15 NOTE — Assessment & Plan Note (Signed)
 Etiology unclear, will need ENT eval if not improved in 3-5 days, also for mucinex bid prn

## 2023-08-15 NOTE — Assessment & Plan Note (Signed)
 Mild to mod, for antibx course cipro  500 mg bid,,  to f/u any worsening symptoms or concerns

## 2023-08-15 NOTE — Patient Instructions (Signed)
 Please take all new medication as prescribed - the ear drop antibiotic, as well as the pill antibiotic  You can also take Mucinex (or it's generic off brand) for congestion, and tylenol  as needed for pain.  Please continue all other medications as before, and refills have been done if requested.  Please have the pharmacy call with any other refills you may need.  Please keep your appointments with your specialists as you may have planned  Please call in 2-3 days if hearing not improved, for referral to ENT

## 2023-08-15 NOTE — Assessment & Plan Note (Signed)
 Lab Results  Component Value Date   HGBA1C 6.3 04/05/2023   Stable, pt to continue current medical treatment  - diet,wt control

## 2023-08-19 ENCOUNTER — Telehealth: Payer: Self-pay | Admitting: Internal Medicine

## 2023-08-19 DIAGNOSIS — H9202 Otalgia, left ear: Secondary | ICD-10-CM

## 2023-08-19 NOTE — Telephone Encounter (Signed)
Ok this has been done

## 2023-08-19 NOTE — Telephone Encounter (Signed)
 Copied from CRM [id number]. Topic: Clinical - Medical Advice >> Aug 19, 2023 11:08 AM Chasity T wrote: Reason for CRM: Patient is calling in because his ear infection has not improved in the 2-3 days and would like to move forward to the next steps with the ENT referral. Please contact patient regarding concerns.

## 2023-08-24 ENCOUNTER — Ambulatory Visit (INDEPENDENT_AMBULATORY_CARE_PROVIDER_SITE_OTHER): Admitting: Physician Assistant

## 2023-08-24 ENCOUNTER — Encounter (INDEPENDENT_AMBULATORY_CARE_PROVIDER_SITE_OTHER): Payer: Self-pay | Admitting: Physician Assistant

## 2023-08-24 VITALS — BP 118/81 | HR 82

## 2023-08-24 DIAGNOSIS — H9192 Unspecified hearing loss, left ear: Secondary | ICD-10-CM

## 2023-08-24 MED ORDER — FLUTICASONE PROPIONATE 50 MCG/ACT NA SUSP: 2.0000 | Freq: Every day | NASAL | 6 refills | Status: AC

## 2023-08-24 MED ORDER — LORATADINE 10 MG PO TABS: 10.0000 mg | ORAL_TABLET | Freq: Every day | ORAL | 11 refills | Status: AC

## 2023-08-24 NOTE — Progress Notes (Signed)
 Dear Dr. Norleen, Here is my assessment for our mutual patient, Mike Ray. Thank you for allowing me the opportunity to care for your patient. Please do not hesitate to contact me should you have any other questions. Sincerely, [name] Cohen PA-C  Otolaryngology Clinic Note Referring provider: Dr. Norleen HPI:  Mike Ray is a 73 y.o. male kindly referred by Dr. Norleen   The patient is a 73 year old gentleman presenting today with left-sided ear related complaints.  The patient notes approximate 2 weeks ago he felt fullness in his left ear as if he had water in it.  He notes he is an avid swimmer and uses eardrops to remove the water but feels like this has not helped.  He also noted abnormal perception of hearing on the left as if he was in a tin can.  He notes occasional tonal tinnitus and an occasional dripping sound in the left ear.  Denies any associated pain, no dizziness.  He notes at baseline his hearing is always been worse on the left when compared to right but it has been significantly worse over the last several weeks.  He notes he followed up with his primary care provider, they diagnosed him with otitis externa and started on ciprofloxacin  and otic drops.  He notes these have not significantly improved his symptoms.  He does have some associated dizziness which he describes as unsteadiness when he goes from laying down to standing up.  He denies any associated trauma to his ear, no history of recurrent ear infections as a child or an adult, no ear surgery.    Independent Review of Additional Tests or Records:  PCP office visit note 08/15/2023    PMH/Meds/All/SocHx/FamHx/ROS:   Past Medical History:  Diagnosis Date   Acute MI, inferolateral wall, initial episode of care (HCC) 07/30/2015   Coronary artery disease    Elevated PSA    Dr Donelle   Gilbert's syndrome    History of heat stroke 2009   History of nephrolithiasis    Hyperlipidemia    NMR 06/2009: LDL 88(1345/912), HDL  40, TG 127. Framingham Study LDL goal =< 130    Hypertension    Osteoarthritis of left hip    hip replacement right and left     Past Surgical History:  Procedure Laterality Date   CARDIAC CATHETERIZATION     CARDIAC CATHETERIZATION N/A 07/30/2015   Procedure: Left Heart Cath and Coronary Angiography;  Surgeon: Ozell Fell, MD;  Location: [facility] INVASIVE CV LAB;  Service: Cardiovascular;  Laterality: N/A;   CARDIAC CATHETERIZATION N/A 07/30/2015   Procedure: Coronary Stent Intervention;  Surgeon: Ozell Fell, MD;  Location: [facility] INVASIVE CV LAB;  Service: Cardiovascular;  Laterality: N/A;  Distal RCA- Promus 3.50x16   COLONOSCOPY  2011   negative   INGUINAL HERNIA REPAIR Right 10/19/2017   Procedure: RIGHT  INGUINAL HERNIA REPAIR ERAS PATHWAY;  Surgeon: Vanderbilt Ned, MD;  Location: [facility];  Service: General;  Laterality: Right;  Tap block   INSERTION OF MESH Right 10/19/2017   Procedure: INSERTION OF MESH;  Surgeon: Vanderbilt Ned, MD;  Location: [facility];  Service: General;  Laterality: Right;   LITHOTRIPSY  2009   Dr.Duckett, [place]    TOTAL HIP ARTHROPLASTY  2008   left   TOTAL HIP ARTHROPLASTY  2004   right    Family History  Problem Relation Age of Onset   Diabetes Mother    Hypertension Mother    Heart attack Father  38       smoker   Prostate cancer Brother 97       CAD; stents @ 56   Hypertension Brother    Heart attack Brother 40   Coronary artery disease Brother        stents late 50s   Stroke Neg Hx    Colon cancer Neg Hx    Colon polyps Neg Hx    Esophageal cancer Neg Hx    Rectal cancer Neg Hx    Stomach cancer Neg Hx      Social Connections: Socially Integrated (06/06/2023)   Social Connection and Isolation Panel    Frequency of Communication with Friends and Family: More than three times a week    Frequency of Social Gatherings with Friends and Family: More than three times a week    Attends Religious Services: More  than 4 times per year    Active Member of [employer] or Organizations: Yes    Attends Engineer, structural: More than 4 times per year    Marital Status: Married      Current Outpatient Medications:    amLODipine  (NORVASC ) 10 MG tablet, Take 1 tablet by mouth once daily, Disp: 90 tablet, Rfl: 0   Ascorbic Acid (SUPER C COMPLEX PO), Take by mouth 2 (two) times daily., Disp: , Rfl:    aspirin  81 MG tablet, Take 81 mg by mouth daily., Disp: , Rfl:    atorvastatin  (LIPITOR ) 80 MG tablet, TAKE 1 TABLET BY MOUTH ONCE DAILY IN THE EVENING, Disp: 90 tablet, Rfl: 1   calcium  carbonate (TUMS - DOSED IN MG ELEMENTAL CALCIUM ) 500 MG chewable tablet, Chew 1 tablet by mouth daily as needed., Disp: , Rfl:    ciprofloxacin  (CIPRO ) 500 MG tablet, Take 1 tablet (500 mg total) by mouth 2 (two) times daily for 10 days., Disp: 20 tablet, Rfl: 0   Coenzyme Q10 (COQ10) 100 MG CAPS, Take 1 capsule by mouth daily., Disp: , Rfl:    famotidine (PEPCID) 20 MG tablet, Take 20 mg by mouth daily as needed for heartburn or indigestion., Disp: , Rfl:    losartan  (COZAAR ) 100 MG tablet, TAKE 1 TABLET BY MOUTH EVERY DAY, Disp: 90 tablet, Rfl: 3   meloxicam (MOBIC) 15 MG tablet, Take 15 mg by mouth daily., Disp: , Rfl:    metoprolol  tartrate (LOPRESSOR ) 25 MG tablet, Take 1/2 (one-half) tablet by mouth twice daily, Disp: 90 tablet, Rfl: 0   Multiple Vitamin (MULTIVITAMIN) tablet, Take 1 tablet by mouth every other day.  (Patient taking differently: Take 1 tablet by mouth daily.), Disp: , Rfl:    neomycin -polymyxin-hydrocortisone  (CORTISPORIN) OTIC solution, Place 3 drops into the left ear 4 (four) times daily., Disp: 10 mL, Rfl: 0   nitroGLYCERIN  (NITROSTAT ) 0.4 MG SL tablet, Dissolve 1 tablet under the tongue every 5 minutes as needed for chest pain. Max of 3 doses, then 911, Disp: 25 tablet, Rfl: 3   sildenafil  (REVATIO ) 20 MG tablet, TAKE 2 TO 5 TABLETS BY MOUTH AS NEEDED FOR ERECTILE DYSFUNCTION. DO NOT USE WITH  NITROGLYCERIN , Disp: 90 tablet, Rfl: 6   Physical Exam:   BP 118/81   Pulse 82   SpO2 94%   Pertinent Findings  CN II-XII intact Right external auditory canal clear, intact well-pneumatized middle ear space, left external auditory canal clear, pearly flesh-colored nodule in the right anterior external auditory canal , TM intact with questionable mucoid effusion Weber 512: Localizes right Rinne 512: AC > BC b/l  Anterior rhinoscopy: Septum midline; bilateral inferior turbinates with minimal hypertrophy No lesions of oral cavity/oropharynx; dentition in normal limits No obviously palpable neck masses/lymphadenopathy/thyromegaly No respiratory distress or stridor   Seprately Identifiable Procedures:  None  Impression & Plans:  Mike Ray is a 73 y.o. male with the following   Decreased hearing left ear-  73 year old gentleman presents today with decreased hearing out of the left ear.  Baseline hearing is always been worse in that ear but it has been worse over the last 2 weeks.  He describes a fluid sensation in his ear as well as somewhat of an echoing sound in the ear.  There appears to be a small amount of mucoid effusion in the left middle ear.  I would recommend initiating Flonase , Claritin , daily saline nasal irrigation.  He will need a audiological evaluation, the soonest available will be at [facility] outpatient, he will complete this as soon as possible, once these results are available I will discuss the findings with him and provide a plan moving forward.   - f/u phone call discussion with audiological evaluation   Thank you for allowing me the opportunity to care for your patient. Please do not hesitate to contact me should you have any other questions.  Sincerely, [name] Cohen PA-C [facility] Phone: [phone] Fax: [phone]  08/24/2023, 1:57 PM

## 2023-08-28 ENCOUNTER — Other Ambulatory Visit: Payer: Self-pay | Admitting: Internal Medicine

## 2023-09-05 ENCOUNTER — Ambulatory Visit: Attending: Physician Assistant | Admitting: Audiologist

## 2023-09-05 ENCOUNTER — Ambulatory Visit (INDEPENDENT_AMBULATORY_CARE_PROVIDER_SITE_OTHER): Admitting: Physician Assistant

## 2023-09-05 VITALS — BP 122/77 | HR 72

## 2023-09-05 DIAGNOSIS — H903 Sensorineural hearing loss, bilateral: Secondary | ICD-10-CM | POA: Insufficient documentation

## 2023-09-05 DIAGNOSIS — H9192 Unspecified hearing loss, left ear: Secondary | ICD-10-CM | POA: Insufficient documentation

## 2023-09-05 DIAGNOSIS — H9120 Sudden idiopathic hearing loss, unspecified ear: Secondary | ICD-10-CM | POA: Diagnosis not present

## 2023-09-05 DIAGNOSIS — H9042 Sensorineural hearing loss, unilateral, left ear, with unrestricted hearing on the contralateral side: Secondary | ICD-10-CM | POA: Diagnosis not present

## 2023-09-05 DIAGNOSIS — H9122 Sudden idiopathic hearing loss, left ear: Secondary | ICD-10-CM

## 2023-09-05 MED ORDER — PREDNISONE 10 MG PO TABS: ORAL_TABLET | ORAL | 0 refills | Status: AC

## 2023-09-05 NOTE — Procedures (Signed)
 Outpatient Audiology and [facility] [address] [place], [place]  [address] [phone]  AUDIOLOGICAL  EVALUATION  NAME: Mike Ray     DOB:   10/27/1950      MRN: [id number]                                                                                     DATE: 09/05/2023     REFERENT: Geofm Glade PARAS, MD STATUS: Outpatient DIAGNOSIS: Profound Sensorineural Hearing Loss Left Ear, Presbycusis Right Ear    History: Maijor was seen for an audiological evaluation due to sudden onset of sensorineural hearing loss in the left ear three weeks ago. 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 He hears a intermittent loud sound like rushing water. He has random moments of imbalance. He went to urgent care and was prescribed Flonase  and allergy medication. Nothing has helped. He thinks he at one point was prescribed a steroid but this was not found in medical review. He saw Juliane Cohen, [facility] PA on 08/24/23 who ordered the hearing test.  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  Medical history shows no other risk for hearing loss.    Evaluation:  Otoscopy showed a clear view of the tympanic membranes, bilaterally Tympanometry results were consistent with normal middle ear function, bilaterally. [name] felt a slight pop with left ear tympanogram.  Audiometric testing was completed using Conventional Audiometry techniques with insert earphones and supraural headphones. Test results are consistent with normal hearing sloping after 2kHz to a moderate sensorineural  hearing loss in the right ear. Right ear loss typical with age related change. His left ear has a profound sensorineural hearing loss 250-8kHz. Speech Recognition Threshold at  25dB HL in the right ear and at Speech Detection threshold  85dB HL in the left ear with masking. Word Recognition Testing was completed at  40dB SL  and Treyon scored 100% in the right ear. Left ear 0% at 90dB.    Results:  The test results were reviewed with [facility]. [name] has normal hearing sloping after 2kHz to a moderate sensorineural  hearing loss in the right ear. Right ear loss typical with age related change. His left ear has a profound sensorineural hearing loss 250-8kHz.  He needs follow up as soon as possible with Otolaryngology due to the sudden onset of his left ear sensorineural  hearing loss. [name]'s loss is not consistent with congestion or allergies. There is no fluid in the canal from swimming. Middle ear function normal. He was advised that the hearing loss is in the inner ear. He will likely need steroids and imaging. Audiogram printed and provided to [facility].      Recommendations: Follow up scheduled with Juliane Cohen, PA today at 2:30pm with [facility] due to sudden onset of unilateral sensorineural hearing loss in the left ear.  After plan of care established with Otolaryngology, recommend follow up audiologic testing. Rannie can be seen at [facility] or with [facility] ENT audiologist.    41 minutes spent testing and counseling on results.   If you have  any questions please feel free to contact me at [phone].  Lauraine Ka Stalnaker Au.D.  Audiologist   09/05/2023  11:28 AM  Cc: Geofm Glade PARAS, MD

## 2023-09-05 NOTE — Patient Instructions (Signed)
 I have ordered an imaging study for you to complete prior to your next visit. Please call [facility] Scheduling at [phone] to schedule your imaging if you have not received a call within 24 hours. If you are unable to complete your imaging study prior to your next scheduled visit please call our office to let us  know.

## 2023-09-05 NOTE — Progress Notes (Signed)
 Dear Dr. Geofm, Here is my assessment for our mutual patient, Mike Ray. Thank you for allowing me the opportunity to care for your patient. Please do not hesitate to contact me should you have any other questions. Sincerely, [name] Cohen PA-C  Otolaryngology Clinic Note Referring provider: Dr. Geofm HPI:  Mike Ray is a 73 y.o. male kindly referred by Dr. Geofm   The patient is a 73 year old gentleman seen in our office for follow-up evaluation of hearing loss.  The patient was last seen in the office on 08/24/2023.  Below is recap of that encounter.   The patient is a 73 year old gentleman presenting today with left-sided ear related complaints.  The patient notes approximate 2 weeks ago he felt fullness in his left ear as if he had water in it.  He notes he is an avid swimmer and uses eardrops to remove the water but feels like this has not helped.  He also noted abnormal perception of hearing on the left as if he was in a tin can.  He notes occasional tonal tinnitus and an occasional dripping sound in the left ear.  Denies any associated pain, no dizziness.  He notes at baseline his hearing is always been worse on the left when compared to right but it has been significantly worse over the last several weeks.  He notes he followed up with his primary care provider, they diagnosed him with otitis externa and started on ciprofloxacin  and otic drops.  He notes these have not significantly improved his symptoms.  He does have some associated dizziness which he describes as unsteadiness when he goes from laying down to standing up.  He denies any associated trauma to his ear, no history of recurrent ear infections as a child or an adult, no ear surgery.  Update 09/05/2023  He notes since his last office visit he is denies any significant changes.  He notes the hearing has remained the same since initial onset which was approximately 2 weeks prior to his original visit with me; this was an  abrupt onset.  He notes that he did have some minimal unsteadiness when standing up but he notes this has improved and denies any acute dizziness.  He notes no baseline tinnitus but does note that he mowed the lawn the other day and for approximately 5 minutes after mowing the lawn he did have quite loud left-sided tinnitus.  He still feels a sensation like there is water in his ear.     Independent Review of Additional Tests or Records:  Audiological evaluation on 09/05/2023   History: Jabes was seen for an audiological evaluation due to sudden onset of sensorineural hearing loss in the left ear three weeks ago. 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 He hears a intermittent loud sound like rushing water. He has random moments of imbalance. He went to urgent care and was prescribed Flonase  and allergy medication. Nothing has helped. He thinks he at one point was prescribed a steroid but this was not found in medical review. He saw Juliane Cohen, [facility] on 08/24/23 who ordered the hearing test.  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  Medical history shows no other risk for hearing loss.      Evaluation:  Otoscopy showed a clear view of the tympanic membranes, bilaterally Tympanometry results were consistent with normal middle ear function, bilaterally. Imari felt a slight pop  with left ear tympanogram.  Audiometric testing was completed using Conventional Audiometry techniques with insert earphones and supraural headphones. Test results are consistent with normal hearing sloping after 2kHz to a moderate sensorineural  hearing loss in the right ear. Right ear loss typical with age related change. His left ear has a profound sensorineural hearing loss 250-8kHz. Speech Recognition Threshold at  25dB HL in the right ear and at Speech Detection threshold  85dB HL in the left ear with masking. Word  Recognition Testing was completed at  40dB SL and Sher scored 100% in the right ear. Left ear 0% at 90dB.     Results:  The test results were reviewed with [facility]. Braian has normal hearing sloping after 2kHz to a moderate sensorineural  hearing loss in the right ear. Right ear loss typical with age related change. His left ear has a profound sensorineural hearing loss 250-8kHz.  He needs follow up as soon as possible with Otolaryngology due to the sudden onset of his left ear sensorineural  hearing loss. Edgar's loss is not consistent with congestion or allergies. There is no fluid in the canal from swimming. Middle ear function normal. He was advised that the hearing loss is in the inner ear. He will likely need steroids and imaging. Audiogram printed and provided to Daniels.         Recommendations: Follow up scheduled with Juliane Cohen, PA today at 2:30pm with [facility] due to sudden onset of unilateral sensorineural hearing loss in the left ear.  After plan of care established with Otolaryngology, recommend follow up audiologic testing. Blanche can be seen at [facility] or with [facility] ENT audiologist.     PMH/Meds/All/SocHx/FamHx/ROS:   Past Medical History:  Diagnosis Date   Acute MI, inferolateral wall, initial episode of care (HCC) 07/30/2015   Coronary artery disease    Elevated PSA    Dr Donelle   Gilbert's syndrome    History of heat stroke 2009   History of nephrolithiasis    Hyperlipidemia    NMR 06/2009: LDL 88(1345/912), HDL 40, TG 127. Framingham Study LDL goal =< 130    Hypertension    Osteoarthritis of left hip    hip replacement right and left     Past Surgical History:  Procedure Laterality Date   CARDIAC CATHETERIZATION     CARDIAC CATHETERIZATION N/A 07/30/2015   Procedure: Left Heart Cath and Coronary Angiography;  Surgeon: Ozell Fell, MD;  Location: [facility] INVASIVE CV LAB;  Service: Cardiovascular;  Laterality: N/A;   CARDIAC CATHETERIZATION N/A  07/30/2015   Procedure: Coronary Stent Intervention;  Surgeon: Ozell Fell, MD;  Location: [facility] INVASIVE CV LAB;  Service: Cardiovascular;  Laterality: N/A;  Distal RCA- Promus 3.50x16   COLONOSCOPY  2011   negative   INGUINAL HERNIA REPAIR Right 10/19/2017   Procedure: RIGHT  INGUINAL HERNIA REPAIR ERAS PATHWAY;  Surgeon: Vanderbilt Ned, MD;  Location: [facility];  Service: General;  Laterality: Right;  Tap block   INSERTION OF MESH Right 10/19/2017   Procedure: INSERTION OF MESH;  Surgeon: Vanderbilt Ned, MD;  Location: [facility];  Service: General;  Laterality: Right;   LITHOTRIPSY  2009   Dr.Duckett, [place]    TOTAL HIP ARTHROPLASTY  2008   left   TOTAL HIP ARTHROPLASTY  2004   right    Family History  Problem Relation Age of Onset   Diabetes Mother    Hypertension Mother    Heart attack Father 25  smoker   Prostate cancer Brother 54       CAD; stents @ 56   Hypertension Brother    Heart attack Brother 18   Coronary artery disease Brother        stents late 95s   Stroke Neg Hx    Colon cancer Neg Hx    Colon polyps Neg Hx    Esophageal cancer Neg Hx    Rectal cancer Neg Hx    Stomach cancer Neg Hx      Social Connections: Socially Integrated (06/06/2023)   Social Connection and Isolation Panel    Frequency of Communication with Friends and Family: More than three times a week    Frequency of Social Gatherings with Friends and Family: More than three times a week    Attends Religious Services: More than 4 times per year    Active Member of [employer] or Organizations: Yes    Attends Engineer, structural: More than 4 times per year    Marital Status: Married      Current Outpatient Medications:    amLODipine  (NORVASC ) 10 MG tablet, Take 1 tablet by mouth once daily, Disp: 90 tablet, Rfl: 0   Ascorbic Acid (SUPER C COMPLEX PO), Take by mouth 2 (two) times daily., Disp: , Rfl:    aspirin  81 MG tablet, Take 81 mg by mouth  daily., Disp: , Rfl:    atorvastatin  (LIPITOR ) 80 MG tablet, TAKE 1 TABLET BY MOUTH ONCE DAILY IN THE EVENING, Disp: 90 tablet, Rfl: 1   calcium  carbonate (TUMS - DOSED IN MG ELEMENTAL CALCIUM ) 500 MG chewable tablet, Chew 1 tablet by mouth daily as needed., Disp: , Rfl:    Coenzyme Q10 (COQ10) 100 MG CAPS, Take 1 capsule by mouth daily., Disp: , Rfl:    famotidine (PEPCID) 20 MG tablet, Take 20 mg by mouth daily as needed for heartburn or indigestion., Disp: , Rfl:    fluticasone  (FLONASE ) 50 MCG/ACT nasal spray, Place 2 sprays into both nostrils daily., Disp: 16 g, Rfl: 6   loratadine  (CLARITIN ) 10 MG tablet, Take 1 tablet (10 mg total) by mouth daily., Disp: 30 tablet, Rfl: 11   losartan  (COZAAR ) 100 MG tablet, TAKE 1 TABLET BY MOUTH EVERY DAY, Disp: 90 tablet, Rfl: 3   meloxicam (MOBIC) 15 MG tablet, Take 15 mg by mouth daily., Disp: , Rfl:    metoprolol  tartrate (LOPRESSOR ) 25 MG tablet, Take 1/2 (one-half) tablet by mouth twice daily, Disp: 90 tablet, Rfl: 0   Multiple Vitamin (MULTIVITAMIN) tablet, Take 1 tablet by mouth every other day.  (Patient taking differently: Take 1 tablet by mouth daily.), Disp: , Rfl:    neomycin -polymyxin-hydrocortisone  (CORTISPORIN) OTIC solution, Place 3 drops into the left ear 4 (four) times daily., Disp: 10 mL, Rfl: 0   nitroGLYCERIN  (NITROSTAT ) 0.4 MG SL tablet, Dissolve 1 tablet under the tongue every 5 minutes as needed for chest pain. Max of 3 doses, then 911, Disp: 25 tablet, Rfl: 3   sildenafil  (REVATIO ) 20 MG tablet, TAKE 2 TO 5 TABLETS BY MOUTH AS NEEDED FOR ERECTILE DYSFUNCTION. DO NOT USE WITH NITROGLYCERIN , Disp: 90 tablet, Rfl: 6   Physical Exam:   There were no vitals taken for this visit.  Pertinent Findings  CN II-XII intact Bilateral EAC clear and TM intact with well pneumatized middle ear spaces No obvious neck masses/lymphadenopathy/thyromegaly No respiratory distress or stridor  Seprately Identifiable Procedures:  None  Impression &  Plans:  Rune Mendez is a 73 y.o.  male with the following   Hearing loss-  73 year old male seen in our office for follow-up evaluation discussion of audiological results.  He did get his audiogram completed today, it was noted to have asymmetric left-sided sensorineural hearing loss.  Given his history and timing I do think it reasonable to treat this with oral steroids including a prednisone  taper.  This has been sent into the pharmacy.  I did discuss the potential side effects of steroids including GI risks, insomnia, irritability; he is not diabetic.  I also recommended MRI IAC to evaluate the acute change.  I would like to see him back in the office in 1 week for repeat evaluation, at that time if he is not having any significant improvement in his hearing despite oral steroids I will discuss the case with attending physician for potential intratympanic injections.  The patient will have a 2-week audiological follow-up as well.  He understands to reach out to the office immediately if he develops any new or worsening signs or symptoms in the meantime.   - f/u 1 week in office, two week audio, MR IAC   Thank you for allowing me the opportunity to care for your patient. Please do not hesitate to contact me should you have any other questions.  Sincerely, [name] Cohen PA-C [facility] Phone: [phone] Fax: [phone]  09/05/2023, 2:20 PM

## 2023-09-07 ENCOUNTER — Ambulatory Visit (HOSPITAL_COMMUNITY)
Admission: RE | Admit: 2023-09-07 | Discharge: 2023-09-07 | Disposition: A | Discharge status: Home / Self Care | Source: Ambulatory Visit | Attending: Physician Assistant | Admitting: Physician Assistant

## 2023-09-07 DIAGNOSIS — H9122 Sudden idiopathic hearing loss, left ear: Secondary | ICD-10-CM | POA: Insufficient documentation

## 2023-09-07 DIAGNOSIS — H6090 Unspecified otitis externa, unspecified ear: Secondary | ICD-10-CM | POA: Diagnosis not present

## 2023-09-07 DIAGNOSIS — I6782 Cerebral ischemia: Secondary | ICD-10-CM | POA: Diagnosis not present

## 2023-09-07 DIAGNOSIS — H9209 Otalgia, unspecified ear: Secondary | ICD-10-CM | POA: Diagnosis not present

## 2023-09-07 MED ORDER — GADOBUTROL 1 MMOL/ML IV SOLN
10.0000 mL | Freq: Once | INTRAVENOUS | Status: AC | PRN
  Administered 2023-09-07 (×2): 10 mL via INTRAVENOUS

## 2023-09-09 ENCOUNTER — Ambulatory Visit (INDEPENDENT_AMBULATORY_CARE_PROVIDER_SITE_OTHER): Admitting: Physician Assistant

## 2023-09-12 ENCOUNTER — Other Ambulatory Visit: Payer: Self-pay | Admitting: Internal Medicine

## 2023-09-16 ENCOUNTER — Encounter (INDEPENDENT_AMBULATORY_CARE_PROVIDER_SITE_OTHER): Payer: Self-pay | Admitting: Physician Assistant

## 2023-09-16 ENCOUNTER — Ambulatory Visit (INDEPENDENT_AMBULATORY_CARE_PROVIDER_SITE_OTHER): Admitting: Physician Assistant

## 2023-09-16 VITALS — BP 118/76 | HR 59

## 2023-09-16 DIAGNOSIS — H9122 Sudden idiopathic hearing loss, left ear: Secondary | ICD-10-CM | POA: Diagnosis not present

## 2023-09-16 NOTE — Progress Notes (Signed)
 Dear Dr. Geofm, Here is my assessment for our mutual patient, Mike Ray. Thank you for allowing me the opportunity to care for your patient. Please do not hesitate to contact me should you have any other questions. Sincerely, [name] Cohen PA-C  Otolaryngology Clinic Note Referring provider: Dr. Geofm HPI:  Mike Ray is a 73 y.o. male kindly referred by Dr. Geofm   The patient is a 73 year old gentleman seen in our office for follow-up evaluation of sudden hearing loss.  The patient was last seen in the office on 09/05/2023.  Below is a recap of that encounter.  The patient is a 73 year old gentleman seen in our office for follow-up evaluation of hearing loss.  The patient was last seen in the office on 08/24/2023.  Below is recap of that encounter.     The patient is a 73 year old gentleman presenting today with left-sided ear related complaints.  The patient notes approximate 2 weeks ago he felt fullness in his left ear as if he had water in it.  He notes he is an avid swimmer and uses eardrops to remove the water but feels like this has not helped.  He also noted abnormal perception of hearing on the left as if he was in a tin can.  He notes occasional tonal tinnitus and an occasional dripping sound in the left ear.  Denies any associated pain, no dizziness.  He notes at baseline his hearing is always been worse on the left when compared to right but it has been significantly worse over the last several weeks.  He notes he followed up with his primary care provider, they diagnosed him with otitis externa and started on ciprofloxacin  and otic drops.  He notes these have not significantly improved his symptoms.  He does have some associated dizziness which he describes as unsteadiness when he goes from laying down to standing up.  He denies any associated trauma to his ear, no history of recurrent ear infections as a child or an adult, no ear surgery.   Update 09/05/2023   He notes since  his last office visit he is denies any significant changes.  He notes the hearing has remained the same since initial onset which was approximately 2 weeks prior to his original visit with me; this was an abrupt onset.  He notes that he did have some minimal unsteadiness when standing up but he notes this has improved and denies any acute dizziness.  He notes no baseline tinnitus but does note that he mowed the lawn the other day and for approximately 5 minutes after mowing the lawn he did have quite loud left-sided tinnitus.  He still feels a sensation like there is water in his ear.  Update 09/16/2023  His last office visit he denies any changes to his hearing, he continues to use the steroids.  He notes some continued episodes of intermittent dizziness, usually when he turns his head to the side, worse when he is laying in bed and turning his head to the side.  The dizziness last approximately 2 to 3 seconds with no other associated neurologic deficits or persistence.     Independent Review of Additional Tests or Records:  None   PMH/Meds/All/SocHx/FamHx/ROS:   Past Medical History:  Diagnosis Date   Acute MI, inferolateral wall, initial episode of care (HCC) 07/30/2015   Coronary artery disease    Elevated PSA    Dr Donelle   Gilbert's syndrome    History of heat stroke 2009  History of nephrolithiasis    Hyperlipidemia    NMR 06/2009: LDL 88(1345/912), HDL 40, TG 127. Framingham Study LDL goal =< 130    Hypertension    Osteoarthritis of left hip    hip replacement right and left     Past Surgical History:  Procedure Laterality Date   CARDIAC CATHETERIZATION     CARDIAC CATHETERIZATION N/A 07/30/2015   Procedure: Left Heart Cath and Coronary Angiography;  Surgeon: Ozell Fell, MD;  Location: [facility] INVASIVE CV LAB;  Service: Cardiovascular;  Laterality: N/A;   CARDIAC CATHETERIZATION N/A 07/30/2015   Procedure: Coronary Stent Intervention;  Surgeon: Ozell Fell, MD;  Location: [facility]  INVASIVE CV LAB;  Service: Cardiovascular;  Laterality: N/A;  Distal RCA- Promus 3.50x16   COLONOSCOPY  2011   negative   INGUINAL HERNIA REPAIR Right 10/19/2017   Procedure: RIGHT  INGUINAL HERNIA REPAIR ERAS PATHWAY;  Surgeon: Vanderbilt Ned, MD;  Location: [facility];  Service: General;  Laterality: Right;  Tap block   INSERTION OF MESH Right 10/19/2017   Procedure: INSERTION OF MESH;  Surgeon: Vanderbilt Ned, MD;  Location: [facility];  Service: General;  Laterality: Right;   LITHOTRIPSY  2009   Dr.Duckett, [place]    TOTAL HIP ARTHROPLASTY  2008   left   TOTAL HIP ARTHROPLASTY  2004   right    Family History  Problem Relation Age of Onset   Diabetes Mother    Hypertension Mother    Heart attack Father 26       smoker   Prostate cancer Brother 50       CAD; stents @ 56   Hypertension Brother    Heart attack Brother 50   Coronary artery disease Brother        stents late 50s   Stroke Neg Hx    Colon cancer Neg Hx    Colon polyps Neg Hx    Esophageal cancer Neg Hx    Rectal cancer Neg Hx    Stomach cancer Neg Hx      Social Connections: Socially Integrated (06/06/2023)   Social Connection and Isolation Panel    Frequency of Communication with Friends and Family: More than three times a week    Frequency of Social Gatherings with Friends and Family: More than three times a week    Attends Religious Services: More than 4 times per year    Active Member of [employer] or Organizations: Yes    Attends Engineer, structural: More than 4 times per year    Marital Status: Married      Current Outpatient Medications:    amLODipine  (NORVASC ) 10 MG tablet, Take 1 tablet by mouth once daily, Disp: 90 tablet, Rfl: 0   Ascorbic Acid (SUPER C COMPLEX PO), Take by mouth 2 (two) times daily., Disp: , Rfl:    aspirin  81 MG tablet, Take 81 mg by mouth daily., Disp: , Rfl:    atorvastatin  (LIPITOR ) 80 MG tablet, TAKE 1 TABLET BY MOUTH ONCE DAILY IN  THE EVENING, Disp: 90 tablet, Rfl: 1   calcium  carbonate (TUMS - DOSED IN MG ELEMENTAL CALCIUM ) 500 MG chewable tablet, Chew 1 tablet by mouth daily as needed., Disp: , Rfl:    Coenzyme Q10 (COQ10) 100 MG CAPS, Take 1 capsule by mouth daily., Disp: , Rfl:    famotidine (PEPCID) 20 MG tablet, Take 20 mg by mouth daily as needed for heartburn or indigestion., Disp: , Rfl:    fluticasone  (FLONASE )  50 MCG/ACT nasal spray, Place 2 sprays into both nostrils daily., Disp: 16 g, Rfl: 6   loratadine  (CLARITIN ) 10 MG tablet, Take 1 tablet (10 mg total) by mouth daily., Disp: 30 tablet, Rfl: 11   losartan  (COZAAR ) 100 MG tablet, TAKE 1 TABLET BY MOUTH EVERY DAY, Disp: 90 tablet, Rfl: 3   meloxicam (MOBIC) 15 MG tablet, Take 15 mg by mouth daily., Disp: , Rfl:    metoprolol  tartrate (LOPRESSOR ) 25 MG tablet, Take 1/2 (one-half) tablet by mouth twice daily, Disp: 90 tablet, Rfl: 0   Multiple Vitamin (MULTIVITAMIN) tablet, Take 1 tablet by mouth every other day.  (Patient taking differently: Take 1 tablet by mouth daily.), Disp: , Rfl:    neomycin -polymyxin-hydrocortisone  (CORTISPORIN) OTIC solution, Place 3 drops into the left ear 4 (four) times daily., Disp: 10 mL, Rfl: 0   nitroGLYCERIN  (NITROSTAT ) 0.4 MG SL tablet, Dissolve 1 tablet under the tongue every 5 minutes as needed for chest pain. Max of 3 doses, then 911, Disp: 25 tablet, Rfl: 3   predniSONE  (DELTASONE ) 10 MG tablet, Take 6 tablets (60 mg) by mouth once daily for 7 days, then 5 tablets (50 mg) once daily for 2 days, then 4 tablets (40 mg) once daily for 2 days, then 3 tablets (30 mg) once daily for 2 days, then 2 tablets (20 mg) once daily for 2 days, then 1 tablet (10 mg) once daily for 2 days, then stop, Disp: 72 tablet, Rfl: 0   sildenafil  (REVATIO ) 20 MG tablet, TAKE 2 TO 5 TABLETS BY MOUTH AS NEEDED FOR ERECTILE DYSFUNCTION. DO NOT USE WITH NITROGLYCERIN , Disp: 90 tablet, Rfl: 6   Physical Exam:   BP 118/76   Pulse (!) 59   SpO2 95%    Pertinent Findings  CN II-XII intact Bilateral EAC clear and TM intact with well pneumatized middle ear spaces No obvious neck masses/lymphadenopathy/thyromegaly No respiratory distress or stridor   Seprately Identifiable Procedures:  None  Impression & Plans:  Chris Cripps is a 73 y.o. male with the following   Sudden hearing loss-  Left side Onset 08/09/2023 Initial audiogram 09/05/2023 Prednisone  taper in progress MRI IAC completed waiting for results Repeat audiogram scheduled for 09/19/2023 Interval changes-no changes to hearing, continued episodic positional vertigo  Plan to continue monitoring his symptoms, audiological evaluation on Monday with office visit.  Return immediately if any new or worsening signs or symptoms present   - f/u office visit 09/19/2023   Thank you for allowing me the opportunity to care for your patient. Please do not hesitate to contact me should you have any other questions.  Sincerely, [name] Cohen PA-C [facility] Phone: [phone] Fax: [phone]  09/16/2023, 10:30 AM

## 2023-09-19 ENCOUNTER — Ambulatory Visit (INDEPENDENT_AMBULATORY_CARE_PROVIDER_SITE_OTHER): Admitting: Physician Assistant

## 2023-09-19 ENCOUNTER — Encounter (INDEPENDENT_AMBULATORY_CARE_PROVIDER_SITE_OTHER): Payer: Self-pay | Admitting: Physician Assistant

## 2023-09-19 ENCOUNTER — Ambulatory Visit (INDEPENDENT_AMBULATORY_CARE_PROVIDER_SITE_OTHER): Admitting: Audiology

## 2023-09-19 VITALS — BP 120/77 | HR 68

## 2023-09-19 DIAGNOSIS — H9122 Sudden idiopathic hearing loss, left ear: Secondary | ICD-10-CM

## 2023-09-19 DIAGNOSIS — H903 Sensorineural hearing loss, bilateral: Secondary | ICD-10-CM

## 2023-09-19 NOTE — Progress Notes (Signed)
  [address] [place], [place] [address] [phone]  Audiological Evaluation    Name: Mike Ray     DOB:   08-07-1950      MRN:   [id number]                                                                                     Service Date: 09/19/2023     Accompanied by: unaccompanied   Patient comes today after Reyes Cohen, PA-C sent a referral for a hearing evaluation due to concerns with sudden hearing loss.   Symptoms Yes Details  Hearing loss  [x]  09-05-23:normal hearing sloping after 2kHz to a moderate sensorineural  hearing loss in the right ear left ear has a profound sensorineural hearing loss 250-8kHz   Tinnitus  [x]  left - sounds like water  Ear pain/ infections/pressure  []    Balance problems  [x]  Reports some 4 to 5 spins when he moves his head  Noise exposure history  []    Previous ear surgeries  []    Family history of hearing loss  []    Amplification  []    Other  []       Otoscopy: Right ear: Clear external ear canal and notable landmarks visualized on the tympanic membrane. Left ear:  Clear external ear canal and notable landmarks visualized on the tympanic membrane.  Tympanometry: Right ear: Type A- Normal external ear canal volume with normal middle ear pressure and tympanic membrane compliance. Left ear: Type A- Normal external ear canal volume with normal middle ear pressure and tympanic membrane compliance.  Pure tone Audiometry: Right ear- Normal to moderate sensorineural hearing loss from 125 Hz - 8000 Hz. Left ear-  Moderate to profound essentially sensorineural hearing loss from 125 Hz - 8000 Hz.  Speech Audiometry: Right ear- Speech Reception Threshold (SRT) was obtained at 20 dBHL. Left ear-Speech Awareness Threshold (SAT) was observed at 70 dBHL, with contralateral masking.   Word Recognition Score Tested using NU-6 (recorded) Right ear: 96% was obtained at a presentation level of 70 dBHL without contralateral masking  which is deemed as  excellent. Left ear: Could not test due to degree of hearing loss.   The hearing test results were completed under headphones and results are deemed to be of good reliability. Test technique:  conventional    Impression: Today's audiogram show a slight improvement in the left ear responses when compared to his last audiogram on file from 09-05-2023.   Recommendations: Follow up with ENT as scheduled for today. Return for a hearing evaluation if concerns with hearing changes arise or per MD recommendation. Consider a communication needs assessment after medical clearance for amplification is obtained.   Amberlie Gaillard MARIE LEROUX-MARTINEZ, AUD

## 2023-09-19 NOTE — Progress Notes (Signed)
 Dear Dr. Geofm, Here is my assessment for our mutual patient, Mike Ray. Thank you for allowing me the opportunity to care for your patient. Please do not hesitate to contact me should you have any other questions. Sincerely, [name] Cohen PA-C  Otolaryngology Clinic Note Referring provider: Dr. Geofm HPI:  Mike Ray is a 73 y.o. Ray kindly referred by Dr. Geofm   The patient is a 73 year old Ray seen in our office for follow-up evaluation of asymmetric hearing loss.  The patient was last seen in the office on 09/14/2023.  Below is recap of that encounter.  The patient is a 73 year old gentleman presenting today with left-sided ear related complaints.  The patient notes approximate 2 weeks ago he felt fullness in his left ear as if he had water in it.  He notes he is an avid swimmer and uses eardrops to remove the water but feels like this has not helped.  He also noted abnormal perception of hearing on the left as if he was in a tin can.  He notes occasional tonal tinnitus and an occasional dripping sound in the left ear.  Denies any associated pain, no dizziness.  He notes at baseline his hearing is always been worse on the left when compared to right but it has been significantly worse over the last several weeks.  He notes he followed up with his primary care provider, they diagnosed him with otitis externa and started on ciprofloxacin  and otic drops.  He notes these have not significantly improved his symptoms.  He does have some associated dizziness which he describes as unsteadiness when he goes from laying down to standing up.  He denies any associated trauma to his ear, no history of recurrent ear infections as a child or an adult, no ear surgery.   Update 09/05/2023   He notes since his last office visit he is denies any significant changes.  He notes the hearing has remained the same since initial onset which was approximately 2 weeks prior to his original visit with me; this was  an abrupt onset.  He notes that he did have some minimal unsteadiness when standing up but he notes this has improved and denies any acute dizziness.  He notes no baseline tinnitus but does note that he mowed the lawn the other day and for approximately 5 minutes after mowing the lawn he did have quite loud left-sided tinnitus.  He still feels a sensation like there is water in his ear.   Update 09/16/2023   His last office visit he denies any changes to his hearing, he continues to use the steroids.  He notes some continued episodes of intermittent dizziness, usually when he turns his head to the side, worse when he is laying in bed and turning his head to the side.  The dizziness last approximately 2 to 3 seconds with no other associated neurologic deficits or persistence.  Update 09/19/2023.    Since his last office visit he notes that he has perceived may be some improvement in hearing in the left ear although he is having difficulty saying with certainty.  He denies any significant other changes.  He notes still some positional dizziness.  He also reports the sound of water dripping in his left ear, no pulsatile nature.  Independent Review of Additional Tests or Records:   Audiological evaluation 09/19/2023  Otoscopy: Right ear: Clear external ear canal and notable landmarks visualized on the tympanic membrane. Left ear:  Clear external ear canal  and notable landmarks visualized on the tympanic membrane.   Tympanometry: Right ear: Type A- Normal external ear canal volume with normal middle ear pressure and tympanic membrane compliance. Left ear: Type A- Normal external ear canal volume with normal middle ear pressure and tympanic membrane compliance.   Pure tone Audiometry: Right ear- Normal to moderate sensorineural hearing loss from 125 Hz - 8000 Hz. Left ear-  Moderate to profound essentially sensorineural hearing loss from 125 Hz - 8000 Hz.   Speech Audiometry: Right ear- Speech  Reception Threshold (SRT) was obtained at 20 dBHL. Left ear-Speech Awareness Threshold (SAT) was observed at 70 dBHL, with contralateral masking.   Word Recognition Score Tested using NU-6 (recorded) Right ear: 96% was obtained at a presentation level of 70 dBHL without contralateral masking which is deemed as  excellent. Left ear: Could not test due to degree of hearing loss.   The hearing test results were completed under headphones and results are deemed to be of good reliability. Test technique:  conventional     Impression: Today's audiogram show a slight improvement in the left ear responses when compared to his last audiogram on file from 09-05-2023.    PMH/Meds/All/SocHx/FamHx/ROS:   Past Medical History:  Diagnosis Date   Acute MI, inferolateral wall, initial episode of care (HCC) 07/30/2015   Coronary artery disease    Elevated PSA    Dr Donelle   Gilbert's syndrome    History of heat stroke 2009   History of nephrolithiasis    Hyperlipidemia    NMR 06/2009: LDL 88(1345/912), HDL 40, TG 127. Framingham Study LDL goal =< 130    Hypertension    Osteoarthritis of left hip    hip replacement right and left     Past Surgical History:  Procedure Laterality Date   CARDIAC CATHETERIZATION     CARDIAC CATHETERIZATION N/A 07/30/2015   Procedure: Left Heart Cath and Coronary Angiography;  Surgeon: Ozell Fell, MD;  [facility] INVASIVE CV LAB;  Service: Cardiovascular;  Laterality: N/A;   CARDIAC CATHETERIZATION N/A 07/30/2015   Procedure: Coronary Stent Intervention;  Surgeon: Ozell Fell, MD;  Location: [facility] INVASIVE CV LAB;  Service: Cardiovascular;  Laterality: N/A;  Distal RCA- Promus 3.50x16   COLONOSCOPY  2011   negative   INGUINAL HERNIA REPAIR Right 10/19/2017   Procedure: RIGHT  INGUINAL HERNIA REPAIR ERAS PATHWAY;  Surgeon: Vanderbilt Ned, MD;  Location: [facility];  Service: General;  Laterality: Right;  Tap block   INSERTION OF MESH Right 10/19/2017    Procedure: INSERTION OF MESH;  Surgeon: Vanderbilt Ned, MD;  Location: [facility];  Service: General;  Laterality: Right;   LITHOTRIPSY  2009   Dr.Duckett, [place]    TOTAL HIP ARTHROPLASTY  2008   left   TOTAL HIP ARTHROPLASTY  2004   right    Family History  Problem Relation Age of Onset   Diabetes Mother    Hypertension Mother    Heart attack Father 53       smoker   Prostate cancer Brother 56       CAD; stents @ 56   Hypertension Brother    Heart attack Brother 87   Coronary artery disease Brother        stents late 50s   Stroke Neg Hx    Colon cancer Neg Hx    Colon polyps Neg Hx    Esophageal cancer Neg Hx    Rectal cancer Neg Hx  Stomach cancer Neg Hx      Social Connections: Socially Integrated (06/06/2023)   Social Connection and Isolation Panel    Frequency of Communication with Friends and Family: More than three times a week    Frequency of Social Gatherings with Friends and Family: More than three times a week    Attends Religious Services: More than 4 times per year    Active Member of [employer] or Organizations: Yes    Attends Engineer, structural: More than 4 times per year    Marital Status: Married      Current Outpatient Medications:    amLODipine  (NORVASC ) 10 MG tablet, Take 1 tablet by mouth once daily, Disp: 90 tablet, Rfl: 0   Ascorbic Acid (SUPER C COMPLEX PO), Take by mouth 2 (two) times daily., Disp: , Rfl:    aspirin  81 MG tablet, Take 81 mg by mouth daily., Disp: , Rfl:    atorvastatin  (LIPITOR ) 80 MG tablet, TAKE 1 TABLET BY MOUTH ONCE DAILY IN THE EVENING, Disp: 90 tablet, Rfl: 1   calcium  carbonate (TUMS - DOSED IN MG ELEMENTAL CALCIUM ) 500 MG chewable tablet, Chew 1 tablet by mouth daily as needed., Disp: , Rfl:    Coenzyme Q10 (COQ10) 100 MG CAPS, Take 1 capsule by mouth daily., Disp: , Rfl:    famotidine (PEPCID) 20 MG tablet, Take 20 mg by mouth daily as needed for heartburn or indigestion., Disp: , Rfl:     fluticasone  (FLONASE ) 50 MCG/ACT nasal spray, Place 2 sprays into both nostrils daily., Disp: 16 g, Rfl: 6   loratadine  (CLARITIN ) 10 MG tablet, Take 1 tablet (10 mg total) by mouth daily., Disp: 30 tablet, Rfl: 11   losartan  (COZAAR ) 100 MG tablet, TAKE 1 TABLET BY MOUTH EVERY DAY, Disp: 90 tablet, Rfl: 3   meloxicam (MOBIC) 15 MG tablet, Take 15 mg by mouth daily., Disp: , Rfl:    metoprolol  tartrate (LOPRESSOR ) 25 MG tablet, Take 1/2 (one-half) tablet by mouth twice daily, Disp: 90 tablet, Rfl: 0   Multiple Vitamin (MULTIVITAMIN) tablet, Take 1 tablet by mouth every other day.  (Patient taking differently: Take 1 tablet by mouth daily.), Disp: , Rfl:    neomycin -polymyxin-hydrocortisone  (CORTISPORIN) OTIC solution, Place 3 drops into the left ear 4 (four) times daily., Disp: 10 mL, Rfl: 0   nitroGLYCERIN  (NITROSTAT ) 0.4 MG SL tablet, Dissolve 1 tablet under the tongue every 5 minutes as needed for chest pain. Max of 3 doses, then 911, Disp: 25 tablet, Rfl: 3   predniSONE  (DELTASONE ) 10 MG tablet, Take 6 tablets (60 mg) by mouth once daily for 7 days, then 5 tablets (50 mg) once daily for 2 days, then 4 tablets (40 mg) once daily for 2 days, then 3 tablets (30 mg) once daily for 2 days, then 2 tablets (20 mg) once daily for 2 days, then 1 tablet (10 mg) once daily for 2 days, then stop, Disp: 72 tablet, Rfl: 0   sildenafil  (REVATIO ) 20 MG tablet, TAKE 2 TO 5 TABLETS BY MOUTH AS NEEDED FOR ERECTILE DYSFUNCTION. DO NOT USE WITH NITROGLYCERIN , Disp: 90 tablet, Rfl: 6   Physical Exam:   BP 120/77   Pulse 68   SpO2 94%   Pertinent Findings  CN II-XII intact Bilateral EAC clear and TM intact with well pneumatized middle ear spaces No obvious neck masses/lymphadenopathy/thyromegaly No respiratory distress or stridor  Seprately Identifiable Procedures:  None  Impression & Plans:  Mike Ray is a 73 y.o. Ray with  the following    Sudden hearing loss-   Left side Onset  08/09/2023 Initial audiogram 09/05/2023 Repeat audiogram 09/19/2023- slight improvement Prednisone  taper in progress MRI IAC completed - negative for acute findings  Interval changes-slight objective/subjective improvement in hearing   Plan to continue monitoring his symptoms, audiological evaluation on Monday with office visit.  Return immediately for intratympanic steroid injection 09/22/2023   - f/u 8/28/205   Thank you for allowing me the opportunity to care for your patient. Please do not hesitate to contact me should you have any other questions.  Sincerely, [name] Cohen PA-C [facility] Phone: [phone] Fax: [phone]  09/19/2023, 9:23 AM

## 2023-09-22 ENCOUNTER — Encounter (INDEPENDENT_AMBULATORY_CARE_PROVIDER_SITE_OTHER): Payer: Self-pay | Admitting: Otolaryngology

## 2023-09-22 ENCOUNTER — Telehealth (INDEPENDENT_AMBULATORY_CARE_PROVIDER_SITE_OTHER): Payer: Self-pay | Admitting: Otolaryngology

## 2023-09-22 ENCOUNTER — Ambulatory Visit (INDEPENDENT_AMBULATORY_CARE_PROVIDER_SITE_OTHER): Admitting: Otolaryngology

## 2023-09-22 VITALS — BP 108/69 | HR 73 | Ht 70.0 in | Wt 216.0 lb

## 2023-09-22 DIAGNOSIS — H9122 Sudden idiopathic hearing loss, left ear: Secondary | ICD-10-CM

## 2023-09-22 DIAGNOSIS — H903 Sensorineural hearing loss, bilateral: Secondary | ICD-10-CM

## 2023-09-22 NOTE — Telephone Encounter (Signed)
 The patient called in checking to see he if he needs to keep the audio he has scheduled on 9/2 since he had an audio here last week?

## 2023-09-22 NOTE — Progress Notes (Signed)
 Dear Dr. Geofm, Here is my assessment for our mutual patient, Mike Ray. Thank you for allowing me the opportunity to care for your patient. Please do not hesitate to contact me should you have any other questions. Sincerely, Dr. Eldora Blanch  Otolaryngology Clinic Note Referring provider: Dr. Geofm HPI:  Mike Ray is a 73 y.o. male kindly referred by Dr. Geofm for evaluation of sudden hearing loss on left  Initially saw [name] -- noted left ear complaints with onset around July 2025 with ear feeling like it had water in it. Avid swimmer, tried to use ear drops but no improvement; also had tinnitus and some intermittent dizziness; No antecedent event, tried cipro  and drops without improvement. Audiogram showed significant asymmetry and [name] put him on PO steroids taper and discussed IT steroids. He returns for follow up  --------------------------------------------------------- 09/22/2023 Seen in follow up. He reports that he thinks he may be hearing slightly better. We discussed above timeline and discussed options. He is quite desperate for hearing restoration and despite timeline, has done research and would like to try IT steroids in hopes of restoring his hearing. No ear pain, h/o infections, or issues. No recent illness, strokes, facial weakness  Personal or FHx of bleeding dz or anesthesia difficulty: no  Tobacco: no.   PMHx: CAD, MI, HLD, HTN  Independent Review of Additional Tests or Records:  MRI IAC 09/07/2023 independently interpreted: no noted IAC lesions; no mastoid or ME effusion noted Audio   PMH/Meds/All/SocHx/FamHx/ROS:   Past Medical History:  Diagnosis Date   Acute MI, inferolateral wall, initial episode of care (HCC) 07/30/2015   Coronary artery disease    Elevated PSA    Dr Donelle   Gilbert's syndrome    History of heat stroke 2009   History of nephrolithiasis    Hyperlipidemia    NMR 06/2009: LDL 88(1345/912), HDL 40, TG 127. Framingham Study LDL  goal =< 130    Hypertension    Osteoarthritis of left hip    hip replacement right and left     Past Surgical History:  Procedure Laterality Date   CARDIAC CATHETERIZATION     CARDIAC CATHETERIZATION N/A 07/30/2015   Procedure: Left Heart Cath and Coronary Angiography;  Surgeon: Ozell Fell, MD;  Location: [facility] INVASIVE CV LAB;  Service: Cardiovascular;  Laterality: N/A;   CARDIAC CATHETERIZATION N/A 07/30/2015   Procedure: Coronary Stent Intervention;  Surgeon: Ozell Fell, MD;  Location: [facility] INVASIVE CV LAB;  Service: Cardiovascular;  Laterality: N/A;  Distal RCA- Promus 3.50x16   COLONOSCOPY  2011   negative   INGUINAL HERNIA REPAIR Right 10/19/2017   Procedure: RIGHT  INGUINAL HERNIA REPAIR ERAS PATHWAY;  Surgeon: Vanderbilt Ned, MD;  Location: [facility];  Service: General;  Laterality: Right;  Tap block   INSERTION OF MESH Right 10/19/2017   Procedure: INSERTION OF MESH;  Surgeon: Vanderbilt Ned, MD;  Location: [facility];  Service: General;  Laterality: Right;   LITHOTRIPSY  2009   Dr.Duckett, [place]    TOTAL HIP ARTHROPLASTY  2008   left   TOTAL HIP ARTHROPLASTY  2004   right    Family History  Problem Relation Age of Onset   Diabetes Mother    Hypertension Mother    Heart attack Father 7       smoker   Prostate cancer Brother 23       CAD; stents @ 56   Hypertension Brother    Heart attack Brother 19  Coronary artery disease Brother        stents late 50s   Stroke Neg Hx    Colon cancer Neg Hx    Colon polyps Neg Hx    Esophageal cancer Neg Hx    Rectal cancer Neg Hx    Stomach cancer Neg Hx      Social Connections: Socially Integrated (06/06/2023)   Social Connection and Isolation Panel    Frequency of Communication with Friends and Family: More than three times a week    Frequency of Social Gatherings with Friends and Family: More than three times a week    Attends Religious Services: More than 4 times per year    Active  Member of [employer] or Organizations: Yes    Attends Engineer, structural: More than 4 times per year    Marital Status: Married      Current Outpatient Medications:    amLODipine  (NORVASC ) 10 MG tablet, Take 1 tablet by mouth once daily, Disp: 90 tablet, Rfl: 0   Ascorbic Acid (SUPER C COMPLEX PO), Take by mouth 2 (two) times daily., Disp: , Rfl:    aspirin  81 MG tablet, Take 81 mg by mouth daily., Disp: , Rfl:    atorvastatin  (LIPITOR ) 80 MG tablet, TAKE 1 TABLET BY MOUTH ONCE DAILY IN THE EVENING, Disp: 90 tablet, Rfl: 1   calcium  carbonate (TUMS - DOSED IN MG ELEMENTAL CALCIUM ) 500 MG chewable tablet, Chew 1 tablet by mouth daily as needed., Disp: , Rfl:    Coenzyme Q10 (COQ10) 100 MG CAPS, Take 1 capsule by mouth daily., Disp: , Rfl:    famotidine (PEPCID) 20 MG tablet, Take 20 mg by mouth daily as needed for heartburn or indigestion., Disp: , Rfl:    fluticasone  (FLONASE ) 50 MCG/ACT nasal spray, Place 2 sprays into both nostrils daily., Disp: 16 g, Rfl: 6   loratadine  (CLARITIN ) 10 MG tablet, Take 1 tablet (10 mg total) by mouth daily., Disp: 30 tablet, Rfl: 11   losartan  (COZAAR ) 100 MG tablet, TAKE 1 TABLET BY MOUTH EVERY DAY, Disp: 90 tablet, Rfl: 3   meloxicam (MOBIC) 15 MG tablet, Take 15 mg by mouth daily., Disp: , Rfl:    metoprolol  tartrate (LOPRESSOR ) 25 MG tablet, Take 1/2 (one-half) tablet by mouth twice daily, Disp: 90 tablet, Rfl: 0   Multiple Vitamin (MULTIVITAMIN) tablet, Take 1 tablet by mouth every other day.  (Patient taking differently: Take 1 tablet by mouth daily.), Disp: , Rfl:    neomycin -polymyxin-hydrocortisone  (CORTISPORIN) OTIC solution, Place 3 drops into the left ear 4 (four) times daily., Disp: 10 mL, Rfl: 0   nitroGLYCERIN  (NITROSTAT ) 0.4 MG SL tablet, Dissolve 1 tablet under the tongue every 5 minutes as needed for chest pain. Max of 3 doses, then 911, Disp: 25 tablet, Rfl: 3   predniSONE  (DELTASONE ) 10 MG tablet, Take 6 tablets (60 mg) by mouth once  daily for 7 days, then 5 tablets (50 mg) once daily for 2 days, then 4 tablets (40 mg) once daily for 2 days, then 3 tablets (30 mg) once daily for 2 days, then 2 tablets (20 mg) once daily for 2 days, then 1 tablet (10 mg) once daily for 2 days, then stop, Disp: 72 tablet, Rfl: 0   sildenafil  (REVATIO ) 20 MG tablet, TAKE 2 TO 5 TABLETS BY MOUTH AS NEEDED FOR ERECTILE DYSFUNCTION. DO NOT USE WITH NITROGLYCERIN , Disp: 90 tablet, Rfl: 6   Physical Exam:   BP 108/69 (BP Location: Left Arm, Patient  Position: Sitting, Cuff Size: Large)   Pulse 73   Ht 5' 10 (1.778 m)   Wt 216 lb (98 kg)   SpO2 94%   BMI 30.99 kg/m   Salient findings:  CN II-XII intact Given history and complaints, ear microscopy was indicated and performed for evaluation with findings as below in physical exam section and in procedures;  Bilateral EAC clear and TM intact with well pneumatized middle ear spaces Weber 512: right Rinne 512: AC > BC AD; UTA AS Anterior rhinoscopy: Septum intact; bilateral inferior turbinates without significant hypertrophy No lesions of oral cavity/oropharynx No obviously palpable neck masses/lymphadenopathy/thyromegaly No respiratory distress or stridor  Seprately Identifiable Procedures:  Prior to initiating any procedures, risks/benefits/alternatives were explained to the patient and verbal or written consent obtained.   Procedure: Bilateral ear microscopy with left transcanal transtympanic dexamethasone  injection - CPT [phone]  Pre-procedure diagnosis:  Left Sudden Sensorineural Hearing Loss Asymmetrical Sensorineural Hearing Loss Post-procedure diagnosis: same  Indication: Patient is a 73 y.o. M with left sudden sensorineural hearing loss and asymmetrical sensorineural hearing loss. We discussed possible transtympanic steroids, which patient wished to do. Risks, benefits, and alternatives of the procedure were discussed with the patient, including the risks of vertigo and tympanic  membrane perforation. Patient agreed to proceed and signed consent.  Findings: Left: aerated middle ear, successful dexamethasone  injection  Complications: None apparent  Procedure details: Patient was placed semi recumbent on the exam chair. Using the binocular operating microscope,  topical phenol was applied to the TM. Next, a transtympanic injection with a 25-gauge spinal needle was performed through the anesthetized tympanic membrane to instill 1cc of 10 mg/mL dexamethasone  into the middle ear. Tragal pump was performed. The patient was placed with the head turned to the opposite side of the injection for 15 minutes.   Patient tolerated the procedure well  - Follow up 1 week  Impression & Plans:  Jaedon Siler is a 73 y.o. male with:  1. Asymmetric SNHL (sensorineural hearing loss)   2. Sudden left hearing loss    Noted presumed left sudden HL; although he was not seen ASAP, he has tried PO steroids without much benefit. After discussion, he reports he would like to try TT steroids which was done.  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     Thank you for allowing me the opportunity to care for your patient. Please do not hesitate to contact me should you have any other questions.  Sincerely, Eldora Blanch, MD Otolaryngologist (ENT), [facility] Phone: [phone] Fax: [phone]  09/22/2023, 9:45 AM   MDM:  Level 4 - [phone] Complexity/Problems addressed: mod - chronic stable problems Data complexity: mod - independent review of notes; independent MRI interpretation - Morbidity: mod  - Prescription Drug prescribed or managed:

## 2023-09-22 NOTE — Telephone Encounter (Signed)
 LVM informing patient provider does not need him to have hearing rest done and he can cancel it.

## 2023-09-27 ENCOUNTER — Ambulatory Visit: Admitting: Audiologist

## 2023-09-29 ENCOUNTER — Ambulatory Visit (INDEPENDENT_AMBULATORY_CARE_PROVIDER_SITE_OTHER): Admitting: Otolaryngology

## 2023-09-29 ENCOUNTER — Encounter (INDEPENDENT_AMBULATORY_CARE_PROVIDER_SITE_OTHER): Payer: Self-pay | Admitting: Otolaryngology

## 2023-09-29 ENCOUNTER — Other Ambulatory Visit: Payer: Self-pay | Admitting: Internal Medicine

## 2023-09-29 VITALS — BP 126/76 | HR 70 | Ht 70.0 in | Wt 216.0 lb

## 2023-09-29 DIAGNOSIS — H903 Sensorineural hearing loss, bilateral: Secondary | ICD-10-CM

## 2023-09-29 DIAGNOSIS — H905 Unspecified sensorineural hearing loss: Secondary | ICD-10-CM | POA: Diagnosis not present

## 2023-09-29 DIAGNOSIS — H9192 Unspecified hearing loss, left ear: Secondary | ICD-10-CM

## 2023-09-29 DIAGNOSIS — H9122 Sudden idiopathic hearing loss, left ear: Secondary | ICD-10-CM

## 2023-09-29 NOTE — Progress Notes (Signed)
 Dear Dr. Geofm, Here is my assessment for our mutual patient, Mike Ray. Thank you for allowing me the opportunity to care for your patient. Please do not hesitate to contact me should you have any other questions. Sincerely, Dr. Eldora Blanch  Otolaryngology Clinic Note Referring provider: Dr. Geofm HPI:  Mike Ray is a 73 y.o. male kindly referred by Dr. Geofm for evaluation of sudden hearing loss on left  Initially saw [name] -- noted left ear complaints with onset around July 2025 with ear feeling like it had water in it. Avid swimmer, tried to use ear drops but no improvement; also had tinnitus and some intermittent dizziness; No antecedent event, tried cipro  and drops without improvement. Audiogram showed significant asymmetry and [name] put him on PO steroids taper and discussed IT steroids. He returns for follow up  --------------------------------------------------------- 09/22/2023 Seen in follow up. He reports that he thinks he may be hearing slightly better. We discussed above timeline and discussed options. He is quite desperate for hearing restoration and despite timeline, has done research and would like to try IT steroids in hopes of restoring his hearing. No ear pain, h/o infections, or issues. No recent illness, strokes, facial weakness  --------------------------------------------------------- 09/29/2023 Returns for follow up. Hearing/hollow sound slightly better, but dizziness some worse. He did well with the last round of injection. No pain, no facial weakness.   Personal or FHx of bleeding dz or anesthesia difficulty: no  Tobacco: no.   PMHx: CAD, MI, HLD, HTN  Independent Review of Additional Tests or Records:  MRI IAC 09/07/2023 independently interpreted: no noted IAC lesions; no mastoid or ME effusion noted Audio   PMH/Meds/All/SocHx/FamHx/ROS:   Past Medical History:  Diagnosis Date   Acute MI, inferolateral wall, initial episode of care (HCC)  07/30/2015   Coronary artery disease    Elevated PSA    Dr Donelle   Gilbert's syndrome    History of heat stroke 2009   History of nephrolithiasis    Hyperlipidemia    NMR 06/2009: LDL 88(1345/912), HDL 40, TG 127. Framingham Study LDL goal =< 130    Hypertension    Osteoarthritis of left hip    hip replacement right and left     Past Surgical History:  Procedure Laterality Date   CARDIAC CATHETERIZATION     CARDIAC CATHETERIZATION N/A 07/30/2015   Procedure: Left Heart Cath and Coronary Angiography;  Surgeon: Ozell Fell, MD;  Location: [facility] INVASIVE CV LAB;  Service: Cardiovascular;  Laterality: N/A;   CARDIAC CATHETERIZATION N/A 07/30/2015   Procedure: Coronary Stent Intervention;  Surgeon: Ozell Fell, MD;  Location: [facility] INVASIVE CV LAB;  Service: Cardiovascular;  Laterality: N/A;  Distal RCA- Promus 3.50x16   COLONOSCOPY  2011   negative   INGUINAL HERNIA REPAIR Right 10/19/2017   Procedure: RIGHT  INGUINAL HERNIA REPAIR ERAS PATHWAY;  Surgeon: Vanderbilt Ned, MD;  Location: [facility];  Service: General;  Laterality: Right;  Tap block   INSERTION OF MESH Right 10/19/2017   Procedure: INSERTION OF MESH;  Surgeon: Vanderbilt Ned, MD;  Location: [facility];  Service: General;  Laterality: Right;   LITHOTRIPSY  2009   Dr.Duckett, [place]    TOTAL HIP ARTHROPLASTY  2008   left   TOTAL HIP ARTHROPLASTY  2004   right    Family History  Problem Relation Age of Onset   Diabetes Mother    Hypertension Mother    Heart attack Father 38  smoker   Prostate cancer Brother 71       CAD; stents @ 56   Hypertension Brother    Heart attack Brother 3   Coronary artery disease Brother        stents late 64s   Stroke Neg Hx    Colon cancer Neg Hx    Colon polyps Neg Hx    Esophageal cancer Neg Hx    Rectal cancer Neg Hx    Stomach cancer Neg Hx      Social Connections: Socially Integrated (06/06/2023)   Social Connection and Isolation Panel     Frequency of Communication with Friends and Family: More than three times a week    Frequency of Social Gatherings with Friends and Family: More than three times a week    Attends Religious Services: More than 4 times per year    Active Member of [employer] or Organizations: Yes    Attends Engineer, structural: More than 4 times per year    Marital Status: Married      Current Outpatient Medications:    amLODipine  (NORVASC ) 10 MG tablet, Take 1 tablet by mouth once daily, Disp: 90 tablet, Rfl: 0   Ascorbic Acid (SUPER C COMPLEX PO), Take by mouth 2 (two) times daily., Disp: , Rfl:    aspirin  81 MG tablet, Take 81 mg by mouth daily., Disp: , Rfl:    atorvastatin  (LIPITOR ) 80 MG tablet, TAKE 1 TABLET BY MOUTH ONCE DAILY IN THE EVENING, Disp: 90 tablet, Rfl: 1   calcium  carbonate (TUMS - DOSED IN MG ELEMENTAL CALCIUM ) 500 MG chewable tablet, Chew 1 tablet by mouth daily as needed., Disp: , Rfl:    Coenzyme Q10 (COQ10) 100 MG CAPS, Take 1 capsule by mouth daily., Disp: , Rfl:    famotidine (PEPCID) 20 MG tablet, Take 20 mg by mouth daily as needed for heartburn or indigestion., Disp: , Rfl:    fluticasone  (FLONASE ) 50 MCG/ACT nasal spray, Place 2 sprays into both nostrils daily., Disp: 16 g, Rfl: 6   loratadine  (CLARITIN ) 10 MG tablet, Take 1 tablet (10 mg total) by mouth daily., Disp: 30 tablet, Rfl: 11   losartan  (COZAAR ) 100 MG tablet, TAKE 1 TABLET BY MOUTH EVERY DAY, Disp: 90 tablet, Rfl: 3   meloxicam (MOBIC) 15 MG tablet, Take 15 mg by mouth daily., Disp: , Rfl:    metoprolol  tartrate (LOPRESSOR ) 25 MG tablet, Take 1/2 (one-half) tablet by mouth twice daily, Disp: 90 tablet, Rfl: 0   Multiple Vitamin (MULTIVITAMIN) tablet, Take 1 tablet by mouth every other day.  (Patient taking differently: Take 1 tablet by mouth daily.), Disp: , Rfl:    neomycin -polymyxin-hydrocortisone  (CORTISPORIN) OTIC solution, Place 3 drops into the left ear 4 (four) times daily., Disp: 10 mL, Rfl: 0    nitroGLYCERIN  (NITROSTAT ) 0.4 MG SL tablet, Dissolve 1 tablet under the tongue every 5 minutes as needed for chest pain. Max of 3 doses, then 911, Disp: 25 tablet, Rfl: 3   predniSONE  (DELTASONE ) 10 MG tablet, Take 6 tablets (60 mg) by mouth once daily for 7 days, then 5 tablets (50 mg) once daily for 2 days, then 4 tablets (40 mg) once daily for 2 days, then 3 tablets (30 mg) once daily for 2 days, then 2 tablets (20 mg) once daily for 2 days, then 1 tablet (10 mg) once daily for 2 days, then stop, Disp: 72 tablet, Rfl: 0   sildenafil  (REVATIO ) 20 MG tablet, TAKE 2 TO 5 TABLETS  BY MOUTH AS NEEDED FOR ERECTILE DYSFUNCTION. DO NOT USE WITH NITROGLYCERIN , Disp: 90 tablet, Rfl: 6   Physical Exam:   BP 126/76 (BP Location: Left Arm, Patient Position: Sitting, Cuff Size: Large)   Pulse 70   Ht 5' 10 (1.778 m)   Wt 216 lb (98 kg)   SpO2 93%   BMI 30.99 kg/m   Salient findings:  CN II-XII intact Given history and complaints, ear microscopy was indicated and performed for evaluation with findings as below in physical exam section and in procedures;  Bilateral EAC clear and TM intact with well pneumatized middle ear spaces except for small pinpoint left ear perforation site which is healing Weber 512: right Rinne 512: AC > BC AD; UTA AS No respiratory distress or stridor  Seprately Identifiable Procedures:  Prior to initiating any procedures, risks/benefits/alternatives were explained to the patient and verbal or written consent obtained.   Procedure: Bilateral ear microscopy with left transcanal transtympanic dexamethasone  injection - CPT [id number]  Pre-procedure diagnosis:  Left Sudden Sensorineural Hearing Loss Asymmetrical Sensorineural Hearing Loss Post-procedure diagnosis: same  Indication: Patient is a 73 y.o. M with left sudden sensorineural hearing loss and asymmetrical sensorineural hearing loss. We discussed possible transtympanic steroids, which patient wished to do. He has undergone 1  round of injections and is agreeable for second dose. Risks, benefits, and alternatives of the procedure were discussed with the patient, including the risks of vertigo and tympanic membrane perforation. Patient agreed to proceed and signed consent.  Findings: Left: aerated middle ear, persistent small pinpoint perforation; successful dexamethasone  injection  Complications: None apparent  Procedure details: Patient was placed semi recumbent on the exam chair. Using the binocular operating microscope,  topical phenol was applied to the TM. Next, a transtympanic injection with a 25-gauge spinal needle was performed through the anesthetized tympanic membrane to instill 1cc of 10 mg/mL dexamethasone  into the middle ear. Tragal pump was performed. The patient was placed with the head turned to the opposite side of the injection for 15 minutes.   Patient tolerated the procedure well  Impression & Plans:  Jilberto Vanderwall is a 73 y.o. male with:  1. Asymmetric SNHL (sensorineural hearing loss)   2. Sudden left hearing loss   3. Hearing loss of left ear, unspecified hearing loss type    Noted presumed left sudden HL; although he was not seen ASAP, he has tried PO steroids without much benefit. 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     Thank you for allowing me the opportunity to care for your patient. Please do not hesitate to contact me should you have any other questions.  Sincerely, Eldora Blanch, MD Otolaryngologist (ENT), [facility] Phone: [phone] Fax: [phone]  09/29/2023, 9:32 AM   MDM:  Level 3:  99213 Complexity/Problems addressed: low Data complexity: low - Morbidity: low  - Prescription Drug prescribed or managed: no

## 2023-09-30 ENCOUNTER — Encounter: Payer: Self-pay | Admitting: Audiology

## 2023-10-06 ENCOUNTER — Ambulatory Visit (INDEPENDENT_AMBULATORY_CARE_PROVIDER_SITE_OTHER): Admitting: Otolaryngology

## 2023-10-06 ENCOUNTER — Encounter (INDEPENDENT_AMBULATORY_CARE_PROVIDER_SITE_OTHER): Payer: Self-pay | Admitting: Otolaryngology

## 2023-10-06 ENCOUNTER — Ambulatory Visit (INDEPENDENT_AMBULATORY_CARE_PROVIDER_SITE_OTHER): Admitting: Audiology

## 2023-10-06 VITALS — BP 124/78 | HR 70 | Ht 70.0 in | Wt 216.0 lb

## 2023-10-06 DIAGNOSIS — H9041 Sensorineural hearing loss, unilateral, right ear, with unrestricted hearing on the contralateral side: Secondary | ICD-10-CM | POA: Diagnosis not present

## 2023-10-06 DIAGNOSIS — H905 Unspecified sensorineural hearing loss: Secondary | ICD-10-CM

## 2023-10-06 DIAGNOSIS — H9072 Mixed conductive and sensorineural hearing loss, unilateral, left ear, with unrestricted hearing on the contralateral side: Secondary | ICD-10-CM

## 2023-10-06 DIAGNOSIS — H903 Sensorineural hearing loss, bilateral: Secondary | ICD-10-CM

## 2023-10-06 DIAGNOSIS — H9122 Sudden idiopathic hearing loss, left ear: Secondary | ICD-10-CM

## 2023-10-06 DIAGNOSIS — H9192 Unspecified hearing loss, left ear: Secondary | ICD-10-CM

## 2023-10-06 NOTE — Progress Notes (Signed)
 Dear Dr. Geofm, Here is my assessment for our mutual patient, Mike Ray. Thank you for allowing me the opportunity to care for your patient. Please do not hesitate to contact me should you have any other questions. Sincerely, Dr. Eldora Blanch  Otolaryngology Clinic Note Referring provider: Dr. Geofm HPI:  Mike Ray is a 73 y.o. male kindly referred by Dr. Geofm for evaluation of sudden hearing loss on left  Initially saw [name] -- noted left ear complaints with onset around July 2025 with ear feeling like it had water in it. Avid swimmer, tried to use ear drops but no improvement; also had tinnitus and some intermittent dizziness; No antecedent event, tried cipro  and drops without improvement. Audiogram showed significant asymmetry and [name] put him on PO steroids taper and discussed IT steroids. He returns for follow up  --------------------------------------------------------- 09/22/2023 Seen in follow up. He reports that he thinks he may be hearing slightly better. We discussed above timeline and discussed options. He is quite desperate for hearing restoration and despite timeline, has done research and would like to try IT steroids in hopes of restoring his hearing. No ear pain, h/o infections, or issues. No recent illness, strokes, facial weakness  --------------------------------------------------------- 09/29/2023 Returns for follow up. Hearing/hollow sound slightly better, but dizziness some worse. He did well with the last round of injection. No pain, no facial weakness.  --------------------------------------------------------- 10/06/2023 Seen in follow up. Hearing is about the same, still intermittent dizziness. No pain, facial weakness.   Personal or FHx of bleeding dz or anesthesia difficulty: no  Tobacco: no.   PMHx: CAD, MI, HLD, HTN  Independent Review of Additional Tests or Records:  MRI IAC 09/07/2023 independently interpreted: no noted IAC lesions; no mastoid  or ME effusion noted Audio   09/2023 Audiogram was independently reviewed and interpreted by me and it reveals - essentially stable hearing thresholds compared to 09/19/2023. No significant recovery in left ear compared to audio 09/19/2023.    SNHL= Sensorineural hearing loss   PMH/Meds/All/SocHx/FamHx/ROS:   Past Medical History:  Diagnosis Date   Acute MI, inferolateral wall, initial episode of care (HCC) 07/30/2015   Coronary artery disease    Elevated PSA    Dr Donelle   Gilbert's syndrome    History of heat stroke 2009   History of nephrolithiasis    Hyperlipidemia    NMR 06/2009: LDL 88(1345/912), HDL 40, TG 127. Framingham Study LDL goal =< 130    Hypertension    Osteoarthritis of left hip    hip replacement right and left     Past Surgical History:  Procedure Laterality Date   CARDIAC CATHETERIZATION     CARDIAC CATHETERIZATION N/A 07/30/2015   Procedure: Left Heart Cath and Coronary Angiography;  Surgeon: Ozell Fell, MD;  Location: [facility] INVASIVE CV LAB;  Service: Cardiovascular;  Laterality: N/A;   CARDIAC CATHETERIZATION N/A 07/30/2015   Procedure: Coronary Stent Intervention;  Surgeon: Ozell Fell, MD;  Location[facility] INVASIVE CV LAB;  Service: Cardiovascular;  Laterality: N/A;  Distal RCA- Promus 3.50x16   COLONOSCOPY  2011   negative   INGUINAL HERNIA REPAIR Right 10/19/2017   Procedure: RIGHT  INGUINAL HERNIA REPAIR ERAS PATHWAY;  Surgeon: Vanderbilt Ned, MD;  Location: [facility];  Service: General;  Laterality: Right;  Tap block   INSERTION OF MESH Right 10/19/2017   Procedure: INSERTION OF MESH;  Surgeon: Vanderbilt Ned, MD;  Location: [facility];  Service: General;  Laterality: Right;   LITHOTRIPSY  2009  Dr.Duckett, [place]    TOTAL HIP ARTHROPLASTY  2008   left   TOTAL HIP ARTHROPLASTY  2004   right    Family History  Problem Relation Age of Onset   Diabetes Mother    Hypertension Mother    Heart attack Father 66        smoker   Prostate cancer Brother 38       CAD; stents @ 49   Hypertension Brother    Heart attack Brother 24   Coronary artery disease Brother        stents late 65s   Stroke Neg Hx    Colon cancer Neg Hx    Colon polyps Neg Hx    Esophageal cancer Neg Hx    Rectal cancer Neg Hx    Stomach cancer Neg Hx      Social Connections: Socially Integrated (06/06/2023)   Social Connection and Isolation Panel    Frequency of Communication with Friends and Family: More than three times a week    Frequency of Social Gatherings with Friends and Family: More than three times a week    Attends Religious Services: More than 4 times per year    Active Member of [employer] or Organizations: Yes    Attends Engineer, structural: More than 4 times per year    Marital Status: Married      Current Outpatient Medications:    amLODipine  (NORVASC ) 10 MG tablet, Take 1 tablet by mouth once daily, Disp: 90 tablet, Rfl: 0   Ascorbic Acid (SUPER C COMPLEX PO), Take by mouth 2 (two) times daily., Disp: , Rfl:    aspirin  81 MG tablet, Take 81 mg by mouth daily., Disp: , Rfl:    atorvastatin  (LIPITOR ) 80 MG tablet, TAKE 1 TABLET BY MOUTH ONCE DAILY IN THE EVENING, Disp: 90 tablet, Rfl: 0   calcium  carbonate (TUMS - DOSED IN MG ELEMENTAL CALCIUM ) 500 MG chewable tablet, Chew 1 tablet by mouth daily as needed., Disp: , Rfl:    Coenzyme Q10 (COQ10) 100 MG CAPS, Take 1 capsule by mouth daily., Disp: , Rfl:    famotidine (PEPCID) 20 MG tablet, Take 20 mg by mouth daily as needed for heartburn or indigestion., Disp: , Rfl:    fluticasone  (FLONASE ) 50 MCG/ACT nasal spray, Place 2 sprays into both nostrils daily., Disp: 16 g, Rfl: 6   loratadine  (CLARITIN ) 10 MG tablet, Take 1 tablet (10 mg total) by mouth daily., Disp: 30 tablet, Rfl: 11   losartan  (COZAAR ) 100 MG tablet, TAKE 1 TABLET BY MOUTH EVERY DAY, Disp: 90 tablet, Rfl: 3   meloxicam (MOBIC) 15 MG tablet, Take 15 mg by mouth daily., Disp: , Rfl:     metoprolol  tartrate (LOPRESSOR ) 25 MG tablet, Take 1/2 (one-half) tablet by mouth twice daily, Disp: 90 tablet, Rfl: 0   Multiple Vitamin (MULTIVITAMIN) tablet, Take 1 tablet by mouth every other day.  (Patient taking differently: Take 1 tablet by mouth daily.), Disp: , Rfl:    neomycin -polymyxin-hydrocortisone  (CORTISPORIN) OTIC solution, Place 3 drops into the left ear 4 (four) times daily., Disp: 10 mL, Rfl: 0   nitroGLYCERIN  (NITROSTAT ) 0.4 MG SL tablet, Dissolve 1 tablet under the tongue every 5 minutes as needed for chest pain. Max of 3 doses, then 911, Disp: 25 tablet, Rfl: 3   predniSONE  (DELTASONE ) 10 MG tablet, Take 6 tablets (60 mg) by mouth once daily for 7 days, then 5 tablets (50 mg) once daily for 2 days, then 4  tablets (40 mg) once daily for 2 days, then 3 tablets (30 mg) once daily for 2 days, then 2 tablets (20 mg) once daily for 2 days, then 1 tablet (10 mg) once daily for 2 days, then stop, Disp: 72 tablet, Rfl: 0   sildenafil  (REVATIO ) 20 MG tablet, TAKE 2 TO 5 TABLETS BY MOUTH AS NEEDED FOR ERECTILE DYSFUNCTION. DO NOT USE WITH NITROGLYCERIN , Disp: 90 tablet, Rfl: 6   Physical Exam:   BP 124/78 (BP Location: Left Arm, Patient Position: Sitting, Cuff Size: Large)   Pulse 70   Ht 5' 10 (1.778 m)   Wt 216 lb (98 kg)   SpO2 97%   BMI 30.99 kg/m   Salient findings:  CN II-XII intact Bilateral EAC clear and TM intact with well pneumatized middle ear spaces except for small pinpoint left ear perforation site which is healing Weber 512: right Rinne 512: AC > BC AD; UTA AS No respiratory distress or stridor  Seprately Identifiable Procedures:  Prior to initiating any procedures, risks/benefits/alternatives were explained to the patient and verbal or written consent obtained.  Impression & Plans:  Cleave Ternes is a 73 y.o. male with:  1. Asymmetric SNHL (sensorineural hearing loss)   2. Sudden left hearing loss   3. Hearing loss of left ear, unspecified hearing loss  type    Noted presumed left sudden HL; although he was not seen ASAP, he has tried PO steroids and now Transtympnaic steroid injections without significant recovery.  We discussed options: CI v/s CROS -- he is interested in CROS and given resource sheet.  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     Thank you for allowing me the opportunity to care for your patient. Please do not hesitate to contact me should you have any other questions.  Sincerely, Eldora Blanch, MD Otolaryngologist (ENT), [facility] Phone: [phone] Fax: [phone]  10/06/2023, 12:35 PM   I have personally spent 30 minutes involved in face-to-face and non-face-to-face activities for this patient on the day of the visit.  Professional time spent excludes any procedures performed but includes the following activities, in addition to those noted in the documentation: preparing to see the patient (review of outside documentation and results), performing a medically appropriate examination, extensive counseling, documenting in the electronic health record

## 2023-10-07 NOTE — Progress Notes (Signed)
  [address] [place], [place] [address] [phone]  Audiological Evaluation    Name: Mike Ray     DOB:   [birth year]      MRN:   [id number]                                                                                     Service Date: 10/07/2023     Accompanied by: unaccompanied   Patient comes today after Reyes Cohen, PA-C sent a referral for a hearing evaluation due to concerns with hearing loss asymmetry.   Symptoms Yes Details  Hearing loss  [x]  Follow up after sudden left SNHL. Has been treated with intratympanic shots.   Tinnitus  [x]  Water sound in the  left ear  Ear pain/ infections/pressure  []    Balance problems  [x]  Still feels dizzy- dizzy spell last Friday  Noise exposure history  []    Previous ear surgeries  []    Family history of hearing loss  []    Amplification  []    Other  []      Otoscopy: Right ear: Clear external ear canal and notable landmarks visualized on the tympanic membrane. Left ear:  Clear external ear canal and notable landmarks visualized on the tympanic membrane.  Tympanometry: Deferred testing today, patient has been having intratympanic shots.   Pure tone Audiometry: Right ear- Normal hearing from 301 023 2721 Hz, then moderate to moderately severe sensorineural hearing loss from 125 Hz - 8000 Hz. Left ear-  Moderate to profound mixed hearing loss from 125 Hz - 8000 Hz.  Speech Audiometry: Right ear- Speech Reception Threshold (SRT) was obtained at 15 dBHL. Left ear-Speech Awareness Threshold (SAT) was observed at 70 dBHL, with contralateral masking.   Word Recognition Score Tested using NU-6 (recorded) Right ear: 100% was obtained at a presentation level of 70 dBHL with contralateral masking which is deemed as  excellent. Left ear: Could not test due to degree of hearing loss.   The hearing test results were completed under headphones and results are deemed to be of good reliability. Test technique:   conventional    Impression: There is a significant difference in pure-tone thresholds between ears.   Recommendations: Follow up with ENT as scheduled for today. Return for a hearing evaluation if concerns with hearing changes arise or per MD recommendation. Consider a communication needs assessment after medical clearance for hearing aids is obtained.   Mike Ray, AUD

## 2023-11-27 ENCOUNTER — Other Ambulatory Visit: Payer: Self-pay | Admitting: Internal Medicine

## 2023-11-28 DIAGNOSIS — Z23 Encounter for immunization: Secondary | ICD-10-CM | POA: Diagnosis not present

## 2023-12-08 ENCOUNTER — Other Ambulatory Visit: Payer: Self-pay | Admitting: Internal Medicine

## 2023-12-16 DIAGNOSIS — Z96641 Presence of right artificial hip joint: Secondary | ICD-10-CM | POA: Diagnosis not present

## 2023-12-16 DIAGNOSIS — M25551 Pain in right hip: Secondary | ICD-10-CM | POA: Diagnosis not present

## 2023-12-23 ENCOUNTER — Other Ambulatory Visit: Payer: Self-pay | Admitting: Internal Medicine

## 2023-12-23 DIAGNOSIS — R2681 Unsteadiness on feet: Secondary | ICD-10-CM | POA: Diagnosis not present

## 2023-12-23 DIAGNOSIS — H7292 Unspecified perforation of tympanic membrane, left ear: Secondary | ICD-10-CM | POA: Diagnosis not present

## 2023-12-23 DIAGNOSIS — H918X3 Other specified hearing loss, bilateral: Secondary | ICD-10-CM | POA: Diagnosis not present

## 2023-12-28 DIAGNOSIS — H2513 Age-related nuclear cataract, bilateral: Secondary | ICD-10-CM | POA: Diagnosis not present

## 2024-01-13 ENCOUNTER — Encounter: Payer: Self-pay | Admitting: Cardiovascular Disease

## 2024-02-10 ENCOUNTER — Other Ambulatory Visit: Payer: Self-pay | Admitting: Cardiovascular Disease

## 2024-02-24 ENCOUNTER — Other Ambulatory Visit: Payer: Self-pay | Admitting: Internal Medicine

## 2024-02-29 NOTE — Progress Notes (Unsigned)
 " Cardiology Office Note:    Date:  03/01/2024   ID:  Mike Ray, DOB 12-01-1950, MRN [id number]  PCP:  Geofm Glade PARAS, MD   [facility] Providers Cardiologist:  Ozell Fell, MD     Referring MD: Geofm Glade PARAS, MD       History of Present Illness:   Mike Ray is a 74 y.o. male with a hx of CAD s/p inferolateral MI in 2017 with PCI->RCA, HTN, HLD, Gilbert's syndrome presenting today for follow-up.  Most recently evaluated by Dr. Fell on 01/31/2023 where he described a sharp left chest wall pain occurring up to 20 times per day.  NM PET stress test on 03/23/2023: Findings consistent with inferior infarction with small amount of apical peri-infarct ischemia, intermediate risk due to prior infarct and reduced LVEF of 32%.  Echo was recommended to better evaluate EF.  Echo 04/14/2023: LVEF 40-45%, mildly decreased LV function, RWMA present (see echo), G1 DD, LA mildly dilated, trivial MV regurg, AV regurgitation is trivial.  He was advised to continue medical management with amlodipine , aspirin , atorvastatin , losartan , and metoprolol  tartrate.  Presents independently, doing well from a cardiovascular standpoint. He denies chest pain, palpitations, dyspnea, orthopnea, n, v,  dark/tarry/bloody stools, hematuria, syncope, edema, weight gain.  His primary health concern is related to his recent development of inner ear problems leading to hearing loss and vertigo aggravated by minimal head movements. Currently followed by ENT.  He reports his blood pressure has been well-controlled at home, averaging in the 120s/70s.  Denies use of nitroglycerin .   ROS:   Please see the history of present illness.    All other systems reviewed and are negative.     Past Medical History:  Diagnosis Date   Acute MI, inferolateral wall, initial episode of care (HCC) 07/30/2015   Coronary artery disease    Elevated PSA    Dr Donelle   Gilbert's syndrome    History of heat stroke 2009    History of nephrolithiasis    Hyperlipidemia    NMR 06/2009: LDL 88(1345/912), HDL 40, TG 127. Framingham Study LDL goal =< 130    Hypertension    Osteoarthritis of left hip    hip replacement right and left    Past Surgical History:  Procedure Laterality Date   CARDIAC CATHETERIZATION     CARDIAC CATHETERIZATION N/A 07/30/2015   Procedure: Left Heart Cath and Coronary Angiography;  Surgeon: Ozell Fell, MD;  Location: [facility] INVASIVE CV LAB;  Service: Cardiovascular;  Laterality: N/A;   CARDIAC CATHETERIZATION N/A 07/30/2015   Procedure: Coronary Stent Intervention;  Surgeon: Ozell Fell, MD;  Location: [facility] INVASIVE CV LAB;  Service: Cardiovascular;  Laterality: N/A;  Distal RCA- Promus 3.50x16   COLONOSCOPY  2011   negative   INGUINAL HERNIA REPAIR Right 10/19/2017   Procedure: RIGHT  INGUINAL HERNIA REPAIR ERAS PATHWAY;  Surgeon: Vanderbilt Ned, MD;  Location: [facility];  Service: General;  Laterality: Right;  Tap block   INSERTION OF MESH Right 10/19/2017   Procedure: INSERTION OF MESH;  Surgeon: Vanderbilt Ned, MD;  Location: [facility];  Service: General;  Laterality: Right;   LITHOTRIPSY  2009   Dr.Duckett, [place]    TOTAL HIP ARTHROPLASTY  2008   left   TOTAL HIP ARTHROPLASTY  2004   right    Current Medications: Active Medications[1]   Allergies:   Patient has no known allergies.   Social History   Socioeconomic History  Marital status: Married    Spouse name: Not on file   Number of children: 3   Years of education: Not on file   Highest education level: Not on file  Occupational History   Occupation: surveyor, minerals  Tobacco Use   Smoking status: Former    Current packs/day: 0.00    Average packs/day: 1 pack/day for 32.0 years (32.0 ttl pk-yrs)    Types: Cigarettes    Start date: 56    Quit date: 01/26/2000    Years since quitting: 24.1    Passive exposure: Past   Smokeless tobacco: Never   Tobacco comments:    Pt stopped a  few times.  Vaping Use   Vaping status: Never Used  Substance and Sexual Activity   Alcohol use: Yes    Alcohol/week: 14.0 standard drinks of alcohol    Types: 14 Cans of beer per week    Comment: 14 beers/week   Drug use: No   Sexual activity: Yes    Birth control/protection: Other-see comments  Other Topics Concern   Not on file  Social History Narrative   Married   Social Drivers of Health   Tobacco Use: Medium Risk (03/01/2024)   Patient History    Smoking Tobacco Use: Former    Smokeless Tobacco Use: Never    Passive Exposure: Past  Physicist, Medical Strain: Low Risk (06/06/2023)   Overall Financial Resource Strain (CARDIA)    Difficulty of Paying Living Expenses: Not hard at all  Food Insecurity: No Food Insecurity (06/06/2023)   Hunger Vital Sign    Worried About Running Out of Food in the Last Year: Never true    Ran Out of Food in the Last Year: Never true  Transportation Needs: No Transportation Needs (06/06/2023)   PRAPARE - Administrator, Civil Service (Medical): No    Lack of Transportation (Non-Medical): No  Physical Activity: Insufficiently Active (06/06/2023)   Exercise Vital Sign    Days of Exercise per Week: 3 days    Minutes of Exercise per Session: 40 min  Stress: No Stress Concern Present (06/06/2023)   [employer] of Occupational Health - Occupational Stress Questionnaire    Feeling of Stress : Not at all  Social Connections: Socially Integrated (06/06/2023)   Social Connection and Isolation Panel    Frequency of Communication with Friends and Family: More than three times a week    Frequency of Social Gatherings with Friends and Family: More than three times a week    Attends Religious Services: More than 4 times per year    Active Member of Clubs or Organizations: Yes    Attends Banker Meetings: More than 4 times per year    Marital Status: Married  Depression (PHQ2-9): Low Risk (06/06/2023)   Depression (PHQ2-9)     PHQ-2 Score: 1  Alcohol Screen: Low Risk (06/06/2023)   Alcohol Screen    Last Alcohol Screening Score (AUDIT): 4  Housing: Unknown (06/06/2023)   Housing Stability Vital Sign    Unable to Pay for Housing in the Last Year: No    Number of Times Moved in the Last Year: Not on file    Homeless in the Last Year: No  Utilities: Not At Risk (06/06/2023)   AHC Utilities    Threatened with loss of utilities: No  Health Literacy: Adequate Health Literacy (06/06/2023)   B1300 Health Literacy    Frequency of need for help with medical instructions: Never     Family  History: The patient's family history includes Coronary artery disease in his brother; Diabetes in his mother; Heart attack (age of onset: 31) in his father; Heart attack (age of onset: 22) in his brother; Hypertension in his brother and mother; Prostate cancer (age of onset: 41) in his brother. There is no history of Stroke, Colon cancer, Colon polyps, Esophageal cancer, Rectal cancer, or Stomach cancer.  EKGs/Labs/Other Studies Reviewed:    The following studies were reviewed today:  EKG Interpretation Date/Time:  Thursday March 01 2024 09:25:52 EST Ventricular Rate:  74 PR Interval:  216 QRS Duration:  100 QT Interval:  368 QTC Calculation: 408 R Axis:   -38  Text Interpretation: Sinus rhythm with 1st degree A-V block with occasional Premature ventricular complexes Left axis deviation Inferior infarct (cited on or before 30-Jul-2015) Possible Anterior infarct (cited on or before 31-Jan-2023) When compared with ECG of 01-Mar-2024 09:25, Premature ventricular complexes are now Present Abberant conduction is no longer Present Confirmed by Laia Wiley [phone]) on 03/01/2024 9:38:00 AM    Recent Labs: 04/05/2023: ALT 32; BUN 15; Creatinine, Ser 0.91; Potassium 4.3; Sodium 138  Recent Lipid Panel    Component Value Date/Time   CHOL 100 04/05/2023 0951   TRIG 173.0 (H) 04/05/2023 0951   HDL 33.80 (L) 04/05/2023 0951   CHOLHDL  3 04/05/2023 0951   VLDL 34.6 04/05/2023 0951   LDLCALC 32 04/05/2023 0951   LDLCALC 43 08/06/2019 0848   LDLDIRECT 49.0 03/02/2022 0847     Risk Assessment/Calculations:                Physical Exam:    VS:  BP 122/76   Pulse 75   Ht 5' 10 (1.778 m)   Wt 221 lb 3.2 oz (100.3 kg)   SpO2 93%   BMI 31.74 kg/m        Wt Readings from Last 3 Encounters:  03/01/24 221 lb 3.2 oz (100.3 kg)  10/06/23 216 lb (98 kg)  09/29/23 216 lb (98 kg)     GEN:  Well nourished, well developed in no acute distress HEENT: Normal NECK:  No carotid bruits CARDIAC:  S1-S2 normal, RRR, no murmurs, rubs, gallops RESPIRATORY:  Clear to auscultation without rales, wheezing or rhonchi  MUSCULOSKELETAL:  No edema; No deformity  SKIN: Warm and dry NEUROLOGIC:  Alert and oriented x 3 PSYCHIATRIC:  Normal affect       Assessment & Plan Coronary artery disease involving native coronary artery of native heart with angina pectoris NM PET stress test on 03/23/2023: Findings consistent with inferior infarction with small amount of apical peri-infarct ischemia, intermediate risk due to prior infarct and reduced LVEF of 32%. Echo 04/14/2023: LVEF 40-45%, mildly decreased LV function, RWMA present (see echo), G1 DD, LA mildly dilated, trivial MV regurg, AV regurgitation is trivial. EKG today: Sinus rhythm with first-degree AV block and occasional PVCs, 74 bpm, no significant change from prior studies. Patient doing well, states his previous chest pains spontaneously resolved and never returned. Denies nitroglycerin  use. Continue aspirin  EC 81 mg daily, atorvastatin  80 mg daily, Continue nitroglycerin  0.4 mg SL tab PRN chest pain every 5 minutes Continue to monitor for any new signs or symptoms Will update BMP and CBC for routine medication monitoring Essential hypertension Reports BPs well-controlled at home, averaging 120s/70s Continue amlodipine  10 mg daily, losartan  100 mg daily, Lopressor  12.5 mg  twice daily Mixed hyperlipidemia Patient is not fasting today 03/2023: LDL 32, AST 28, ALT 32 He reports he has  a follow-up with his PCP in a couple months for his annual physical and states his cholesterol will be checked then.  Advised patient to forward us  those labs after he has them done Continue atorvastatin  80 mg daily  Disposition: Follow-up in 1 year with Dr. Wonda or sooner if needed.  Proceed to the ED with any new or worsening symptoms.           Medication Adjustments/Labs and Tests Ordered: Current medicines are reviewed at length with the patient today.  Concerns regarding medicines are outlined above.  Orders Placed This Encounter  Procedures   Basic metabolic panel with GFR   CBC   EKG 12-Lead   EKG 12-Lead   Meds ordered this encounter  Medications   amLODipine  (NORVASC ) 10 MG tablet    Sig: Take 1 tablet (10 mg total) by mouth daily.    Dispense:  90 tablet    Refill:  0   atorvastatin  (LIPITOR ) 80 MG tablet    Sig: Take 1 tablet (80 mg total) by mouth every evening.    Dispense:  90 tablet    Refill:  0   losartan  (COZAAR ) 100 MG tablet    Sig: Take 1 tablet (100 mg total) by mouth daily.    Dispense:  90 tablet    Refill:  0    Pt must keep upcoming followup appt with Cardiology in February 2026 for any more refills. Thank You   metoprolol  tartrate (LOPRESSOR ) 25 MG tablet    Sig: Take 0.5 tablets (12.5 mg total) by mouth 2 (two) times daily.    Dispense:  30 tablet    Refill:  1    Needs return office visit   nitroGLYCERIN  (NITROSTAT ) 0.4 MG SL tablet    Sig: Dissolve 1 tablet under the tongue every 5 minutes as needed for chest pain. Max of 3 doses, then 911    Dispense:  25 tablet    Refill:  3    Patient Instructions  Medication Instructions:  Your refills have been sent to your preferred pharmacy.  *If you need a refill on your cardiac medications before your next appointment, please call your pharmacy*  Lab Work: BMP, CBC If you have  labs (blood work) drawn today and your tests are completely normal, you will receive your results only by: MyChart Message (if you have MyChart) OR A paper copy in the mail If you have any lab test that is abnormal or we need to change your treatment, we will call you to review the results.  Testing/Procedures: No procedures were ordered during today's visit.   Follow-Up: At [facility], you and your health needs are our priority.  As part of our continuing mission to provide you with exceptional heart care, our providers are all part of one team.  This team includes your primary Cardiologist (physician) and Advanced Practice Providers or APPs (Physician Assistants and Nurse Practitioners) who all work together to provide you with the care you need, when you need it.  Your next appointment:   1 year(s)  Provider:   Ozell Wonda, MD    We recommend signing up for the patient portal called MyChart.  Sign up information is provided on this After Visit Summary.  MyChart is used to connect with patients for Virtual Visits (Telemedicine).  Patients are able to view lab/test results, encounter notes, upcoming appointments, etc.  Non-urgent messages can be sent to your provider as well.   To learn more about what  you can do with MyChart, go to [email or web address].   Other Instructions            Signed, Miriam FORBES Shams, NP  03/01/2024 11:22 AM     [facility]     [1]  Current Meds  Medication Sig   Ascorbic Acid (SUPER C COMPLEX PO) Take by mouth 2 (two) times daily.   aspirin  81 MG tablet Take 81 mg by mouth daily.   calcium  carbonate (TUMS - DOSED IN MG ELEMENTAL CALCIUM ) 500 MG chewable tablet Chew 1 tablet by mouth daily as needed.   Coenzyme Q10 (COQ10) 100 MG CAPS Take 1 capsule by mouth daily.   Multiple Vitamin (MULTIVITAMIN) tablet Take 1 tablet by mouth every other day.  (Patient taking differently: Take 1 tablet by mouth daily.)   topiramate  (TOPAMAX) 25 MG tablet Take 25 mg by mouth at bedtime.   [DISCONTINUED] amLODipine  (NORVASC ) 10 MG tablet Take 1 tablet by mouth once daily   [DISCONTINUED] atorvastatin  (LIPITOR ) 80 MG tablet TAKE 1 TABLET BY MOUTH ONCE DAILY IN THE EVENING   [DISCONTINUED] losartan  (COZAAR ) 100 MG tablet TAKE 1 TABLET BY MOUTH EVERY DAY   [DISCONTINUED] metoprolol  tartrate (LOPRESSOR ) 25 MG tablet Take 1/2 (one-half) tablet by mouth twice daily   [DISCONTINUED] nitroGLYCERIN  (NITROSTAT ) 0.4 MG SL tablet Dissolve 1 tablet under the tongue every 5 minutes as needed for chest pain. Max of 3 doses, then 911   "

## 2024-03-01 ENCOUNTER — Ambulatory Visit: Admitting: Emergency Medicine

## 2024-03-01 ENCOUNTER — Encounter: Payer: Self-pay | Admitting: Emergency Medicine

## 2024-03-01 VITALS — BP 122/76 | HR 75 | Ht 70.0 in | Wt 221.2 lb

## 2024-03-01 DIAGNOSIS — I25119 Atherosclerotic heart disease of native coronary artery with unspecified angina pectoris: Secondary | ICD-10-CM

## 2024-03-01 DIAGNOSIS — I10 Essential (primary) hypertension: Secondary | ICD-10-CM | POA: Diagnosis not present

## 2024-03-01 DIAGNOSIS — E782 Mixed hyperlipidemia: Secondary | ICD-10-CM

## 2024-03-01 LAB — CBC

## 2024-03-01 MED ORDER — ATORVASTATIN CALCIUM 80 MG PO TABS: 80.0000 mg | ORAL_TABLET | Freq: Every evening | ORAL | 0 refills | Status: AC

## 2024-03-01 MED ORDER — METOPROLOL TARTRATE 25 MG PO TABS: 12.5000 mg | ORAL_TABLET | Freq: Two times a day (BID) | ORAL | 1 refills | Status: AC

## 2024-03-01 MED ORDER — AMLODIPINE BESYLATE 10 MG PO TABS: 10.0000 mg | ORAL_TABLET | Freq: Every day | ORAL | 0 refills | Status: AC

## 2024-03-01 MED ORDER — LOSARTAN POTASSIUM 100 MG PO TABS: 100.0000 mg | ORAL_TABLET | Freq: Every day | ORAL | 0 refills | Status: AC

## 2024-03-01 MED ORDER — NITROGLYCERIN 0.4 MG SL SUBL: SUBLINGUAL_TABLET | SUBLINGUAL | 3 refills | Status: AC

## 2024-03-01 NOTE — Patient Instructions (Signed)
 Medication Instructions:  Your refills have been sent to your preferred pharmacy.  *If you need a refill on your cardiac medications before your next appointment, please call your pharmacy*  Lab Work: BMP, CBC If you have labs (blood work) drawn today and your tests are completely normal, you will receive your results only by: MyChart Message (if you have MyChart) OR A paper copy in the mail If you have any lab test that is abnormal or we need to change your treatment, we will call you to review the results.  Testing/Procedures: No procedures were ordered during today's visit.   Follow-Up: At [facility], you and your health needs are our priority.  As part of our continuing mission to provide you with exceptional heart care, our providers are all part of one team.  This team includes your primary Cardiologist (physician) and Advanced Practice Providers or APPs (Physician Assistants and Nurse Practitioners) who all work together to provide you with the care you need, when you need it.  Your next appointment:   1 year(s)  Provider:   Ozell Fell, MD    We recommend signing up for the patient portal called MyChart.  Sign up information is provided on this After Visit Summary.  MyChart is used to connect with patients for Virtual Visits (Telemedicine).  Patients are able to view lab/test results, encounter notes, upcoming appointments, etc.  Non-urgent messages can be sent to your provider as well.   To learn more about what you can do with MyChart, go to [email or web address].   Other Instructions

## 2024-03-01 NOTE — Progress Notes (Signed)
"   Order(s) created erroneously. Erroneous order ID: [id number]  Order moved by: CHART CORRECTION ANALYST SEVEN, IDENTITY  Order move date/time: 03/01/2024 8:56 PM  Source Patient: [id number]  Source Contact: 03/01/2024  Destination Patient: [id number]  Destination Contact: 07/07/2022 "

## 2024-03-01 NOTE — Assessment & Plan Note (Signed)
 Patient is not fasting today 03/2023: LDL 32, AST 28, ALT 32 He reports he has a follow-up with his PCP in a couple months for his annual physical and states his cholesterol will be checked then.  Advised patient to forward us  those labs after he has them done Continue atorvastatin  80 mg daily

## 2024-03-01 NOTE — Assessment & Plan Note (Signed)
 NM PET stress test on 03/23/2023: Findings consistent with inferior infarction with small amount of apical peri-infarct ischemia, intermediate risk due to prior infarct and reduced LVEF of 32%. Echo 04/14/2023: LVEF 40-45%, mildly decreased LV function, RWMA present (see echo), G1 DD, LA mildly dilated, trivial MV regurg, AV regurgitation is trivial. EKG today: Sinus rhythm with first-degree AV block and occasional PVCs, 74 bpm, no significant change from prior studies. Patient doing well, states his previous chest pains spontaneously resolved and never returned. Denies nitroglycerin  use. Continue aspirin  EC 81 mg daily, atorvastatin  80 mg daily, Continue nitroglycerin  0.4 mg SL tab PRN chest pain every 5 minutes Continue to monitor for any new signs or symptoms Will update BMP and CBC for routine medication monitoring

## 2024-03-01 NOTE — Assessment & Plan Note (Signed)
 Reports BPs well-controlled at home, averaging 120s/70s Continue amlodipine  10 mg daily, losartan  100 mg daily, Lopressor  12.5 mg twice daily

## 2024-03-02 ENCOUNTER — Ambulatory Visit: Payer: Self-pay | Admitting: Emergency Medicine

## 2024-03-02 LAB — BASIC METABOLIC PANEL WITH GFR
BUN/Creatinine Ratio: 17 (ref 10–24)
BUN: 17 mg/dL (ref 8–27)
CO2: 20 mmol/L (ref 20–29)
Calcium: 10 mg/dL (ref 8.6–10.2)
Chloride: 106 mmol/L (ref 96–106)
Creatinine, Ser: 0.99 mg/dL (ref 0.76–1.27)
Glucose: 116 mg/dL — ABNORMAL HIGH (ref 70–99)
Potassium: 4.5 mmol/L (ref 3.5–5.2)
Sodium: 142 mmol/L (ref 134–144)
eGFR: 80 mL/min/{1.73_m2}

## 2024-03-02 LAB — CBC
Hematocrit: 47.6 (ref 37.5–51.0)
Hemoglobin: 16.3 g/dL (ref 13.0–17.7)
MCH: 32.7 pg (ref 26.6–33.0)
MCHC: 34.2 g/dL (ref 31.5–35.7)
MCV: 95 fL (ref 79–97)
Platelets: 275 10*3/uL (ref 150–450)
RBC: 4.99 x10E6/uL (ref 4.14–5.80)
RDW: 12 (ref 11.6–15.4)
WBC: 5.2 10*3/uL (ref 3.4–10.8)

## 2024-06-11 ENCOUNTER — Ambulatory Visit
